# Patient Record
Sex: Female | Born: 1960 | Race: Black or African American | Hispanic: No | Marital: Single | Smoking: Former smoker
Health system: Southern US, Community
[De-identification: ages and names within clinical notes are randomized; demographics above are authoritative.]

## PROBLEM LIST (undated history)

## (undated) DIAGNOSIS — Z906 Acquired absence of other parts of urinary tract: Secondary | ICD-10-CM

## (undated) DIAGNOSIS — F32A Depression, unspecified: Secondary | ICD-10-CM

## (undated) DIAGNOSIS — E785 Hyperlipidemia, unspecified: Secondary | ICD-10-CM

## (undated) DIAGNOSIS — M5136 Other intervertebral disc degeneration, lumbar region: Secondary | ICD-10-CM

## (undated) DIAGNOSIS — E119 Type 2 diabetes mellitus without complications: Secondary | ICD-10-CM

## (undated) DIAGNOSIS — G4700 Insomnia, unspecified: Secondary | ICD-10-CM

## (undated) DIAGNOSIS — I10 Essential (primary) hypertension: Secondary | ICD-10-CM

## (undated) DIAGNOSIS — N951 Menopausal and female climacteric states: Secondary | ICD-10-CM

## (undated) DIAGNOSIS — M51369 Other intervertebral disc degeneration, lumbar region without mention of lumbar back pain or lower extremity pain: Secondary | ICD-10-CM

## (undated) DIAGNOSIS — E669 Obesity, unspecified: Secondary | ICD-10-CM

## (undated) DIAGNOSIS — M549 Dorsalgia, unspecified: Secondary | ICD-10-CM

## (undated) DIAGNOSIS — C801 Malignant (primary) neoplasm, unspecified: Secondary | ICD-10-CM

## (undated) DIAGNOSIS — G629 Polyneuropathy, unspecified: Secondary | ICD-10-CM

## (undated) DIAGNOSIS — N289 Disorder of kidney and ureter, unspecified: Secondary | ICD-10-CM

## (undated) DIAGNOSIS — J45909 Unspecified asthma, uncomplicated: Secondary | ICD-10-CM

## (undated) DIAGNOSIS — F329 Major depressive disorder, single episode, unspecified: Secondary | ICD-10-CM

## (undated) DIAGNOSIS — B351 Tinea unguium: Secondary | ICD-10-CM

## (undated) HISTORY — DX: Dorsalgia, unspecified: M54.9

## (undated) HISTORY — DX: Major depressive disorder, single episode, unspecified: F32.9

## (undated) HISTORY — PX: COSMETIC SURGERY: SHX468

## (undated) HISTORY — PX: JOINT REPLACEMENT: SHX530

## (undated) HISTORY — DX: Other intervertebral disc degeneration, lumbar region without mention of lumbar back pain or lower extremity pain: M51.369

## (undated) HISTORY — DX: Tinea unguium: B35.1

## (undated) HISTORY — DX: Polyneuropathy, unspecified: G62.9

## (undated) HISTORY — DX: Insomnia, unspecified: G47.00

## (undated) HISTORY — PX: SHOULDER SURGERY: SHX246

## (undated) HISTORY — DX: Essential (primary) hypertension: I10

## (undated) HISTORY — DX: Hyperlipidemia, unspecified: E78.5

## (undated) HISTORY — DX: Menopausal and female climacteric states: N95.1

## (undated) HISTORY — DX: Unspecified asthma, uncomplicated: J45.909

## (undated) HISTORY — DX: Other intervertebral disc degeneration, lumbar region: M51.36

## (undated) HISTORY — DX: Acquired absence of other parts of urinary tract: Z90.6

## (undated) HISTORY — DX: Type 2 diabetes mellitus without complications: E11.9

## (undated) HISTORY — PX: LIVER BIOPSY: SHX301

## (undated) HISTORY — DX: Depression, unspecified: F32.A

## (undated) HISTORY — DX: Obesity, unspecified: E66.9

## (undated) HISTORY — DX: Disorder of kidney and ureter, unspecified: N28.9

---

## 2003-11-12 ENCOUNTER — Ambulatory Visit: Payer: Self-pay | Admitting: Pain Medicine

## 2003-12-14 ENCOUNTER — Ambulatory Visit: Payer: Self-pay | Admitting: Pain Medicine

## 2003-12-25 ENCOUNTER — Other Ambulatory Visit: Payer: Self-pay

## 2004-01-08 ENCOUNTER — Inpatient Hospital Stay: Payer: Self-pay | Admitting: Unknown Physician Specialty

## 2004-03-01 ENCOUNTER — Encounter: Payer: Self-pay | Admitting: Physician Assistant

## 2004-03-08 ENCOUNTER — Ambulatory Visit: Payer: Self-pay | Admitting: Pain Medicine

## 2004-03-09 ENCOUNTER — Encounter: Payer: Self-pay | Admitting: Physician Assistant

## 2004-03-21 ENCOUNTER — Ambulatory Visit: Payer: Self-pay | Admitting: Pain Medicine

## 2004-04-06 ENCOUNTER — Encounter: Payer: Self-pay | Admitting: Physician Assistant

## 2004-04-14 ENCOUNTER — Ambulatory Visit: Payer: Self-pay | Admitting: Pain Medicine

## 2004-04-20 ENCOUNTER — Ambulatory Visit: Payer: Self-pay | Admitting: Pain Medicine

## 2004-05-07 ENCOUNTER — Encounter: Payer: Self-pay | Admitting: Physician Assistant

## 2004-05-12 ENCOUNTER — Ambulatory Visit: Payer: Self-pay | Admitting: Pain Medicine

## 2004-05-18 ENCOUNTER — Ambulatory Visit: Payer: Self-pay | Admitting: Pain Medicine

## 2004-06-13 ENCOUNTER — Ambulatory Visit: Payer: Self-pay | Admitting: Unknown Physician Specialty

## 2004-06-14 ENCOUNTER — Ambulatory Visit: Payer: Self-pay | Admitting: Pain Medicine

## 2004-06-22 ENCOUNTER — Ambulatory Visit: Payer: Self-pay | Admitting: Pain Medicine

## 2004-07-12 ENCOUNTER — Ambulatory Visit: Payer: Self-pay | Admitting: Pain Medicine

## 2004-07-18 ENCOUNTER — Ambulatory Visit: Payer: Self-pay | Admitting: Pain Medicine

## 2004-08-11 ENCOUNTER — Ambulatory Visit: Payer: Self-pay | Admitting: Pain Medicine

## 2004-08-22 ENCOUNTER — Ambulatory Visit: Payer: Self-pay | Admitting: Pain Medicine

## 2004-09-06 ENCOUNTER — Ambulatory Visit: Payer: Self-pay | Admitting: Pain Medicine

## 2004-09-28 ENCOUNTER — Ambulatory Visit: Payer: Self-pay | Admitting: Pain Medicine

## 2004-11-08 ENCOUNTER — Ambulatory Visit: Payer: Self-pay | Admitting: Pain Medicine

## 2004-11-14 ENCOUNTER — Ambulatory Visit: Payer: Self-pay | Admitting: Pain Medicine

## 2004-11-15 ENCOUNTER — Encounter: Payer: Self-pay | Admitting: Pain Medicine

## 2004-11-16 ENCOUNTER — Ambulatory Visit: Payer: Self-pay | Admitting: Pain Medicine

## 2004-12-06 ENCOUNTER — Ambulatory Visit: Payer: Self-pay | Admitting: Pain Medicine

## 2004-12-12 ENCOUNTER — Ambulatory Visit: Payer: Self-pay | Admitting: Pain Medicine

## 2004-12-15 ENCOUNTER — Encounter: Payer: Self-pay | Admitting: Pain Medicine

## 2005-01-03 ENCOUNTER — Ambulatory Visit: Payer: Self-pay | Admitting: Pain Medicine

## 2005-01-09 ENCOUNTER — Ambulatory Visit: Payer: Self-pay | Admitting: Pain Medicine

## 2005-01-10 ENCOUNTER — Encounter: Payer: Self-pay | Admitting: Pain Medicine

## 2005-02-06 ENCOUNTER — Encounter: Payer: Self-pay | Admitting: Pain Medicine

## 2005-02-16 ENCOUNTER — Ambulatory Visit: Payer: Self-pay | Admitting: Pain Medicine

## 2005-02-27 ENCOUNTER — Ambulatory Visit: Payer: Self-pay | Admitting: Pain Medicine

## 2005-03-14 ENCOUNTER — Ambulatory Visit: Payer: Self-pay | Admitting: Pain Medicine

## 2005-04-24 ENCOUNTER — Ambulatory Visit: Payer: Self-pay | Admitting: Pain Medicine

## 2005-05-04 ENCOUNTER — Ambulatory Visit: Payer: Self-pay | Admitting: Pain Medicine

## 2005-05-22 ENCOUNTER — Ambulatory Visit: Payer: Self-pay | Admitting: Pain Medicine

## 2005-06-01 ENCOUNTER — Ambulatory Visit: Payer: Self-pay | Admitting: Pain Medicine

## 2005-06-14 ENCOUNTER — Ambulatory Visit: Payer: Self-pay | Admitting: Pain Medicine

## 2005-06-24 ENCOUNTER — Ambulatory Visit: Payer: Self-pay | Admitting: Unknown Physician Specialty

## 2005-06-29 ENCOUNTER — Ambulatory Visit: Payer: Self-pay | Admitting: Pain Medicine

## 2005-07-24 ENCOUNTER — Ambulatory Visit: Payer: Self-pay | Admitting: Unknown Physician Specialty

## 2005-08-01 ENCOUNTER — Ambulatory Visit: Payer: Self-pay | Admitting: Pain Medicine

## 2005-08-21 ENCOUNTER — Ambulatory Visit: Payer: Self-pay | Admitting: Pain Medicine

## 2005-09-26 ENCOUNTER — Ambulatory Visit: Payer: Self-pay | Admitting: Pain Medicine

## 2005-10-26 ENCOUNTER — Ambulatory Visit: Payer: Self-pay | Admitting: Pain Medicine

## 2005-11-09 ENCOUNTER — Other Ambulatory Visit: Payer: Self-pay

## 2005-11-23 ENCOUNTER — Observation Stay: Payer: Self-pay | Admitting: Unknown Physician Specialty

## 2006-01-02 ENCOUNTER — Ambulatory Visit: Payer: Self-pay | Admitting: Pain Medicine

## 2006-01-18 ENCOUNTER — Ambulatory Visit: Payer: Self-pay | Admitting: Pain Medicine

## 2006-02-07 ENCOUNTER — Ambulatory Visit: Payer: Self-pay | Admitting: Pain Medicine

## 2006-02-12 ENCOUNTER — Encounter: Payer: Self-pay | Admitting: Unknown Physician Specialty

## 2006-02-15 ENCOUNTER — Ambulatory Visit: Payer: Self-pay | Admitting: Family Medicine

## 2006-02-28 ENCOUNTER — Ambulatory Visit: Payer: Self-pay | Admitting: Pain Medicine

## 2006-03-09 ENCOUNTER — Encounter: Payer: Self-pay | Admitting: Unknown Physician Specialty

## 2006-03-15 ENCOUNTER — Ambulatory Visit: Payer: Self-pay | Admitting: Pain Medicine

## 2006-04-07 ENCOUNTER — Encounter: Payer: Self-pay | Admitting: Unknown Physician Specialty

## 2006-04-11 ENCOUNTER — Ambulatory Visit: Payer: Self-pay | Admitting: Pain Medicine

## 2006-05-15 ENCOUNTER — Ambulatory Visit: Payer: Self-pay | Admitting: Pain Medicine

## 2006-05-28 ENCOUNTER — Ambulatory Visit: Payer: Self-pay | Admitting: Pain Medicine

## 2006-06-20 ENCOUNTER — Ambulatory Visit: Payer: Self-pay | Admitting: Pain Medicine

## 2006-07-24 ENCOUNTER — Ambulatory Visit: Payer: Self-pay | Admitting: Pain Medicine

## 2006-08-01 ENCOUNTER — Ambulatory Visit: Payer: Self-pay | Admitting: Pain Medicine

## 2006-08-13 ENCOUNTER — Ambulatory Visit: Payer: Self-pay | Admitting: Pain Medicine

## 2006-08-23 ENCOUNTER — Ambulatory Visit: Payer: Self-pay | Admitting: Pain Medicine

## 2006-09-10 ENCOUNTER — Ambulatory Visit: Payer: Self-pay | Admitting: Pain Medicine

## 2006-09-19 ENCOUNTER — Ambulatory Visit: Payer: Self-pay | Admitting: Pain Medicine

## 2006-10-02 ENCOUNTER — Ambulatory Visit: Payer: Self-pay | Admitting: Pain Medicine

## 2006-10-30 ENCOUNTER — Ambulatory Visit: Payer: Self-pay | Admitting: Unknown Physician Specialty

## 2006-10-30 ENCOUNTER — Other Ambulatory Visit: Payer: Self-pay

## 2006-10-31 ENCOUNTER — Ambulatory Visit: Payer: Self-pay | Admitting: Pain Medicine

## 2006-11-05 ENCOUNTER — Ambulatory Visit: Payer: Self-pay | Admitting: Unknown Physician Specialty

## 2006-11-10 ENCOUNTER — Other Ambulatory Visit: Payer: Self-pay

## 2006-11-10 ENCOUNTER — Emergency Department: Payer: Self-pay | Admitting: Emergency Medicine

## 2006-11-29 ENCOUNTER — Ambulatory Visit: Payer: Self-pay | Admitting: Pain Medicine

## 2006-12-27 ENCOUNTER — Ambulatory Visit: Payer: Self-pay | Admitting: Pain Medicine

## 2007-01-09 ENCOUNTER — Ambulatory Visit: Payer: Self-pay | Admitting: Pain Medicine

## 2007-01-28 ENCOUNTER — Ambulatory Visit: Payer: Self-pay | Admitting: Pain Medicine

## 2007-02-28 ENCOUNTER — Ambulatory Visit: Payer: Self-pay | Admitting: Pain Medicine

## 2007-03-11 ENCOUNTER — Ambulatory Visit: Payer: Self-pay | Admitting: Pain Medicine

## 2007-03-21 ENCOUNTER — Ambulatory Visit: Payer: Self-pay | Admitting: Family Medicine

## 2007-03-26 ENCOUNTER — Ambulatory Visit: Payer: Self-pay | Admitting: Pain Medicine

## 2007-04-01 ENCOUNTER — Ambulatory Visit: Payer: Self-pay | Admitting: Pain Medicine

## 2007-04-30 ENCOUNTER — Ambulatory Visit: Payer: Self-pay | Admitting: Pain Medicine

## 2007-06-05 ENCOUNTER — Ambulatory Visit: Payer: Self-pay | Admitting: Pain Medicine

## 2007-07-02 ENCOUNTER — Ambulatory Visit: Payer: Self-pay | Admitting: Pain Medicine

## 2007-07-17 ENCOUNTER — Ambulatory Visit: Payer: Self-pay | Admitting: Pain Medicine

## 2007-08-27 ENCOUNTER — Ambulatory Visit: Payer: Self-pay | Admitting: Pain Medicine

## 2007-09-26 ENCOUNTER — Ambulatory Visit: Payer: Self-pay | Admitting: Pain Medicine

## 2007-10-02 ENCOUNTER — Ambulatory Visit: Payer: Self-pay | Admitting: Pain Medicine

## 2007-10-31 ENCOUNTER — Ambulatory Visit: Payer: Self-pay | Admitting: Pain Medicine

## 2007-11-20 ENCOUNTER — Ambulatory Visit: Payer: Self-pay | Admitting: Pain Medicine

## 2007-12-31 ENCOUNTER — Ambulatory Visit: Payer: Self-pay | Admitting: Pain Medicine

## 2008-01-27 ENCOUNTER — Ambulatory Visit: Payer: Self-pay | Admitting: Pain Medicine

## 2008-01-28 ENCOUNTER — Inpatient Hospital Stay: Payer: Self-pay | Admitting: Internal Medicine

## 2008-02-27 ENCOUNTER — Ambulatory Visit: Payer: Self-pay | Admitting: Pain Medicine

## 2008-03-02 ENCOUNTER — Ambulatory Visit: Payer: Self-pay | Admitting: Pain Medicine

## 2008-03-31 ENCOUNTER — Ambulatory Visit: Payer: Self-pay | Admitting: Pain Medicine

## 2008-04-07 ENCOUNTER — Ambulatory Visit: Payer: Self-pay | Admitting: Internal Medicine

## 2008-04-28 ENCOUNTER — Ambulatory Visit: Payer: Self-pay | Admitting: Pain Medicine

## 2008-05-06 ENCOUNTER — Ambulatory Visit: Payer: Self-pay | Admitting: Pain Medicine

## 2008-05-28 ENCOUNTER — Ambulatory Visit: Payer: Self-pay | Admitting: Pain Medicine

## 2008-06-25 ENCOUNTER — Ambulatory Visit: Payer: Self-pay | Admitting: Pain Medicine

## 2008-07-27 ENCOUNTER — Ambulatory Visit: Payer: Self-pay | Admitting: Pain Medicine

## 2008-08-18 ENCOUNTER — Ambulatory Visit: Payer: Self-pay | Admitting: Orthopedic Surgery

## 2008-08-25 ENCOUNTER — Ambulatory Visit: Payer: Self-pay | Admitting: Pain Medicine

## 2008-09-02 ENCOUNTER — Ambulatory Visit: Payer: Self-pay | Admitting: Pain Medicine

## 2008-09-24 ENCOUNTER — Ambulatory Visit: Payer: Self-pay | Admitting: Pain Medicine

## 2008-10-22 ENCOUNTER — Ambulatory Visit: Payer: Self-pay | Admitting: Pain Medicine

## 2008-11-04 ENCOUNTER — Ambulatory Visit: Payer: Self-pay | Admitting: Pain Medicine

## 2008-11-19 ENCOUNTER — Ambulatory Visit: Payer: Self-pay | Admitting: Pain Medicine

## 2008-12-22 ENCOUNTER — Ambulatory Visit: Payer: Self-pay | Admitting: Pain Medicine

## 2009-01-26 ENCOUNTER — Ambulatory Visit: Payer: Self-pay | Admitting: Pain Medicine

## 2009-02-23 ENCOUNTER — Ambulatory Visit: Payer: Self-pay | Admitting: Pain Medicine

## 2009-03-17 ENCOUNTER — Ambulatory Visit: Payer: Self-pay | Admitting: Pain Medicine

## 2009-03-25 ENCOUNTER — Ambulatory Visit: Payer: Self-pay | Admitting: Pain Medicine

## 2009-03-31 ENCOUNTER — Ambulatory Visit: Payer: Self-pay | Admitting: Pain Medicine

## 2009-04-09 ENCOUNTER — Ambulatory Visit: Payer: Self-pay | Admitting: Internal Medicine

## 2009-04-21 ENCOUNTER — Ambulatory Visit: Payer: Self-pay | Admitting: Pain Medicine

## 2009-04-28 ENCOUNTER — Ambulatory Visit: Payer: Self-pay | Admitting: Gastroenterology

## 2009-05-20 ENCOUNTER — Ambulatory Visit: Payer: Self-pay | Admitting: Pain Medicine

## 2009-06-15 ENCOUNTER — Ambulatory Visit: Payer: Self-pay | Admitting: Pain Medicine

## 2009-06-23 ENCOUNTER — Ambulatory Visit: Payer: Self-pay | Admitting: Pain Medicine

## 2009-07-15 ENCOUNTER — Ambulatory Visit: Payer: Self-pay | Admitting: Pain Medicine

## 2009-08-17 ENCOUNTER — Ambulatory Visit: Payer: Self-pay | Admitting: Pain Medicine

## 2009-09-16 ENCOUNTER — Ambulatory Visit: Payer: Self-pay | Admitting: Pain Medicine

## 2009-10-18 ENCOUNTER — Ambulatory Visit: Payer: Self-pay | Admitting: Pain Medicine

## 2009-11-17 ENCOUNTER — Ambulatory Visit: Payer: Self-pay | Admitting: Pain Medicine

## 2009-12-16 ENCOUNTER — Ambulatory Visit: Payer: Self-pay | Admitting: Pain Medicine

## 2010-01-11 ENCOUNTER — Ambulatory Visit: Payer: Self-pay | Admitting: Pain Medicine

## 2010-01-19 ENCOUNTER — Ambulatory Visit: Payer: Self-pay | Admitting: Pain Medicine

## 2010-02-10 ENCOUNTER — Ambulatory Visit: Payer: Self-pay | Admitting: Pain Medicine

## 2010-02-28 ENCOUNTER — Ambulatory Visit: Payer: Self-pay | Admitting: Pain Medicine

## 2010-03-15 ENCOUNTER — Ambulatory Visit: Payer: Self-pay | Admitting: Pain Medicine

## 2010-04-11 ENCOUNTER — Ambulatory Visit: Payer: Self-pay | Admitting: Internal Medicine

## 2010-04-12 ENCOUNTER — Ambulatory Visit: Payer: Self-pay | Admitting: Pain Medicine

## 2010-05-12 ENCOUNTER — Ambulatory Visit: Payer: Self-pay | Admitting: Pain Medicine

## 2010-06-07 ENCOUNTER — Ambulatory Visit: Payer: Self-pay | Admitting: Pain Medicine

## 2010-07-11 ENCOUNTER — Ambulatory Visit: Payer: Self-pay | Admitting: Pain Medicine

## 2010-08-09 ENCOUNTER — Ambulatory Visit: Payer: Self-pay | Admitting: Pain Medicine

## 2010-09-08 ENCOUNTER — Ambulatory Visit: Payer: Self-pay | Admitting: Pain Medicine

## 2010-10-06 ENCOUNTER — Ambulatory Visit: Payer: Self-pay | Admitting: Pain Medicine

## 2010-11-02 ENCOUNTER — Ambulatory Visit: Payer: Self-pay | Admitting: Pain Medicine

## 2010-12-01 ENCOUNTER — Ambulatory Visit: Payer: Self-pay | Admitting: Pain Medicine

## 2010-12-14 ENCOUNTER — Ambulatory Visit: Payer: Self-pay | Admitting: Pain Medicine

## 2011-01-05 ENCOUNTER — Ambulatory Visit: Payer: Self-pay | Admitting: Pain Medicine

## 2011-02-08 ENCOUNTER — Ambulatory Visit: Payer: Self-pay | Admitting: Pain Medicine

## 2011-03-09 ENCOUNTER — Ambulatory Visit: Payer: Self-pay | Admitting: Pain Medicine

## 2011-03-29 ENCOUNTER — Ambulatory Visit: Payer: Self-pay | Admitting: Pain Medicine

## 2011-04-19 ENCOUNTER — Ambulatory Visit: Payer: Self-pay | Admitting: Pain Medicine

## 2011-04-28 ENCOUNTER — Ambulatory Visit: Payer: Self-pay | Admitting: Pain Medicine

## 2011-05-09 ENCOUNTER — Ambulatory Visit: Payer: Self-pay | Admitting: Pain Medicine

## 2011-05-22 ENCOUNTER — Ambulatory Visit: Payer: Self-pay | Admitting: Pain Medicine

## 2011-05-24 ENCOUNTER — Ambulatory Visit: Payer: Self-pay | Admitting: Internal Medicine

## 2011-06-08 ENCOUNTER — Ambulatory Visit: Payer: Self-pay | Admitting: Pain Medicine

## 2011-07-06 ENCOUNTER — Ambulatory Visit: Payer: Self-pay | Admitting: Pain Medicine

## 2011-08-01 ENCOUNTER — Ambulatory Visit: Payer: Self-pay | Admitting: Pain Medicine

## 2011-09-05 ENCOUNTER — Ambulatory Visit: Payer: Self-pay | Admitting: Pain Medicine

## 2011-10-05 ENCOUNTER — Ambulatory Visit: Payer: Self-pay | Admitting: Pain Medicine

## 2011-11-06 ENCOUNTER — Ambulatory Visit: Payer: Self-pay | Admitting: Pain Medicine

## 2011-12-05 ENCOUNTER — Ambulatory Visit: Payer: Self-pay | Admitting: Pain Medicine

## 2011-12-07 ENCOUNTER — Ambulatory Visit: Payer: Self-pay | Admitting: Family Medicine

## 2012-01-01 ENCOUNTER — Ambulatory Visit: Payer: Self-pay | Admitting: Pain Medicine

## 2012-01-10 ENCOUNTER — Encounter: Payer: Self-pay | Admitting: Internal Medicine

## 2012-02-06 ENCOUNTER — Ambulatory Visit: Payer: Self-pay | Admitting: Pain Medicine

## 2012-02-07 ENCOUNTER — Encounter: Payer: Self-pay | Admitting: Internal Medicine

## 2012-02-19 ENCOUNTER — Ambulatory Visit: Payer: Self-pay | Admitting: Pain Medicine

## 2012-03-07 ENCOUNTER — Ambulatory Visit: Payer: Self-pay | Admitting: Pain Medicine

## 2012-03-09 ENCOUNTER — Encounter: Payer: Self-pay | Admitting: Internal Medicine

## 2012-04-03 ENCOUNTER — Ambulatory Visit: Payer: Self-pay | Admitting: Pain Medicine

## 2012-04-30 ENCOUNTER — Ambulatory Visit: Payer: Self-pay | Admitting: Pain Medicine

## 2012-05-27 ENCOUNTER — Ambulatory Visit: Payer: Self-pay | Admitting: Internal Medicine

## 2012-05-29 ENCOUNTER — Ambulatory Visit: Payer: Self-pay | Admitting: Pain Medicine

## 2012-05-30 DIAGNOSIS — N62 Hypertrophy of breast: Secondary | ICD-10-CM | POA: Insufficient documentation

## 2012-06-04 ENCOUNTER — Ambulatory Visit: Payer: Self-pay | Admitting: Pain Medicine

## 2012-07-04 ENCOUNTER — Ambulatory Visit: Payer: Self-pay | Admitting: Pain Medicine

## 2012-07-30 ENCOUNTER — Ambulatory Visit: Payer: Self-pay | Admitting: Pain Medicine

## 2012-08-19 ENCOUNTER — Encounter: Payer: Self-pay | Admitting: Internal Medicine

## 2012-09-03 ENCOUNTER — Ambulatory Visit: Payer: Self-pay | Admitting: Pain Medicine

## 2012-09-06 ENCOUNTER — Encounter: Payer: Self-pay | Admitting: Internal Medicine

## 2012-10-03 ENCOUNTER — Ambulatory Visit: Payer: Self-pay | Admitting: Pain Medicine

## 2012-10-30 ENCOUNTER — Ambulatory Visit: Payer: Self-pay | Admitting: Pain Medicine

## 2012-11-25 ENCOUNTER — Ambulatory Visit: Payer: Self-pay | Admitting: Pain Medicine

## 2012-12-24 ENCOUNTER — Ambulatory Visit: Payer: Self-pay | Admitting: Pain Medicine

## 2013-01-28 ENCOUNTER — Ambulatory Visit: Payer: Self-pay | Admitting: Pain Medicine

## 2013-02-18 ENCOUNTER — Encounter: Payer: Self-pay | Admitting: Orthopedic Surgery

## 2013-02-21 ENCOUNTER — Emergency Department: Payer: Self-pay | Admitting: Emergency Medicine

## 2013-02-25 ENCOUNTER — Ambulatory Visit: Payer: Self-pay | Admitting: Pain Medicine

## 2013-02-27 ENCOUNTER — Emergency Department: Payer: Self-pay | Admitting: Emergency Medicine

## 2013-02-27 LAB — CBC
HCT: 38.5 % (ref 35.0–47.0)
HGB: 12.5 g/dL (ref 12.0–16.0)
MCH: 30.4 pg (ref 26.0–34.0)
MCHC: 32.4 g/dL (ref 32.0–36.0)
MCV: 94 fL (ref 80–100)
PLATELETS: 121 10*3/uL — AB (ref 150–440)
RBC: 4.11 10*6/uL (ref 3.80–5.20)
RDW: 13.2 % (ref 11.5–14.5)
WBC: 8.1 10*3/uL (ref 3.6–11.0)

## 2013-02-27 LAB — URINALYSIS, COMPLETE
Bilirubin,UR: NEGATIVE
Glucose,UR: NEGATIVE mg/dL (ref 0–75)
Ketone: NEGATIVE
Leukocyte Esterase: NEGATIVE
Nitrite: NEGATIVE
Ph: 5 (ref 4.5–8.0)
Protein: NEGATIVE
RBC,UR: 1 /HPF (ref 0–5)
Specific Gravity: 1.014 (ref 1.003–1.030)
Squamous Epithelial: <1
WBC UR: 9 /HPF (ref 0–5)

## 2013-02-27 LAB — COMPREHENSIVE METABOLIC PANEL
ALK PHOS: 83 U/L
AST: 15 U/L (ref 15–37)
Albumin: 3.8 g/dL (ref 3.4–5.0)
Anion Gap: 5 — ABNORMAL LOW (ref 7–16)
BUN: 14 mg/dL (ref 7–18)
Bilirubin,Total: 0.3 mg/dL (ref 0.2–1.0)
CALCIUM: 9.1 mg/dL (ref 8.5–10.1)
Chloride: 102 mmol/L (ref 98–107)
Co2: 31 mmol/L (ref 21–32)
Creatinine: 1.1 mg/dL (ref 0.60–1.30)
EGFR (African American): >60
EGFR (Non-African Amer.): 57 — ABNORMAL LOW
Glucose: 142 mg/dL — ABNORMAL HIGH (ref 65–99)
OSMOLALITY: 279 (ref 275–301)
Potassium: 3.3 mmol/L — ABNORMAL LOW (ref 3.5–5.1)
SGPT (ALT): 22 U/L (ref 12–78)
SODIUM: 138 mmol/L (ref 136–145)
Total Protein: 7.9 g/dL (ref 6.4–8.2)

## 2013-02-28 LAB — DRUG SCREEN, URINE
AMPHETAMINES, UR SCREEN: NEGATIVE (ref ?–1000)
Barbiturates, Ur Screen: NEGATIVE (ref ?–200)
Benzodiazepine, Ur Scrn: POSITIVE (ref ?–200)
CANNABINOID 50 NG, UR ~~LOC~~: NEGATIVE (ref ?–50)
COCAINE METABOLITE, UR ~~LOC~~: NEGATIVE (ref ?–300)
MDMA (Ecstasy)Ur Screen: POSITIVE (ref ?–500)
Methadone, Ur Screen: NEGATIVE (ref ?–300)
Opiate, Ur Screen: POSITIVE (ref ?–300)
PHENCYCLIDINE (PCP) UR S: NEGATIVE (ref ?–25)
TRICYCLIC, UR SCREEN: NEGATIVE (ref ?–1000)

## 2013-02-28 LAB — TROPONIN I

## 2013-03-09 ENCOUNTER — Encounter: Payer: Self-pay | Admitting: Orthopedic Surgery

## 2013-04-01 ENCOUNTER — Ambulatory Visit: Payer: Self-pay | Admitting: Pain Medicine

## 2013-04-07 ENCOUNTER — Ambulatory Visit: Payer: Self-pay | Admitting: Pain Medicine

## 2013-04-29 ENCOUNTER — Ambulatory Visit: Payer: Self-pay | Admitting: Pain Medicine

## 2013-05-29 ENCOUNTER — Ambulatory Visit: Payer: Self-pay | Admitting: Pain Medicine

## 2013-06-13 ENCOUNTER — Ambulatory Visit: Payer: Self-pay | Admitting: Internal Medicine

## 2013-06-26 ENCOUNTER — Ambulatory Visit: Payer: Self-pay | Admitting: Pain Medicine

## 2013-07-29 ENCOUNTER — Ambulatory Visit: Payer: Self-pay | Admitting: Pain Medicine

## 2013-08-28 ENCOUNTER — Ambulatory Visit: Payer: Self-pay | Admitting: Pain Medicine

## 2013-09-25 ENCOUNTER — Ambulatory Visit: Payer: Self-pay | Admitting: Pain Medicine

## 2013-10-28 ENCOUNTER — Ambulatory Visit: Payer: Self-pay | Admitting: Pain Medicine

## 2013-11-25 ENCOUNTER — Ambulatory Visit: Payer: Self-pay | Admitting: Pain Medicine

## 2013-12-15 HISTORY — PX: REDUCTION MAMMAPLASTY: SUR839

## 2013-12-25 ENCOUNTER — Ambulatory Visit: Payer: Self-pay | Admitting: Pain Medicine

## 2014-01-27 ENCOUNTER — Ambulatory Visit: Payer: Self-pay | Admitting: Pain Medicine

## 2014-02-26 ENCOUNTER — Ambulatory Visit: Payer: Self-pay | Admitting: Pain Medicine

## 2014-03-25 ENCOUNTER — Ambulatory Visit: Payer: Self-pay | Admitting: Pain Medicine

## 2014-04-21 ENCOUNTER — Ambulatory Visit: Payer: Self-pay | Admitting: Pain Medicine

## 2014-05-22 ENCOUNTER — Other Ambulatory Visit: Payer: Self-pay | Admitting: Internal Medicine

## 2014-05-22 DIAGNOSIS — Z1231 Encounter for screening mammogram for malignant neoplasm of breast: Secondary | ICD-10-CM

## 2014-05-26 ENCOUNTER — Ambulatory Visit
Admit: 2014-05-26 | Disposition: A | Discharge status: Home / Self Care | Payer: Self-pay | Attending: Pain Medicine | Admitting: Pain Medicine

## 2014-06-15 ENCOUNTER — Ambulatory Visit
Admission: RE | Admit: 2014-06-15 | Discharge: 2014-06-15 | Disposition: A | Discharge status: Home / Self Care | Payer: Medicare Other | Source: Ambulatory Visit | Attending: Internal Medicine | Admitting: Internal Medicine

## 2014-06-15 DIAGNOSIS — Z1231 Encounter for screening mammogram for malignant neoplasm of breast: Secondary | ICD-10-CM | POA: Insufficient documentation

## 2014-06-24 ENCOUNTER — Other Ambulatory Visit: Payer: Self-pay | Admitting: Pain Medicine

## 2014-06-24 ENCOUNTER — Telehealth: Payer: Self-pay | Admitting: *Deleted

## 2014-06-24 ENCOUNTER — Encounter: Payer: Self-pay | Admitting: Pain Medicine

## 2014-06-24 ENCOUNTER — Ambulatory Visit: Payer: Medicare Other | Attending: Pain Medicine | Admitting: Pain Medicine

## 2014-06-24 VITALS — BP 123/58 | HR 74 | Temp 98.4°F | Resp 14 | Ht 65.0 in | Wt 289.0 lb

## 2014-06-24 DIAGNOSIS — G5701 Lesion of sciatic nerve, right lower limb: Secondary | ICD-10-CM

## 2014-06-24 DIAGNOSIS — M5030 Other cervical disc degeneration, unspecified cervical region: Secondary | ICD-10-CM | POA: Insufficient documentation

## 2014-06-24 DIAGNOSIS — G90521 Complex regional pain syndrome I of right lower limb: Secondary | ICD-10-CM

## 2014-06-24 DIAGNOSIS — M5136 Other intervertebral disc degeneration, lumbar region: Secondary | ICD-10-CM | POA: Insufficient documentation

## 2014-06-24 DIAGNOSIS — M79604 Pain in right leg: Secondary | ICD-10-CM | POA: Diagnosis present

## 2014-06-24 DIAGNOSIS — M79605 Pain in left leg: Secondary | ICD-10-CM | POA: Diagnosis present

## 2014-06-24 DIAGNOSIS — E1340 Other specified diabetes mellitus with diabetic neuropathy, unspecified: Secondary | ICD-10-CM | POA: Insufficient documentation

## 2014-06-24 DIAGNOSIS — M47816 Spondylosis without myelopathy or radiculopathy, lumbar region: Secondary | ICD-10-CM

## 2014-06-24 DIAGNOSIS — E119 Type 2 diabetes mellitus without complications: Secondary | ICD-10-CM | POA: Insufficient documentation

## 2014-06-24 DIAGNOSIS — M5481 Occipital neuralgia: Secondary | ICD-10-CM

## 2014-06-24 DIAGNOSIS — G5700 Lesion of sciatic nerve, unspecified lower limb: Secondary | ICD-10-CM | POA: Insufficient documentation

## 2014-06-24 DIAGNOSIS — M545 Low back pain, unspecified: Secondary | ICD-10-CM | POA: Insufficient documentation

## 2014-06-24 DIAGNOSIS — M51369 Other intervertebral disc degeneration, lumbar region without mention of lumbar back pain or lower extremity pain: Secondary | ICD-10-CM | POA: Insufficient documentation

## 2014-06-24 MED ORDER — CEFAZOLIN SODIUM 1 G IJ SOLR
INTRAMUSCULAR | Status: AC
  Filled 2014-06-24: qty 10

## 2014-06-24 MED ORDER — TAPENTADOL HCL ER 50 MG PO TB12: ORAL_TABLET | ORAL | Status: DC

## 2014-06-24 MED ORDER — TIZANIDINE HCL 2 MG PO CAPS: ORAL_CAPSULE | ORAL | Status: DC

## 2014-06-24 MED ORDER — MIDAZOLAM HCL 5 MG/5ML IJ SOLN
INTRAMUSCULAR | Status: AC
  Administered 2014-06-24: 13:00:00
  Filled 2014-06-24: qty 5

## 2014-06-24 MED ORDER — TAPENTADOL HCL ER 100 MG PO TB12: ORAL_TABLET | ORAL | Status: DC

## 2014-06-24 MED ORDER — TRIAMCINOLONE ACETONIDE 40 MG/ML IJ SUSP
INTRAMUSCULAR | Status: AC
  Administered 2014-06-24: 13:00:00
  Filled 2014-06-24: qty 1

## 2014-06-24 MED ORDER — BUPIVACAINE HCL (PF) 0.25 % IJ SOLN
INTRAMUSCULAR | Status: AC
  Administered 2014-06-24: 13:00:00
  Filled 2014-06-24: qty 30

## 2014-06-24 MED ORDER — GABAPENTIN 600 MG PO TABS: ORAL_TABLET | ORAL | Status: DC

## 2014-06-24 MED ORDER — DOXYCYCLINE HYCLATE 100 MG PO TABS: 100.0000 mg | ORAL_TABLET | Freq: Two times a day (BID) | ORAL | Status: DC

## 2014-06-24 MED ORDER — OXYCODONE HCL 10 MG PO TABS: ORAL_TABLET | ORAL | Status: DC

## 2014-06-24 MED ORDER — FENTANYL CITRATE (PF) 100 MCG/2ML IJ SOLN
INTRAMUSCULAR | Status: AC
  Administered 2014-06-24: 100 ug via INTRAVENOUS
  Filled 2014-06-24: qty 2

## 2014-06-24 MED ORDER — ORPHENADRINE CITRATE 30 MG/ML IJ SOLN
INTRAMUSCULAR | Status: AC
  Administered 2014-06-24: 13:00:00
  Filled 2014-06-24: qty 2

## 2014-06-24 NOTE — Patient Instructions (Addendum)
Continue present medications and antibioticcs  F/U PCP for evaliation of  BP and general medical  condition.  F/U surgical evaluation.  F/U nrurological evaluation.  May consider radiofrequency rhizolysis or intraspinal procedures pending response to present treatment and F/U evaluation.  Patient to call [facility] Management Center should patient have concerns prior to scheduled return appointment.  Pain Management Discharge Instructions  General Discharge Instructions :  If you need to reach your doctor call: Monday-Friday 8:00 am - 4:00 pm at [phone] or toll free [phone].  After clinic hours [phone] to have operator reach doctor.  Bring all of your medication bottles to all your appointments in the pain clinic.  To cancel or reschedule your appointment with Pain Management please remember to call 24 hours in advance to avoid a fee.  Refer to the educational materials which you have been given on: General Risks, I had my Procedure. Discharge Instructions, Post Sedation.  Post Procedure Instructions:  The drugs you were given will stay in your system until tomorrow, so for the next 24 hours you should not drive, make any legal decisions or drink any alcoholic beverages.  You may eat anything you prefer, but it is better to start with liquids then soups and crackers, and gradually work up to solid foods.  Please notify your doctor immediately if you have any unusual bleeding, trouble breathing or pain that is not related to your normal pain.  Depending on the type of procedure that was done, some parts of your body may feel week and/or numb.  This usually clears up by tonight or the next day.  Walk with the use of an assistive device or accompanied by an adult for the 24 hours.  You may use ice on the affected area for the first 24 hours.  Put ice in a Ziploc bag and cover with a towel and place against area 15 minutes on 15 minutes off.  You may switch to heat after 24  hours.GENERAL RISKS AND COMPLICATIONS  What are the risk, side effects and possible complications? Generally speaking, most procedures are safe.  However, with any procedure there are risks, side effects, and the possibility of complications.  The risks and complications are dependent upon the sites that are lesioned, or the type of nerve block to be performed.  The closer the procedure is to the spine, the more serious the risks are.  Great care is taken when placing the radio frequency needles, block needles or lesioning probes, but sometimes complications can occur. 1. Infection: Any time there is an injection through the skin, there is a risk of infection.  This is why sterile conditions are used for these blocks.  There are four possible types of infection. 1. Localized skin infection. 2. Central Nervous System Infection-This can be in the form of Meningitis, which can be deadly. 3. Epidural Infections-This can be in the form of an epidural abscess, which can cause pressure inside of the spine, causing compression of the spinal cord with subsequent paralysis. This would require an emergency surgery to decompress, and there are no guarantees that the patient would recover from the paralysis. 4. Discitis-This is an infection of the intervertebral discs.  It occurs in about 1% of discography procedures.  It is difficult to treat and it may lead to surgery.        2. Pain: the needles have to go through skin and soft tissues, will cause soreness.       3. Damage to internal structures:  The nerves to be lesioned may be near blood vessels or    other nerves which can be potentially damaged.       4. Bleeding: Bleeding is more common if the patient is taking blood thinners such as  aspirin, Coumadin, Ticiid, Plavix, etc., or if he/she have some genetic predisposition  such as hemophilia. Bleeding into the spinal canal can cause compression of the spinal  cord with subsequent paralysis.  This would  require an emergency surgery to  decompress and there are no guarantees that the patient would recover from the  paralysis.       5. Pneumothorax:  Puncturing of a lung is a possibility, every time a needle is introduced in  the area of the chest or upper back.  Pneumothorax refers to free air around the  collapsed lung(s), inside of the thoracic cavity (chest cavity).  Another two possible  complications related to a similar event would include: Hemothorax and Chylothorax.   These are variations of the Pneumothorax, where instead of air around the collapsed  lung(s), you may have blood or chyle, respectively.       6. Spinal headaches: They may occur with any procedures in the area of the spine.       7. Persistent CSF (Cerebro-Spinal Fluid) leakage: This is a rare problem, but may occur  with prolonged intrathecal or epidural catheters either due to the formation of a fistulous  track or a dural tear.       8. Nerve damage: By working so close to the spinal cord, there is always a possibility of  nerve damage, which could be as serious as a permanent spinal cord injury with  paralysis.       9. Death:  Although rare, severe deadly allergic reactions known as "Anaphylactic  reaction" can occur to any of the medications used.      10. Worsening of the symptoms:  We can always make thing worse.  What are the chances of something like this happening? Chances of any of this occuring are extremely low.  By statistics, you have more of a chance of getting killed in a motor vehicle accident: while driving to the hospital than any of the above occurring .  Nevertheless, you should be aware that they are possibilities.  In general, it is similar to taking a shower.  Everybody knows that you can slip, hit your head and get killed.  Does that mean that you should not shower again?  Nevertheless always keep in mind that statistics do not mean anything if you happen to be on the wrong side of them.  Even if a procedure  has a 1 (one) in a 1,000,000 (million) chance of going wrong, it you happen to be that one..Also, keep in mind that by statistics, you have more of a chance of having something go wrong when taking medications.  Who should not have this procedure? If you are on a blood thinning medication (e.g. Coumadin, Plavix, see list of "Blood Thinners"), or if you have an active infection going on, you should not have the procedure.  If you are taking any blood thinners, please inform your physician.  How should I prepare for this procedure?  Do not eat or drink anything at least six hours prior to the procedure.  Bring a driver with you .  It cannot be a taxi.  Come accompanied by an adult that can drive you back, and that is strong enough to help  you if your legs get weak or numb from the local anesthetic.  Take all of your medicines the morning of the procedure with just enough water to swallow them.  If you have diabetes, make sure that you are scheduled to have your procedure done first thing in the morning, whenever possible.  If you have diabetes, take only half of your insulin dose and notify our nurse that you have done so as soon as you arrive at the clinic.  If you are diabetic, but only take blood sugar pills (oral hypoglycemic), then do not take them on the morning of your procedure.  You may take them after you have had the procedure.  Do not take aspirin or any aspirin-containing medications, at least eleven (11) days prior to the procedure.  They may prolong bleeding.  Wear loose fitting clothing that may be easy to take off and that you would not mind if it got stained with Betadine or blood.  Do not wear any jewelry or perfume  Remove any nail coloring.  It will interfere with some of our monitoring equipment.  NOTE: Remember that this is not meant to be interpreted as a complete list of all possible complications.  Unforeseen problems may occur.  BLOOD THINNERS The following  drugs contain aspirin or other products, which can cause increased bleeding during surgery and should not be taken for 2 weeks prior to and 1 week after surgery.  If you should need take something for relief of minor pain, you may take acetaminophen which is found in Tylenol,m Datril, Anacin-3 and Panadol. It is not blood thinner. The products listed below are.  Do not take any of the products listed below in addition to any listed on your instruction sheet.  A.P.C or A.P.C with Codeine Codeine Phosphate Capsules #3 Ibuprofen Ridaura  ABC compound Congesprin Imuran rimadil  Advil Cope Indocin Robaxisal  Alka-Seltzer Effervescent Pain Reliever and Antacid Coricidin or Coricidin-D  Indomethacin Rufen  Alka-Seltzer plus Cold Medicine Cosprin Ketoprofen S-A-C Tablets  Anacin Analgesic Tablets or Capsules Coumadin Korlgesic Salflex  Anacin Extra Strength Analgesic tablets or capsules CP-2 Tablets Lanoril Salicylate  Anaprox Cuprimine Capsules Levenox Salocol  Anexsia-D Dalteparin Magan Salsalate  Anodynos Darvon compound Magnesium Salicylate Sine-off  Ansaid Dasin Capsules Magsal Sodium Salicylate  Anturane Depen Capsules Marnal Soma  APF Arthritis pain formula Dewitt's Pills Measurin Stanback  Argesic Dia-Gesic Meclofenamic Sulfinpyrazone  Arthritis Bayer Timed Release Aspirin Diclofenac Meclomen Sulindac  Arthritis pain formula Anacin Dicumarol Medipren Supac  Analgesic (Safety coated) Arthralgen Diffunasal Mefanamic Suprofen  Arthritis Strength Bufferin Dihydrocodeine Mepro Compound Suprol  Arthropan liquid Dopirydamole Methcarbomol with Aspirin Synalgos  ASA tablets/Enseals Disalcid Micrainin Tagament  Ascriptin Doan's Midol Talwin  Ascriptin A/D Dolene Mobidin Tanderil  Ascriptin Extra Strength Dolobid Moblgesic Ticlid  Ascriptin with Codeine Doloprin or Doloprin with Codeine Momentum Tolectin  Asperbuf Duoprin Mono-gesic Trendar  Aspergum Duradyne Motrin or Motrin IB Triminicin  Aspirin  plain, buffered or enteric coated Durasal Myochrisine Trigesic  Aspirin Suppositories Easprin Nalfon Trillsate  Aspirin with Codeine Ecotrin Regular or Extra Strength Naprosyn Uracel  Atromid-S Efficin Naproxen Ursinus  Auranofin Capsules Elmiron Neocylate Vanquish  Axotal Emagrin Norgesic Verin  Azathioprine Empirin or Empirin with Codeine Normiflo Vitamin E  Azolid Emprazil Nuprin Voltaren  Bayer Aspirin plain, buffered or children's or timed BC Tablets or powders Encaprin Orgaran Warfarin Sodium  Buff-a-Comp Enoxaparin Orudis Zorpin  Buff-a-Comp with Codeine Equegesic Os-Cal-Gesic   Buffaprin Excedrin plain, buffered or Extra Strength Oxalid  Bufferin Arthritis Strength Feldene Oxphenbutazone   Bufferin plain or Extra Strength Feldene Capsules Oxycodone with Aspirin   Bufferin with Codeine Fenoprofen Fenoprofen Pabalate or Pabalate-SF   Buffets II Flogesic Panagesic   Buffinol plain or Extra Strength Florinal or Florinal with Codeine Panwarfarin   Buf-Tabs Flurbiprofen Penicillamine   Butalbital Compound Four-way cold tablets Penicillin   Butazolidin Fragmin Pepto-Bismol   Carbenicillin Geminisyn Percodan   Carna Arthritis Reliever Geopen Persantine   Carprofen Gold's salt Persistin   Chloramphenicol Goody's Phenylbutazone   Chloromycetin Haltrain Piroxlcam   Clmetidine heparin Plaquenil   Cllnoril Hyco-pap Ponstel   Clofibrate Hydroxy chloroquine Propoxyphen         Before stopping any of these medications, be sure to consult the physician who ordered them.  Some, such as Coumadin (Warfarin) are ordered to prevent or treat serious conditions such as "deep thrombosis", "pumonary embolisms", and other heart problems.  The amount of time that you may need off of the medication may also vary with the medication and the reason for which you were taking it.  If you are taking any of these medications, please make sure you notify your pain physician before you undergo any  procedures.     Scripts for DOXYCYCLINE, TAPENTADOL, OXYCODONE, NEURONTIN, and ZANAFLEX was given to you today.

## 2014-06-24 NOTE — Progress Notes (Signed)
Discharged via w/c at 2:45 pm. Tolerating po fluids well. Instructions (verbal and written) given to patient. Teachback x3.

## 2014-06-24 NOTE — Telephone Encounter (Signed)
Patient notified by voicemail that Dr. Primus Bravo is writing a new prescription for a higher dose.

## 2014-06-24 NOTE — Telephone Encounter (Signed)
Annette [name]  Please let me know if we need to prescribe Nucynta ER 100 mg every 12 hours #60 Need to see if this will be approved by patient's insurance for patient patient to receive this ascription

## 2014-06-24 NOTE — Progress Notes (Signed)
   Subjective:    Patient ID: Annette [name], female    DOB: 11-19-1960, 54 y.o.   MRN: [id number]  HPI  Patient is 54 year old female returns to [place] for further evaluation and treatment of pain involving the lower extremities without to be due to diabetic neuropathy. See lumbar sympathetic block in attempt to decrease severity of patient's symptoms, hopefully retard progression of patient's symptoms, and avoid the need for more involved treatment this benefits and expectations of procedure discussed and explained to patient was understanding and in agreement with suggested treatment plan.  Patient to fluoroscopy suite EKG blood pressure pulse and pulse oximetry monitoring in place. Patient prone position with IV Versed and IV fentanyl conscious sedation.    The procedure:  Lumbar sympathetic block, left side  Needle placement at L2 on the left side for left side lumbar sympathetic block  Betadine prep of proposed entry site accomplished with local anesthetic skin wheal of 1-1/2% lidocaine to proposed entry site accomplished with the patient in the prone position. A 22-gauge spinal needle was inserted at the L2 vertebral body level on the left with 20 of oblique orientation. Following needle placement at the L2 vertebral body level on the left needle placement was accomplished at the L3 vertebral body level and L4 vertebral body levels on the left side. Following needle placement at the L2-L3 and L4 vertebral body levels on the left side needle placement was visualized on lateral view under fluoroscopic guidance the needle tips documented to be in the anterior third of the vertebral bodies of L2-L3 and L4 following negative aspiration for heme and CSF of all needles each needle was injected with 10 mils of quarter percent bupivacaine. Temperature readings prior to the procedure was 84 and following the completion of procedure was 90.4. Tolerated the procedure  well   Plan  Continue present medications and antibioticcs  F/U PCP for evaliation of  BP and general medical  condition.  F/U surgical evaluation.  F/U nrurological evaluation.  May consider radiofrequency rhizolysis or intraspinal procedures pending response to present treatment and F/U evaluation.  Patient to call Pain Management Center should patient have concerns prior to scheduled return appointment.      Review of Systems     Objective:   Physical Exam        Assessment & Plan:

## 2014-06-25 ENCOUNTER — Telehealth: Payer: Self-pay | Admitting: *Deleted

## 2014-06-25 NOTE — Telephone Encounter (Signed)
Patient denies any problems post procedure.  

## 2014-07-21 ENCOUNTER — Telehealth: Payer: Self-pay | Admitting: Pain Medicine

## 2014-07-21 NOTE — Telephone Encounter (Signed)
Patient needed clarification about taking Nucynta 100mg  prescribed last visit.  Instructed that she may take 100mg  every 12 hours. Understands NO NUCYNTA 50MG  OR OXYCODONE WHILE TAKING NUCYNTA 100MG  TABS.

## 2014-07-21 NOTE — Telephone Encounter (Signed)
Nurses  Thank you for providing follow-up information regarding patient's medications. Please have patient call after she has taken the medication for approximately 3 days from now to discuss patient's response to the medication

## 2014-07-21 NOTE — Telephone Encounter (Signed)
Nurses, Jocelyn Lamer and Caryl Pina  Please discussed medications with patient and let me know what patient needs in terms of her medications

## 2014-07-21 NOTE — Telephone Encounter (Signed)
Attempted to call patient. No answer. Left voicemail to return call.

## 2014-07-21 NOTE — Telephone Encounter (Signed)
Ms Lobue would like to talk to crisp personally.   Needs to talk to Dr Primus Bravo about her meds and an issue with them.

## 2014-07-28 ENCOUNTER — Ambulatory Visit: Payer: Medicare Other | Attending: Pain Medicine | Admitting: Pain Medicine

## 2014-07-28 ENCOUNTER — Encounter: Payer: Self-pay | Admitting: Pain Medicine

## 2014-07-28 VITALS — BP 145/86 | HR 61 | Temp 98.4°F | Resp 18 | Ht 65.0 in | Wt 289.0 lb

## 2014-07-28 DIAGNOSIS — M19019 Primary osteoarthritis, unspecified shoulder: Secondary | ICD-10-CM | POA: Diagnosis not present

## 2014-07-28 DIAGNOSIS — Z9889 Other specified postprocedural states: Secondary | ICD-10-CM | POA: Insufficient documentation

## 2014-07-28 DIAGNOSIS — Z96649 Presence of unspecified artificial hip joint: Secondary | ICD-10-CM | POA: Insufficient documentation

## 2014-07-28 DIAGNOSIS — M545 Low back pain: Secondary | ICD-10-CM | POA: Diagnosis present

## 2014-07-28 DIAGNOSIS — M47816 Spondylosis without myelopathy or radiculopathy, lumbar region: Secondary | ICD-10-CM | POA: Insufficient documentation

## 2014-07-28 DIAGNOSIS — Z411 Encounter for cosmetic surgery: Secondary | ICD-10-CM | POA: Insufficient documentation

## 2014-07-28 DIAGNOSIS — M5481 Occipital neuralgia: Secondary | ICD-10-CM | POA: Insufficient documentation

## 2014-07-28 DIAGNOSIS — G90521 Complex regional pain syndrome I of right lower limb: Secondary | ICD-10-CM | POA: Diagnosis not present

## 2014-07-28 DIAGNOSIS — G589 Mononeuropathy, unspecified: Secondary | ICD-10-CM | POA: Diagnosis not present

## 2014-07-28 DIAGNOSIS — E1340 Other specified diabetes mellitus with diabetic neuropathy, unspecified: Secondary | ICD-10-CM

## 2014-07-28 DIAGNOSIS — M533 Sacrococcygeal disorders, not elsewhere classified: Secondary | ICD-10-CM | POA: Diagnosis not present

## 2014-07-28 DIAGNOSIS — G5700 Lesion of sciatic nerve, unspecified lower limb: Secondary | ICD-10-CM

## 2014-07-28 DIAGNOSIS — G5701 Lesion of sciatic nerve, right lower limb: Secondary | ICD-10-CM | POA: Insufficient documentation

## 2014-07-28 MED ORDER — OXYCODONE HCL 10 MG PO TABS: ORAL_TABLET | ORAL | Status: DC

## 2014-07-28 MED ORDER — TAPENTADOL HCL ER 50 MG PO TB12
ORAL_TABLET | ORAL | Status: DC
Start: 2014-07-28 — End: 2014-08-27

## 2014-07-28 NOTE — Progress Notes (Signed)
Subjective:    Patient ID: Annette [name], female    DOB: [birth year], 54 y.o.   MRN: [id number]  HPI  Patient is 54 year old female returns to [place] for further evaluation and treatment of pain involving the lower back lower extremity region. 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 On today's visit patient states that she is unable to tolerate any Center ER 100 mg due to nausea headaches and other intolerable side effects. We discussed patient's condition and will prescribe Nucynta the car 50 mg every 8-12 hours with oxycodone for breakthrough pain. The patient stated that this regimen had been quite effective in terms of reducing her pain. We will avoid additional modification of treatment at this time. The patient was understanding and agreement status treatment plan. Patient will undergo follow-up evaluation with Dr.Damitz intervention of the right [facility] plastic surgery for excision of excessive tissue in the axillary region status post bilateral breast reduction. We will consider patient for modification of treatment pending follow-up evaluation.     Review of Systems     Objective:   Physical Exam there was tenderness to palpation of the splenius capitis and occipitalis musculature region palpation which reproduced mild discomfort. There was mild tenderness of the cervical facet cervical paraspinal must region palpation of the acromial clavicular glenohumeral joint region was with mild discomfort as well and patient appeared to be with slightly decreased grip strength with Tinel and Phalen's maneuver reproducing minimal discomfort. There was tenderness to palpation of the cervical facet cervical paraspinal muscular region and  thoracic facet thoracic paraspinal musculature region without crepitus of the thoracic region haven't been noted. Palpation over the lumbar paraspinal must region lumbar facet region associated with increased pain of moderate degree with extension and palpation lumbar facets reproducing moderate discomfort. 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 There was tenderness to palpation of the PSIS PII S region gluteal and piriformis musculature regions. There was negative clonus negative Homans. Moderate tenderness of the greater trochanteric region iliotibial band region. Abdomen nontender and no costovertebral angle tenderness noted.      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 We will discontinue Nucynta ER 100 mg due to intolerable side effects and will resume Nucynta ER 50 mg every 8-12 hours if tolerated with oxycodone for breakthrough pain. Continue Neurontin Zanaflex  F/U PCP for evaliation of  BP and general medical  Condition.  F/U Dr.Damitz that is post breast reduction as planned  F/U Dr Kasandra Knudsen as planned  F/U surgical evaluation.  F/U neurological evaluation.  May consider radiofrequency rhizolysis or intraspinal procedures pending response to present  treatment and F/U evaluation.  Patient  to call Pain Management Center should patient have concerns prior to scheduled return appointment.

## 2014-07-28 NOTE — Progress Notes (Signed)
Safety precautions to be maintained throughout the outpatient stay will include: orient to surroundings, keep bed in low position, maintain call bell within reach at all times, provide assistance with transfer out of bed and ambulation.  

## 2014-07-28 NOTE — Progress Notes (Signed)
Discharge patient home ambulatory at 1203 hrs Script given for nucynta er 50mg  and Oxycodone 10 mg Teach back done

## 2014-07-28 NOTE — Patient Instructions (Signed)
Continue present medications.. Please note that Nucynta ER is now 50 mg which you state you tolerate very well. Do not take Nucynta ER 100 mg. Continue other medications as prescribed  F/U PCP for evaliation of  BP and general medical  condition.  F/U surgical evaluation.  F/U neurological evaluation.  May consider radiofrequency rhizolysis or intraspinal procedures pending response to present treatment and F/U evaluation.  Patient to call Pain Management Center should patient have concerns prior to scheduled return appointment.

## 2014-08-14 ENCOUNTER — Other Ambulatory Visit: Payer: Self-pay | Admitting: Pain Medicine

## 2014-08-27 ENCOUNTER — Encounter: Payer: Self-pay | Admitting: Pain Medicine

## 2014-08-27 ENCOUNTER — Ambulatory Visit: Payer: Medicare Other | Attending: Pain Medicine | Admitting: Pain Medicine

## 2014-08-27 VITALS — BP 138/80 | HR 58 | Temp 98.0°F | Resp 18 | Ht 65.0 in | Wt 283.0 lb

## 2014-08-27 DIAGNOSIS — M19019 Primary osteoarthritis, unspecified shoulder: Secondary | ICD-10-CM | POA: Diagnosis not present

## 2014-08-27 DIAGNOSIS — G589 Mononeuropathy, unspecified: Secondary | ICD-10-CM | POA: Insufficient documentation

## 2014-08-27 DIAGNOSIS — M47816 Spondylosis without myelopathy or radiculopathy, lumbar region: Secondary | ICD-10-CM | POA: Insufficient documentation

## 2014-08-27 DIAGNOSIS — M533 Sacrococcygeal disorders, not elsewhere classified: Secondary | ICD-10-CM | POA: Insufficient documentation

## 2014-08-27 DIAGNOSIS — M542 Cervicalgia: Secondary | ICD-10-CM | POA: Diagnosis present

## 2014-08-27 DIAGNOSIS — Z96641 Presence of right artificial hip joint: Secondary | ICD-10-CM | POA: Diagnosis not present

## 2014-08-27 DIAGNOSIS — M5136 Other intervertebral disc degeneration, lumbar region: Secondary | ICD-10-CM

## 2014-08-27 DIAGNOSIS — E1340 Other specified diabetes mellitus with diabetic neuropathy, unspecified: Secondary | ICD-10-CM

## 2014-08-27 DIAGNOSIS — G5701 Lesion of sciatic nerve, right lower limb: Secondary | ICD-10-CM

## 2014-08-27 DIAGNOSIS — M546 Pain in thoracic spine: Secondary | ICD-10-CM | POA: Diagnosis present

## 2014-08-27 DIAGNOSIS — M5481 Occipital neuralgia: Secondary | ICD-10-CM | POA: Insufficient documentation

## 2014-08-27 DIAGNOSIS — G90521 Complex regional pain syndrome I of right lower limb: Secondary | ICD-10-CM | POA: Insufficient documentation

## 2014-08-27 DIAGNOSIS — M5030 Other cervical disc degeneration, unspecified cervical region: Secondary | ICD-10-CM

## 2014-08-27 DIAGNOSIS — R51 Headache: Secondary | ICD-10-CM | POA: Diagnosis present

## 2014-08-27 MED ORDER — OXYCODONE HCL 10 MG PO TABS: ORAL_TABLET | ORAL | Status: DC

## 2014-08-27 MED ORDER — TAPENTADOL HCL ER 50 MG PO TB12: ORAL_TABLET | ORAL | Status: DC

## 2014-08-27 NOTE — Progress Notes (Signed)
Discharged to home, ambulatory with script in hand for Oxycodone and Nucynta.  Pre procedure instructions given with teach back 3 done.

## 2014-08-27 NOTE — Progress Notes (Signed)
Subjective:    Patient ID: Annette [name], female    DOB: 18-Sep-1960, 54 y.o.   MRN: [id number]  HPI  Patient is 54 year old female returns to [facility] for further evaluation and treatment of pain involving the neck headaches upper back upper extremity regions mid back lower back and lower extremity region. Patient states her pain involves the region of the upper and mid back region as well as neck and headaches at this time. Patient states that headaches and pain occurring in the region of the neck and upper back region has been no significant muscle spasms. Patient states that the pain interferes with all activities of daily living as well as ability to obtain restful sleep. We discussed patient's condition and will consider patient for interventional treatment consisting of greater occipital nerve blocks at time return appointment in attempt to decrease severity of symptoms, minimize progression of symptoms, and hopefully avoid the need for more involved treatment. The patient was understanding and in agreement with suggested treatment plan. We will continue presently prescribed medications at this time. Patient is tolerating Nucynta well as well as of the medication      Review of Systems     Objective:   Physical Exam  There was moderate to moderately severe tenderness to palpation of the splenius capitis and occipitalis musculature region on the left as well as on the right. No new lesions of the head and neck were noted. There was tenderness of the cervical facet cervical paraspinal muscles region of moderate moderately severe degree. There appeared to be unremarkable Spurling's maneuver. Palpation of the acromioclavicular glenohumeral joint region reproduced mild to moderate discomfort. There was moderate to moderately severe tenderness to palpation of the sternocleidomastoid musculature region and the trapezius musculature region. Palpation over the region of the thoracic  facet thoracic paraspinal muscles region was with moderate discomfort. There was no crepitus of the thoracic region noted. Outpatient over the upper thoracic region was a significant muscle spasms. Patient appeared to be with bilaterally equal grip strength. Tinel and Phalen's maneuver were without increase of pain of significant degree. Palpation over the lumbar paraspinal muscles lumbar facet region was tenderness to palpation of mild to moderate degree. Lateral bending rotation extension and palpation lumbar facets reproduce mild to moderate discomfort. There was moderate tends to palpation of the PSIS and PII S regions as well as the gluteal and piriformis musculature musketry region with mild tenderness of the greater trochanteric region iliotibial band region. Straight leg raising tolerates approximately 20 without a definite increased pain with dorsiflexion noted decreased EHL strength was noted on the right compared to the left. There was negative Homans. Abdomen protuberant without excessive tends to palpation no costovertebral angle tenderness noted.      Assessment & Plan:   Progress Notes   JOHNETTE TEIGEN (MR# [id number])      Progress Notes Info    Author Note Status Last Update User Last Update Date/Time   Mohammed Kindle, MD Signed Mohammed Kindle, MD 07/28/2014 3:53 PM    Progress Notes    Expand All Collapse All     Subjective:    Patient ID: Annette [name], female DOB: December 10, 1960, 54 y.o. MRN: [id number]  HPI  Patient is 54 year old female returns to [facility] for further evaluation and treatment of pain involving the lower back lower extremity region. 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 On today's visit patient  states that she is unable to tolerate any Center ER 100 mg due to nausea headaches and other intolerable side effects. We discussed patient's condition and will prescribe Nucynta the car 50 mg every 8-12 hours with oxycodone for breakthrough pain. The patient stated that this regimen had been quite effective in terms of reducing her pain. We will avoid additional modification of treatment at this time. The patient was understanding and agreement status treatment plan. Patient will undergo follow-up evaluation with Dr.Damitz intervention of the right [facility] plastic surgery for excision of excessive tissue in the axillary region status post bilateral breast reduction. We will consider patient for modification of treatment pending follow-up evaluation.     Review of Systems     Objective:   Physical Exam there was tenderness to palpation of the splenius capitis and occipitalis musculature region palpation which reproduced mild discomfort. There was mild tenderness of the cervical facet cervical paraspinal must region palpation of the acromial clavicular glenohumeral joint region was with mild discomfort as well and patient appeared to be with slightly decreased grip strength with Tinel and Phalen's maneuver reproducing minimal discomfort. There was tenderness to palpation of the cervical facet cervical paraspinal muscular region and thoracic facet thoracic paraspinal musculature region without crepitus of the thoracic region haven't been noted. Palpation over the lumbar paraspinal must region lumbar facet region associated with increased pain of moderate degree with extension and palpation lumbar facets reproducing moderate discomfort. Straight leg raising tolerates approximately 20 without increased pain with dorsiflexion noted with EHL strength decreased on the right compared to the left (patient is status post total hip replacement on the right with hematoma formation resulting in sciatic nerve  palsy of the right lower extremity. There was tenderness to palpation of the PSIS PII S region gluteal and piriformis musculature regions. There was negative clonus negative Homans. Moderate tenderness of the greater trochanteric region iliotibial band region. Abdomen nontender and no costovertebral angle tenderness noted.      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 Quay Burow for evaliation of BP and general medical Condition.  F/U Dr.Damitz that is post breast reduction as planned  F/U Dr Kasandra Knudsen as planned  F/U surgical evaluation.  F/U neurological evaluation.  May consider radiofrequency rhizolysis or intraspinal procedures pending response to present treatment and F/U evaluation.  Patient to call [facility] should patient have concerns prior to scheduled return appointment.

## 2014-08-27 NOTE — Patient Instructions (Addendum)
Continue present medications Zanaflex and Neurontin and oxycodone and Nucynta   Greater occipital nerve block to be performed Wednesday, 09/09/2014  F/U PCP Dr. Quay Burow for evaliation of  BP and general medical  condition.  F/U surgical evaluation  F/U neurological evaluation  May consider radiofrequency rhizolysis or intraspinal procedures pending response to present treatment and F/U evaluation.  Patient to call [facility] should patient have concerns prior to scheduled return appointment. GENERAL RISKS AND COMPLICATIONS  What are the risk, side effects and possible complications? Generally speaking, most procedures are safe.  However, with any procedure there are risks, side effects, and the possibility of complications.  The risks and complications are dependent upon the sites that are lesioned, or the type of nerve block to be performed.  The closer the procedure is to the spine, the more serious the risks are.  Great care is taken when placing the radio frequency needles, block needles or lesioning probes, but sometimes complications can occur. 1. Infection: Any time there is an injection through the skin, there is a risk of infection.  This is why sterile conditions are used for these blocks.  There are four possible types of infection. 1. Localized skin infection. 2. Central Nervous System Infection-This can be in the form of Meningitis, which can be deadly. 3. Epidural Infections-This can be in the form of an epidural abscess, which can cause pressure inside of the spine, causing compression of the spinal cord with subsequent paralysis. This would require an emergency surgery to decompress, and there are no guarantees that the patient would recover from the paralysis. 4. Discitis-This is an infection of the intervertebral discs.  It occurs in about 1% of discography procedures.  It is difficult to treat and it may lead to surgery.        2. Pain: the needles have to go  through skin and soft tissues, will cause soreness.       3. Damage to internal structures:  The nerves to be lesioned may be near blood vessels or    other nerves which can be potentially damaged.       4. Bleeding: Bleeding is more common if the patient is taking blood thinners such as  aspirin, Coumadin, Ticiid, Plavix, etc., or if he/she have some genetic predisposition  such as hemophilia. Bleeding into the spinal canal can cause compression of the spinal  cord with subsequent paralysis.  This would require an emergency surgery to  decompress and there are no guarantees that the patient would recover from the  paralysis.       5. Pneumothorax:  Puncturing of a lung is a possibility, every time a needle is introduced in  the area of the chest or upper back.  Pneumothorax refers to free air around the  collapsed lung(s), inside of the thoracic cavity (chest cavity).  Another two possible  complications related to a similar event would include: Hemothorax and Chylothorax.   These are variations of the Pneumothorax, where instead of air around the collapsed  lung(s), you may have blood or chyle, respectively.       6. Spinal headaches: They may occur with any procedures in the area of the spine.       7. Persistent CSF (Cerebro-Spinal Fluid) leakage: This is a rare problem, but may occur  with prolonged intrathecal or epidural catheters either due to the formation of a fistulous  track or a dural tear.       8. Nerve damage:  By working so close to the spinal cord, there is always a possibility of  nerve damage, which could be as serious as a permanent spinal cord injury with  paralysis.       9. Death:  Although rare, severe deadly allergic reactions known as "Anaphylactic  reaction" can occur to any of the medications used.      10. Worsening of the symptoms:  We can always make thing worse.  What are the chances of something like this happening? Chances of any of this occuring are extremely low.  By  statistics, you have more of a chance of getting killed in a motor vehicle accident: while driving to the hospital than any of the above occurring .  Nevertheless, you should be aware that they are possibilities.  In general, it is similar to taking a shower.  Everybody knows that you can slip, hit your head and get killed.  Does that mean that you should not shower again?  Nevertheless always keep in mind that statistics do not mean anything if you happen to be on the wrong side of them.  Even if a procedure has a 1 (one) in a 1,000,000 (million) chance of going wrong, it you happen to be that one..Also, keep in mind that by statistics, you have more of a chance of having something go wrong when taking medications.  Who should not have this procedure? If you are on a blood thinning medication (e.g. Coumadin, Plavix, see list of "Blood Thinners"), or if you have an active infection going on, you should not have the procedure.  If you are taking any blood thinners, please inform your physician.  How should I prepare for this procedure?  Do not eat or drink anything at least six hours prior to the procedure.  Bring a driver with you .  It cannot be a taxi.  Come accompanied by an adult that can drive you back, and that is strong enough to help you if your legs get weak or numb from the local anesthetic.  Take all of your medicines the morning of the procedure with just enough water to swallow them.  If you have diabetes, make sure that you are scheduled to have your procedure done first thing in the morning, whenever possible.  If you have diabetes, take only half of your insulin dose and notify our nurse that you have done so as soon as you arrive at the clinic.  If you are diabetic, but only take blood sugar pills (oral hypoglycemic), then do not take them on the morning of your procedure.  You may take them after you have had the procedure.  Do not take aspirin or any aspirin-containing  medications, at least eleven (11) days prior to the procedure.  They may prolong bleeding.  Wear loose fitting clothing that may be easy to take off and that you would not mind if it got stained with Betadine or blood.  Do not wear any jewelry or perfume  Remove any nail coloring.  It will interfere with some of our monitoring equipment.  NOTE: Remember that this is not meant to be interpreted as a complete list of all possible complications.  Unforeseen problems may occur.  BLOOD THINNERS The following drugs contain aspirin or other products, which can cause increased bleeding during surgery and should not be taken for 2 weeks prior to and 1 week after surgery.  If you should need take something for relief of minor pain, you may  take acetaminophen which is found in Tylenol,m Datril, Anacin-3 and Panadol. It is not blood thinner. The products listed below are.  Do not take any of the products listed below in addition to any listed on your instruction sheet.  A.P.C or A.P.C with Codeine Codeine Phosphate Capsules #3 Ibuprofen Ridaura  ABC compound Congesprin Imuran rimadil  Advil Cope Indocin Robaxisal  Alka-Seltzer Effervescent Pain Reliever and Antacid Coricidin or Coricidin-D  Indomethacin Rufen  Alka-Seltzer plus Cold Medicine Cosprin Ketoprofen S-A-C Tablets  Anacin Analgesic Tablets or Capsules Coumadin Korlgesic Salflex  Anacin Extra Strength Analgesic tablets or capsules CP-2 Tablets Lanoril Salicylate  Anaprox Cuprimine Capsules Levenox Salocol  Anexsia-D Dalteparin Magan Salsalate  Anodynos Darvon compound Magnesium Salicylate Sine-off  Ansaid Dasin Capsules Magsal Sodium Salicylate  Anturane Depen Capsules Marnal Soma  APF Arthritis pain formula Dewitt's Pills Measurin Stanback  Argesic Dia-Gesic Meclofenamic Sulfinpyrazone  Arthritis Bayer Timed Release Aspirin Diclofenac Meclomen Sulindac  Arthritis pain formula Anacin Dicumarol Medipren Supac  Analgesic (Safety coated)  Arthralgen Diffunasal Mefanamic Suprofen  Arthritis Strength Bufferin Dihydrocodeine Mepro Compound Suprol  Arthropan liquid Dopirydamole Methcarbomol with Aspirin Synalgos  ASA tablets/Enseals Disalcid Micrainin Tagament  Ascriptin Doan's Midol Talwin  Ascriptin A/D Dolene Mobidin Tanderil  Ascriptin Extra Strength Dolobid Moblgesic Ticlid  Ascriptin with Codeine Doloprin or Doloprin with Codeine Momentum Tolectin  Asperbuf Duoprin Mono-gesic Trendar  Aspergum Duradyne Motrin or Motrin IB Triminicin  Aspirin plain, buffered or enteric coated Durasal Myochrisine Trigesic  Aspirin Suppositories Easprin Nalfon Trillsate  Aspirin with Codeine Ecotrin Regular or Extra Strength Naprosyn Uracel  Atromid-S Efficin Naproxen Ursinus  Auranofin Capsules Elmiron Neocylate Vanquish  Axotal Emagrin Norgesic Verin  Azathioprine Empirin or Empirin with Codeine Normiflo Vitamin E  Azolid Emprazil Nuprin Voltaren  Bayer Aspirin plain, buffered or children's or timed BC Tablets or powders Encaprin Orgaran Warfarin Sodium  Buff-a-Comp Enoxaparin Orudis Zorpin  Buff-a-Comp with Codeine Equegesic Os-Cal-Gesic   Buffaprin Excedrin plain, buffered or Extra Strength Oxalid   Bufferin Arthritis Strength Feldene Oxphenbutazone   Bufferin plain or Extra Strength Feldene Capsules Oxycodone with Aspirin   Bufferin with Codeine Fenoprofen Fenoprofen Pabalate or Pabalate-SF   Buffets II Flogesic Panagesic   Buffinol plain or Extra Strength Florinal or Florinal with Codeine Panwarfarin   Buf-Tabs Flurbiprofen Penicillamine   Butalbital Compound Four-way cold tablets Penicillin   Butazolidin Fragmin Pepto-Bismol   Carbenicillin Geminisyn Percodan   Carna Arthritis Reliever Geopen Persantine   Carprofen Gold's salt Persistin   Chloramphenicol Goody's Phenylbutazone   Chloromycetin Haltrain Piroxlcam   Clmetidine heparin Plaquenil   Cllnoril Hyco-pap Ponstel   Clofibrate Hydroxy chloroquine Propoxyphen          Before stopping any of these medications, be sure to consult the physician who ordered them.  Some, such as Coumadin (Warfarin) are ordered to prevent or treat serious conditions such as "deep thrombosis", "pumonary embolisms", and other heart problems.  The amount of time that you may need off of the medication may also vary with the medication and the reason for which you were taking it.  If you are taking any of these medications, please make sure you notify your pain physician before you undergo any procedures.         Occipital Nerve Block Patient Information  Description: The occipital nerves originate in the cervical (neck) spinal cord and travel upward through muscle and tissue to supply sensation to the back of the head and top of the scalp.  In addition, the nerves control some of the  muscles of the scalp.  Occipital neuralgia is an irritation of these nerves which can cause headaches, numbness of the scalp, and neck discomfort.     The occipital nerve block will interrupt nerve transmission through these nerves and can relieve pain and spasm.  The block consists of insertion of a small needle under the skin in the back of the head to deposit local anesthetic (numbing medicine) and/or steroids around the nerve.  The entire block usually lasts less than 5 minutes.  Conditions which may be treated by occipital blocks:   Muscular pain and spasm of the scalp  Nerve irritation, back of the head  Headaches  Upper neck pain  Preparation for the injection:  12. Do not eat any solid food or dairy products within 6 hours of your appointment. 13. You may drink clear liquids up to 2 hours before appointment.  Clear liquids include water, black coffee, juice or soda.  No milk or cream please. 14. You may take your regular medication, including pain medications, with a sip of water before you appointment.  Diabetics should hold regular insulin (if taken separately) and take 1/2 normal  NPH dose the morning of the procedure.  Carry some sugar containing items with you to your appointment. 15. A driver must accompany you and be prepared to drive you home after your procedure. 54. Bring all your current medications with you. 17. An IV may be inserted and sedation may be given at the discretion of the physician. 18. A blood pressure cuff, EKG, and other monitors will often be applied during the procedure.  Some patients may need to have extra oxygen administered for a short period. 9. You will be asked to provide medical information, including your allergies and medications, prior to the procedure.  We must know immediately if you are taking blood thinners (like Coumadin/Warfarin) or if you are allergic to IV iodine contrast (dye).  We must know if you could possible be pregnant.  20. Do not wear a high collared shirt or turtleneck.  Tie long hair up in the back if possible.  Possible side-effects:   Bleeding from needle site  Infection (rare, may require surgery)  Nerve injury (rare)  Hair on back of neck can be tinged with iodine scrub (this will wash out)  Light-headedness (temporary)  Pain at injection site (several days)  Decreased blood pressure (rare, temporary)  Seizure (very rare)  Call if you experience:   Hives or difficulty breathing ( go to the emergency room)  Inflammation or drainage at the injection site(s)  Please note:  Although the local anesthetic injected can often make your painful muscles or headache feel good for several hours after the injection, the pain may return.  It takes 3-7 days for steroids to work.  You may not notice any pain relief for at least one week.  If effective, we will often do a series of injections spaced 3-6 weeks apart to maximally decrease your pain.  If you have any questions, please call [phone] [facility]

## 2014-08-27 NOTE — Progress Notes (Signed)
Safety precautions to be maintained throughout the outpatient stay will include: orient to surroundings, keep bed in low position, maintain call bell within reach at all times, provide assistance with transfer out of bed and ambulation.  

## 2014-09-09 ENCOUNTER — Ambulatory Visit: Payer: Medicare Other | Admitting: Pain Medicine

## 2014-09-23 ENCOUNTER — Ambulatory Visit: Payer: Medicare Other | Attending: Pain Medicine | Admitting: Pain Medicine

## 2014-09-23 ENCOUNTER — Encounter: Payer: Self-pay | Admitting: Pain Medicine

## 2014-09-23 VITALS — BP 110/71 | HR 56 | Temp 98.4°F | Resp 16 | Ht 65.0 in | Wt 283.0 lb

## 2014-09-23 DIAGNOSIS — M47816 Spondylosis without myelopathy or radiculopathy, lumbar region: Secondary | ICD-10-CM

## 2014-09-23 DIAGNOSIS — E1340 Other specified diabetes mellitus with diabetic neuropathy, unspecified: Secondary | ICD-10-CM

## 2014-09-23 DIAGNOSIS — R51 Headache: Secondary | ICD-10-CM | POA: Diagnosis not present

## 2014-09-23 DIAGNOSIS — M545 Low back pain: Secondary | ICD-10-CM | POA: Diagnosis not present

## 2014-09-23 DIAGNOSIS — M5030 Other cervical disc degeneration, unspecified cervical region: Secondary | ICD-10-CM

## 2014-09-23 DIAGNOSIS — G5701 Lesion of sciatic nerve, right lower limb: Secondary | ICD-10-CM

## 2014-09-23 DIAGNOSIS — M546 Pain in thoracic spine: Secondary | ICD-10-CM | POA: Diagnosis not present

## 2014-09-23 DIAGNOSIS — M542 Cervicalgia: Secondary | ICD-10-CM | POA: Diagnosis present

## 2014-09-23 DIAGNOSIS — M5481 Occipital neuralgia: Secondary | ICD-10-CM

## 2014-09-23 DIAGNOSIS — M5136 Other intervertebral disc degeneration, lumbar region: Secondary | ICD-10-CM

## 2014-09-23 MED ORDER — BUPIVACAINE HCL (PF) 0.25 % IJ SOLN
INTRAMUSCULAR | Status: AC
  Administered 2014-09-23: 11:00:00
  Filled 2014-09-23: qty 30

## 2014-09-23 MED ORDER — TRIAMCINOLONE ACETONIDE 40 MG/ML IJ SUSP
INTRAMUSCULAR | Status: AC
  Filled 2014-09-23: qty 1

## 2014-09-23 MED ORDER — ORPHENADRINE CITRATE 30 MG/ML IJ SOLN
INTRAMUSCULAR | Status: AC
  Administered 2014-09-23: 11:00:00
  Filled 2014-09-23: qty 2

## 2014-09-23 MED ORDER — FENTANYL CITRATE (PF) 100 MCG/2ML IJ SOLN
INTRAMUSCULAR | Status: AC
  Administered 2014-09-23: 100 ug via INTRAVENOUS
  Filled 2014-09-23: qty 2

## 2014-09-23 MED ORDER — MIDAZOLAM HCL 5 MG/5ML IJ SOLN
INTRAMUSCULAR | Status: AC
  Administered 2014-09-23: 5 mg via INTRAVENOUS
  Filled 2014-09-23: qty 5

## 2014-09-23 MED ORDER — KETOROLAC TROMETHAMINE 60 MG/2ML IM SOLN
INTRAMUSCULAR | Status: AC
  Administered 2014-09-23: 11:00:00
  Filled 2014-09-23: qty 2

## 2014-09-23 NOTE — Progress Notes (Signed)
   Subjective:    Patient ID: Annette [name], female    DOB: 1960/02/22, 54 y.o.   MRN: [id number]  HPI  NOTE: The patient is a 54 y.o.-year-old female who [facility] for further evaluation and treatment of pain consisting of pain involving the region of the neck and headache.  Patient is with prior history of headaches with pain radiating from the neck to the back of the head as well as lower back pain and mid back pain. There is concern regarding significant component of patient's headaches being due to greater occipital neuralgia and myofascial pain related headaches .  The risks, benefits, and expectations of the procedure have been discussed and explained to patient, who is understanding and wishes to proceed with interventional treatment as discussed and as explained to patient.  Will proceed with greater occipital nerve blocks with myoneural block injections at this time as discussed and as explained to patient.  All are understanding and in agreement with suggested treatment plan.    PROCEDURE:  Greater occipital nerve block on the left side with IV Versed, IV Fentanyl, conscious sedation, EKG, blood pressure, pulse, pulse oximetry monitoring.  Procedure performed with patient in prone position.  Greater occipital nerve block on the left side.   With patient in prone position, Betadine prep of proposed entry site accomplished.  Following identification of the nuchal ridge, 22 -gauge needle was inserted at the level of the nuchal ridge medial to the occipital artery.  Following negative aspiration, 4cc 0.25% bupivacaine with Toradol injected for left greater occipital nerve block.  Needle was removed.  Patient tolerated injection well.   Greater occipital nerve block on the rightt side. The greater occipital nerve block on the right side was performed exactly as the left greater occipital nerve block was performed and utilizing the same technique  Myoneural block  injections of the thoracic region and the lumbar region Following Betadine prep of proposed entry site a 22-gauge needle was inserted in the thoracic musculature region and following negative aspiration for cc of 0.25% bupivacaine with Norflex was injected for myoneural block injection of the thoracic region 2 Following Betadine prep of the lumbar region a 22-gauge needle was inserted in the lumbar musculature region and following negative aspiration for cc of 0.25% bupivacaine with Norflex was injected for myoneural block injection of the lumbar region 1   The patient tolerated the procedure well   A total of 10 mg Kenalog was utilized for the entire procedure.  PLAN:    1. Medications: Will continue presently prescribed medications Nucynta ER oxycodone Neurontin and Zanaflex at this time. 2. Patient to follow up with primary care physician Dr. Quay Burow for evaluation of blood pressure and general medical condition status post procedure performed on today's visit. 3. Neurological evaluation for further assessment of headaches for further studies as discussed. 4. Surgical evaluation as discussed.  5. Patient may be candidate for Botox injections, radiofrequency procedures, as well as implantation type procedures pending response to treatment rendered on today's visit and pending follow-up evaluation. 6. Patient has been advised to adhere to proper body mechanics and to avoid activities which appear to aggravate condition.cations:  Will continue presently prescribed medications at this time. 7. The patient is understanding and in agreement with the suggested treatment plan.     Review of Systems     Objective:   Physical Exam        Assessment & Plan:

## 2014-09-23 NOTE — Patient Instructions (Addendum)
Continue present medications Nucynta Neurontin oxycodone Zanaflex   F/U PCP Dr. Quay Burow for evaliation of  BP and general medical  condition  F/U surgical evaluation  F/U neurological evaluation  May consider radiofrequency rhizolysis or intraspinal procedures pending response to present treatment and F/U evaluation   Patient to call [facility] Center should patient have concerns prior to scheduled return appointmen. Pain Management Discharge Instructions  General Discharge Instructions :  If you need to reach your doctor call: Monday-Friday 8:00 am - 4:00 pm at [phone] or toll free [phone].  After clinic hours [phone] to have operator reach doctor.  Bring all of your medication bottles to all your appointments in the pain clinic.  To cancel or reschedule your appointment with Pain Management please remember to call 24 hours in advance to avoid a fee.  Refer to the educational materials which you have been given on: General Risks, I had my Procedure. Discharge Instructions, Post Sedation.  Post Procedure Instructions:  The drugs you were given will stay in your system until tomorrow, so for the next 24 hours you should not drive, make any legal decisions or drink any alcoholic beverages.  You may eat anything you prefer, but it is better to start with liquids then soups and crackers, and gradually work up to solid foods.  Please notify your doctor immediately if you have any unusual bleeding, trouble breathing or pain that is not related to your normal pain.  Depending on the type of procedure that was done, some parts of your body may feel week and/or numb.  This usually clears up by tonight or the next day.  Walk with the use of an assistive device or accompanied by an adult for the 24 hours.  You may use ice on the affected area for the first 24 hours.  Put ice in a Ziploc bag and cover with a towel and place against area 15 minutes on 15 minutes off.  You may  switch to heat after 24 hours.GENERAL RISKS AND COMPLICATIONS  What are the risk, side effects and possible complications? Generally speaking, most procedures are safe.  However, with any procedure there are risks, side effects, and the possibility of complications.  The risks and complications are dependent upon the sites that are lesioned, or the type of nerve block to be performed.  The closer the procedure is to the spine, the more serious the risks are.  Great care is taken when placing the radio frequency needles, block needles or lesioning probes, but sometimes complications can occur. 1. Infection: Any time there is an injection through the skin, there is a risk of infection.  This is why sterile conditions are used for these blocks.  There are four possible types of infection. 1. Localized skin infection. 2. Central Nervous System Infection-This can be in the form of Meningitis, which can be deadly. 3. Epidural Infections-This can be in the form of an epidural abscess, which can cause pressure inside of the spine, causing compression of the spinal cord with subsequent paralysis. This would require an emergency surgery to decompress, and there are no guarantees that the patient would recover from the paralysis. 4. Discitis-This is an infection of the intervertebral discs.  It occurs in about 1% of discography procedures.  It is difficult to treat and it may lead to surgery.        2. Pain: the needles have to go through skin and soft tissues, will cause soreness.  3. Damage to internal structures:  The nerves to be lesioned may be near blood vessels or    other nerves which can be potentially damaged.       4. Bleeding: Bleeding is more common if the patient is taking blood thinners such as  aspirin, Coumadin, Ticiid, Plavix, etc., or if he/she have some genetic predisposition  such as hemophilia. Bleeding into the spinal canal can cause compression of the spinal  cord with subsequent  paralysis.  This would require an emergency surgery to  decompress and there are no guarantees that the patient would recover from the  paralysis.       5. Pneumothorax:  Puncturing of a lung is a possibility, every time a needle is introduced in  the area of the chest or upper back.  Pneumothorax refers to free air around the  collapsed lung(s), inside of the thoracic cavity (chest cavity).  Another two possible  complications related to a similar event would include: Hemothorax and Chylothorax.   These are variations of the Pneumothorax, where instead of air around the collapsed  lung(s), you may have blood or chyle, respectively.       6. Spinal headaches: They may occur with any procedures in the area of the spine.       7. Persistent CSF (Cerebro-Spinal Fluid) leakage: This is a rare problem, but may occur  with prolonged intrathecal or epidural catheters either due to the formation of a fistulous  track or a dural tear.       8. Nerve damage: By working so close to the spinal cord, there is always a possibility of  nerve damage, which could be as serious as a permanent spinal cord injury with  paralysis.       9. Death:  Although rare, severe deadly allergic reactions known as "Anaphylactic  reaction" can occur to any of the medications used.      10. Worsening of the symptoms:  We can always make thing worse.  What are the chances of something like this happening? Chances of any of this occuring are extremely low.  By statistics, you have more of a chance of getting killed in a motor vehicle accident: while driving to the hospital than any of the above occurring .  Nevertheless, you should be aware that they are possibilities.  In general, it is similar to taking a shower.  Everybody knows that you can slip, hit your head and get killed.  Does that mean that you should not shower again?  Nevertheless always keep in mind that statistics do not mean anything if you happen to be on the wrong side of  them.  Even if a procedure has a 1 (one) in a 1,000,000 (million) chance of going wrong, it you happen to be that one..Also, keep in mind that by statistics, you have more of a chance of having something go wrong when taking medications.  Who should not have this procedure? If you are on a blood thinning medication (e.g. Coumadin, Plavix, see list of "Blood Thinners"), or if you have an active infection going on, you should not have the procedure.  If you are taking any blood thinners, please inform your physician.  How should I prepare for this procedure?  Do not eat or drink anything at least six hours prior to the procedure.  Bring a driver with you .  It cannot be a taxi.  Come accompanied by an adult that can drive you back, and  that is strong enough to help you if your legs get weak or numb from the local anesthetic.  Take all of your medicines the morning of the procedure with just enough water to swallow them.  If you have diabetes, make sure that you are scheduled to have your procedure done first thing in the morning, whenever possible.  If you have diabetes, take only half of your insulin dose and notify our nurse that you have done so as soon as you arrive at the clinic.  If you are diabetic, but only take blood sugar pills (oral hypoglycemic), then do not take them on the morning of your procedure.  You may take them after you have had the procedure.  Do not take aspirin or any aspirin-containing medications, at least eleven (11) days prior to the procedure.  They may prolong bleeding.  Wear loose fitting clothing that may be easy to take off and that you would not mind if it got stained with Betadine or blood.  Do not wear any jewelry or perfume  Remove any nail coloring.  It will interfere with some of our monitoring equipment.  NOTE: Remember that this is not meant to be interpreted as a complete list of all possible complications.  Unforeseen problems may occur.  BLOOD  THINNERS The following drugs contain aspirin or other products, which can cause increased bleeding during surgery and should not be taken for 2 weeks prior to and 1 week after surgery.  If you should need take something for relief of minor pain, you may take acetaminophen which is found in Tylenol,m Datril, Anacin-3 and Panadol. It is not blood thinner. The products listed below are.  Do not take any of the products listed below in addition to any listed on your instruction sheet.  A.P.C or A.P.C with Codeine Codeine Phosphate Capsules #3 Ibuprofen Ridaura  ABC compound Congesprin Imuran rimadil  Advil Cope Indocin Robaxisal  Alka-Seltzer Effervescent Pain Reliever and Antacid Coricidin or Coricidin-D  Indomethacin Rufen  Alka-Seltzer plus Cold Medicine Cosprin Ketoprofen S-A-C Tablets  Anacin Analgesic Tablets or Capsules Coumadin Korlgesic Salflex  Anacin Extra Strength Analgesic tablets or capsules CP-2 Tablets Lanoril Salicylate  Anaprox Cuprimine Capsules Levenox Salocol  Anexsia-D Dalteparin Magan Salsalate  Anodynos Darvon compound Magnesium Salicylate Sine-off  Ansaid Dasin Capsules Magsal Sodium Salicylate  Anturane Depen Capsules Marnal Soma  APF Arthritis pain formula Dewitt's Pills Measurin Stanback  Argesic Dia-Gesic Meclofenamic Sulfinpyrazone  Arthritis Bayer Timed Release Aspirin Diclofenac Meclomen Sulindac  Arthritis pain formula Anacin Dicumarol Medipren Supac  Analgesic (Safety coated) Arthralgen Diffunasal Mefanamic Suprofen  Arthritis Strength Bufferin Dihydrocodeine Mepro Compound Suprol  Arthropan liquid Dopirydamole Methcarbomol with Aspirin Synalgos  ASA tablets/Enseals Disalcid Micrainin Tagament  Ascriptin Doan's Midol Talwin  Ascriptin A/D Dolene Mobidin Tanderil  Ascriptin Extra Strength Dolobid Moblgesic Ticlid  Ascriptin with Codeine Doloprin or Doloprin with Codeine Momentum Tolectin  Asperbuf Duoprin Mono-gesic Trendar  Aspergum Duradyne Motrin or Motrin  IB Triminicin  Aspirin plain, buffered or enteric coated Durasal Myochrisine Trigesic  Aspirin Suppositories Easprin Nalfon Trillsate  Aspirin with Codeine Ecotrin Regular or Extra Strength Naprosyn Uracel  Atromid-S Efficin Naproxen Ursinus  Auranofin Capsules Elmiron Neocylate Vanquish  Axotal Emagrin Norgesic Verin  Azathioprine Empirin or Empirin with Codeine Normiflo Vitamin E  Azolid Emprazil Nuprin Voltaren  Bayer Aspirin plain, buffered or children's or timed BC Tablets or powders Encaprin Orgaran Warfarin Sodium  Buff-a-Comp Enoxaparin Orudis Zorpin  Buff-a-Comp with Codeine Equegesic Os-Cal-Gesic   Buffaprin Excedrin plain, buffered  or Extra Strength Oxalid   Bufferin Arthritis Strength Feldene Oxphenbutazone   Bufferin plain or Extra Strength Feldene Capsules Oxycodone with Aspirin   Bufferin with Codeine Fenoprofen Fenoprofen Pabalate or Pabalate-SF   Buffets II Flogesic Panagesic   Buffinol plain or Extra Strength Florinal or Florinal with Codeine Panwarfarin   Buf-Tabs Flurbiprofen Penicillamine   Butalbital Compound Four-way cold tablets Penicillin   Butazolidin Fragmin Pepto-Bismol   Carbenicillin Geminisyn Percodan   Carna Arthritis Reliever Geopen Persantine   Carprofen Gold's salt Persistin   Chloramphenicol Goody's Phenylbutazone   Chloromycetin Haltrain Piroxlcam   Clmetidine heparin Plaquenil   Cllnoril Hyco-pap Ponstel   Clofibrate Hydroxy chloroquine Propoxyphen         Before stopping any of these medications, be sure to consult the physician who ordered them.  Some, such as Coumadin (Warfarin) are ordered to prevent or treat serious conditions such as "deep thrombosis", "pumonary embolisms", and other heart problems.  The amount of time that you may need off of the medication may also vary with the medication and the reason for which you were taking it.  If you are taking any of these medications, please make sure you notify your pain physician before you  undergo any procedures.

## 2014-09-23 NOTE — Progress Notes (Signed)
Safety precautions to be maintained throughout the outpatient stay will include: orient to surroundings, keep bed in low position, maintain call bell within reach at all times, provide assistance with transfer out of bed and ambulation.  

## 2014-09-24 ENCOUNTER — Telehealth: Payer: Self-pay | Admitting: *Deleted

## 2014-09-24 NOTE — Telephone Encounter (Signed)
Ok post procedure call.

## 2014-09-28 ENCOUNTER — Ambulatory Visit: Payer: Medicare Other | Attending: Pain Medicine | Admitting: Pain Medicine

## 2014-09-28 ENCOUNTER — Encounter: Payer: Self-pay | Admitting: Pain Medicine

## 2014-09-28 VITALS — BP 141/82 | HR 64 | Temp 97.5°F | Resp 15 | Ht 65.0 in | Wt 283.0 lb

## 2014-09-28 DIAGNOSIS — M533 Sacrococcygeal disorders, not elsewhere classified: Secondary | ICD-10-CM | POA: Diagnosis not present

## 2014-09-28 DIAGNOSIS — R51 Headache: Secondary | ICD-10-CM | POA: Diagnosis present

## 2014-09-28 DIAGNOSIS — M542 Cervicalgia: Secondary | ICD-10-CM | POA: Diagnosis present

## 2014-09-28 DIAGNOSIS — M5136 Other intervertebral disc degeneration, lumbar region: Secondary | ICD-10-CM | POA: Diagnosis not present

## 2014-09-28 DIAGNOSIS — Z96649 Presence of unspecified artificial hip joint: Secondary | ICD-10-CM | POA: Diagnosis not present

## 2014-09-28 DIAGNOSIS — G90521 Complex regional pain syndrome I of right lower limb: Secondary | ICD-10-CM | POA: Insufficient documentation

## 2014-09-28 DIAGNOSIS — M19019 Primary osteoarthritis, unspecified shoulder: Secondary | ICD-10-CM | POA: Insufficient documentation

## 2014-09-28 DIAGNOSIS — M5481 Occipital neuralgia: Secondary | ICD-10-CM

## 2014-09-28 DIAGNOSIS — M5030 Other cervical disc degeneration, unspecified cervical region: Secondary | ICD-10-CM

## 2014-09-28 DIAGNOSIS — E1340 Other specified diabetes mellitus with diabetic neuropathy, unspecified: Secondary | ICD-10-CM

## 2014-09-28 DIAGNOSIS — M47816 Spondylosis without myelopathy or radiculopathy, lumbar region: Secondary | ICD-10-CM | POA: Insufficient documentation

## 2014-09-28 DIAGNOSIS — G5701 Lesion of sciatic nerve, right lower limb: Secondary | ICD-10-CM

## 2014-09-28 MED ORDER — OXYCODONE HCL 10 MG PO TABS: ORAL_TABLET | ORAL | Status: DC

## 2014-09-28 MED ORDER — TAPENTADOL HCL ER 100 MG PO TB12: ORAL_TABLET | ORAL | Status: DC

## 2014-09-28 NOTE — Progress Notes (Signed)
.  pmdsIRON DEXTRAN TEST DOSE RESULT Safety precautions to be maintained throughout the outpatient stay will include: orient to surroundings, keep bed in low position, maintain call bell within reach at all times, provide assistance with transfer out of bed and ambulation.

## 2014-09-28 NOTE — Patient Instructions (Addendum)
Continue present medications oxycodone NucyntaER Neurontin Zanaflex .   CAUTION  Nucynta ER  is now 100 mg and is now twice as strong  . Please exercise extreme caution when taking the stronger Nucyn[place] ER in[place]state in the presence of an adult driver for the first 3 days while taking the new medication and go to emergency room immediately if develope any undesirable side effects. Nucynta ER can cause respiratory depression confusion and other side effects.. DO NOT TAKE ANY OXYCODONE FOR THE FIRST WEEK while beginning Nucynta ER 100 mg.  F/U PCP Dr. Quay Burow for evaliation of  BP and general medical  condition  F/U surgical evaluation  F/U neurological evaluation  May consider radiofrequency rhizolysis or intraspinal procedures pending response to present treatment and F/U evaluation   Patient to call Pain Management Center should patient have concerns prior to scheduled return appointmen. Pain Management Discharge Instructions  General Discharge Instructions :  If you need to reach your doctor call: Monday-Friday 8:00 am - 4:00 pm at [phone] or toll free [phone].  After clinic hours [phone] to have operator reach doctor.  Bring all of your medication bottles to all your appointments in the pain clinic.  To cancel or reschedule your appointment with Pain Management please remember to call 24 hours in advance to avoid a fee.  Refer to the educational materials which you have been given on: General Risks, I had my Procedure. Discharge Instructions, Post Sedation.  Post Procedure Instructions:  The drugs you were given will stay in your system until tomorrow, so for the next 24 hours you should not drive, make any legal decisions or drink any alcoholic beverages.  You may eat anything you prefer, but it is better to start with liquids then soups and crackers, and gradually work up to solid foods.  Please notify your doctor immediately if you have any unusual bleeding, trouble  breathing or pain that is not related to your normal pain.  Depending on the type of procedure that was done, some parts of your body may feel week and/or numb.  This usually clears up by tonight or the next day.  Walk with the use of an assistive device or accompanied by an adult for the 24 hours.  You may use ice on the affected area for the first 24 hours.  Put ice in a Ziploc bag and cover with a towel and place against area 15 minutes on 15 minutes off.  You may switch to heat after 24 hours.

## 2014-09-28 NOTE — Progress Notes (Signed)
Subjective:    Patient ID: Annette [name], female    DOB: Aug 12, 1960, 54 y.o.   MRN: [id number]  HPI Patient is 54 year old female returns to [facility] for further evaluation and treatment of pain involving the neck headaches upper mid and lower back and lower extremity regions. Patient notes significant improvement of her headache following greater occipital nerve blocks. We discussed patient's condition and will adjust medications on today's visit. Tendon Nucynta with oxycodone for breakthrough pain. We discussed the increased strength of the pill from 50 mg to a 100 mg extended release pill. We've recommended patient undergo respiratory depression confusion and other side effects which can occur with these medications. The patient expressed understanding and will make all attempts to avoid undesirable side effects. We will also discussed the need for patient to remain in the company of the travel with the first week while taking the medication which is a stronger dose than the previous medication. The patient expressed understanding and will comply. Patient denied any of the changes in her activities of daily living and we will proceed with modification of medications as discussed remain available to consider additional modification pending response to treatment and follow-up evaluation. The patient was understanding and in agreement with suggested treatment plan.     Review of Systems     Objective:   Physical Exam  Palpation of the splenius capitis Cipro talus musculature regions reproduced mild discomfort. There were no new lesions of the head and neck noted. There appeared to be decreased range of motion of the cervical spine. There was tenderness to palpation of the cervical facet cervical paraspinal musculature region of mild to moderate degree. There was unremarkable Spurling's maneuver. Palpation of the acromioclavicular and glenohumeral joint regions reproduced pain of  minimal degree. Patient appeared to be with bilaterally equal grip strength. Tinel and Phalen's maneuver were without increase of pain. There was tenderness to palpation over the lumbar paraspinal muscles region lumbar facet region palpation which reproduces her discomfort. EHL strength was decreased on the right compared to the left. There was decreased straight leg raising tolerates approximately 20 without a definite increased pain with dorsiflexion noted. There was moderate tenderness over the PSIS and PII S region There was negative clonus negative Homans. There was mild tennis on the greater trochanteric region and iliotibial band region. Abdomen was soft nontender and no costovertebral tenderness was noted.      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 On today's visit we explained to patient that Nucynta will now be 100 mg extended release instead of 50 mg extended release. Patient will avoid taking oxycodone for the first week while she increases her Nucynta ER  dose as prescribed. The patient expressed understanding and will make all attempts to avoid undesirable side effects   F/U PCPDr. Quay Burow  for evaliation of  BP and general medical  condition  F/U surgical evaluation  F/U neurological evaluation  F/U Dr.Su as planned   May consider radiofrequency rhizolysis or intraspinal procedures pending response to present treatment and F/U evaluation   Patient to call  Pain Management Center should  patient have concerns prior to scheduled return appointmen.

## 2014-09-29 ENCOUNTER — Encounter: Payer: Medicare Other | Admitting: Pain Medicine

## 2014-10-07 ENCOUNTER — Encounter: Payer: Medicare Other | Admitting: Pain Medicine

## 2014-10-29 ENCOUNTER — Encounter: Payer: Self-pay | Admitting: Pain Medicine

## 2014-10-29 ENCOUNTER — Ambulatory Visit: Payer: Medicare Other | Attending: Pain Medicine | Admitting: Pain Medicine

## 2014-10-29 VITALS — BP 134/87 | HR 75 | Temp 98.0°F | Resp 18 | Ht 65.0 in | Wt 380.0 lb

## 2014-10-29 DIAGNOSIS — M5481 Occipital neuralgia: Secondary | ICD-10-CM

## 2014-10-29 DIAGNOSIS — M47816 Spondylosis without myelopathy or radiculopathy, lumbar region: Secondary | ICD-10-CM | POA: Insufficient documentation

## 2014-10-29 DIAGNOSIS — M5136 Other intervertebral disc degeneration, lumbar region: Secondary | ICD-10-CM | POA: Insufficient documentation

## 2014-10-29 DIAGNOSIS — M533 Sacrococcygeal disorders, not elsewhere classified: Secondary | ICD-10-CM | POA: Insufficient documentation

## 2014-10-29 DIAGNOSIS — G90521 Complex regional pain syndrome I of right lower limb: Secondary | ICD-10-CM | POA: Insufficient documentation

## 2014-10-29 DIAGNOSIS — G5700 Lesion of sciatic nerve, unspecified lower limb: Secondary | ICD-10-CM

## 2014-10-29 DIAGNOSIS — M19019 Primary osteoarthritis, unspecified shoulder: Secondary | ICD-10-CM | POA: Insufficient documentation

## 2014-10-29 DIAGNOSIS — Z96649 Presence of unspecified artificial hip joint: Secondary | ICD-10-CM | POA: Insufficient documentation

## 2014-10-29 DIAGNOSIS — M5030 Other cervical disc degeneration, unspecified cervical region: Secondary | ICD-10-CM

## 2014-10-29 DIAGNOSIS — M542 Cervicalgia: Secondary | ICD-10-CM | POA: Diagnosis present

## 2014-10-29 DIAGNOSIS — E1340 Other specified diabetes mellitus with diabetic neuropathy, unspecified: Secondary | ICD-10-CM

## 2014-10-29 DIAGNOSIS — G5701 Lesion of sciatic nerve, right lower limb: Secondary | ICD-10-CM

## 2014-10-29 DIAGNOSIS — R51 Headache: Secondary | ICD-10-CM | POA: Diagnosis present

## 2014-10-29 MED ORDER — OXYCODONE HCL 10 MG PO TABS: ORAL_TABLET | ORAL | Status: DC

## 2014-10-29 MED ORDER — GABAPENTIN 600 MG PO TABS: ORAL_TABLET | ORAL | Status: DC

## 2014-10-29 MED ORDER — TIZANIDINE HCL 2 MG PO CAPS
ORAL_CAPSULE | ORAL | Status: DC
Start: 2014-10-29 — End: 2015-01-26

## 2014-10-29 MED ORDER — TAPENTADOL HCL ER 100 MG PO TB12: ORAL_TABLET | ORAL | Status: DC

## 2014-10-29 NOTE — Progress Notes (Signed)
Safety precautions to be maintained throughout the outpatient stay will include: orient to surroundings, keep bed in low position, maintain call bell within reach at all times, provide assistance with transfer out of bed and ambulation.  

## 2014-10-29 NOTE — Progress Notes (Signed)
   Subjective:    Patient ID: Annette [name], female    DOB: [birth year], 54 y.o.   MRN: [id number]  HPI Patient 54 year old female returns to pain management for further evaluation and treatment of pain involving the neck headaches upper extremity regions shoulders upper mid and lower back and lower extremity region. Patient had improvement of her pain with use of Nucynta extended release. Patient states that the medication does not appear to last 12 hours and that she needs to use significant amounts of oxycodone for breakthrough pain. We discussed patient's condition and discuss modifying patient's Nucynta dose. Degenerative joint we will unable to prescribed the dose of Nucynta eat all which we wish to prescribe the patient. The patient will continue oxycodone for breakthrough pain and will be considered for additional modification of treatment pending follow-up evaluation.        Review of Systems     Objective:   Physical Exam There was tenderness of the splenius capitis Cipro talus musculature region of moderate degree. Palpation of these regions reproduce moderate discomfort. Palpation of the trapezius levator scapula rhomboid musculature is associated with moderate discomfort. Patient appeared to be with bilaterally equal grip strength. Tinel and Phalen's maneuver were without increase of pain significant degree. There was tenderness to palpation over the cervical facet cervical paraspinal musculature region as well as the thoracic facet thoracic paraspinal musculature region. No crepitus of the thoracic region was noted. Palpation of the lumbar paraspinal musculature region lumbar facet region associated with moderate discomfort. Lateral bending and rotation extension and palpation of the lumbar facets reproduce moderate discomfort. There was moderate tenderness of the greater trochanteric region iliotibial band region as well as the PSIS and PII S region and gluteal and piriformis  musculature regions. Straight leg raising limited to 20 with decreased EHL strength on the right compared to the left with negative clonus negative Homans. No definite sensory deficit of dermatomal distribution detected. Abdomen was nontender with no costovertebral angle tenderness noted.      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 Quay Burow for evaliation of  BP and general medical  condition  F/U surgical evaluation. May consider pending follow-up evaluations  F/U neurological evaluation. May consider pending follow-up evaluations  F/U psych evaluation  May consider radiofrequency rhizolysis or intraspinal procedures pending response to present treatment and F/U evaluation   Patient to call Pain Management Center should patient have concerns prior to scheduled return appointment.

## 2014-10-29 NOTE — Patient Instructions (Addendum)
PLAN   Continue present medication Neurontin Zanaflex oxycodone and Nucynta ER  F/U PCP see Dr. Quay Burow  for evaliation of  BP and general medical  condition  F/U surgical evaluation. May consider pending follow-up evaluations  F/U neurological evaluation. May consider pending follow-up evaluations  F/U psych evaluation  as planned  May consider radiofrequency rhizolysis or intraspinal procedures pending response to present treatment and F/U evaluation   Patient to call Pain Management Center should patient have concerns prior to scheduled return appointment.

## 2014-11-11 ENCOUNTER — Other Ambulatory Visit: Payer: Self-pay | Admitting: Pain Medicine

## 2014-11-26 ENCOUNTER — Encounter: Payer: Self-pay | Admitting: Pain Medicine

## 2014-11-26 ENCOUNTER — Ambulatory Visit: Payer: Medicare Other | Attending: Pain Medicine | Admitting: Pain Medicine

## 2014-11-26 VITALS — BP 145/85 | HR 92 | Temp 99.1°F | Resp 15 | Ht 65.0 in | Wt 283.0 lb

## 2014-11-26 DIAGNOSIS — M19019 Primary osteoarthritis, unspecified shoulder: Secondary | ICD-10-CM | POA: Diagnosis not present

## 2014-11-26 DIAGNOSIS — M47816 Spondylosis without myelopathy or radiculopathy, lumbar region: Secondary | ICD-10-CM | POA: Diagnosis not present

## 2014-11-26 DIAGNOSIS — G90521 Complex regional pain syndrome I of right lower limb: Secondary | ICD-10-CM | POA: Diagnosis not present

## 2014-11-26 DIAGNOSIS — M79601 Pain in right arm: Secondary | ICD-10-CM | POA: Diagnosis present

## 2014-11-26 DIAGNOSIS — G5701 Lesion of sciatic nerve, right lower limb: Secondary | ICD-10-CM

## 2014-11-26 DIAGNOSIS — M5481 Occipital neuralgia: Secondary | ICD-10-CM

## 2014-11-26 DIAGNOSIS — G588 Other specified mononeuropathies: Secondary | ICD-10-CM | POA: Insufficient documentation

## 2014-11-26 DIAGNOSIS — M79602 Pain in left arm: Secondary | ICD-10-CM | POA: Diagnosis present

## 2014-11-26 DIAGNOSIS — M5136 Other intervertebral disc degeneration, lumbar region: Secondary | ICD-10-CM | POA: Diagnosis not present

## 2014-11-26 DIAGNOSIS — Z96649 Presence of unspecified artificial hip joint: Secondary | ICD-10-CM | POA: Insufficient documentation

## 2014-11-26 DIAGNOSIS — M533 Sacrococcygeal disorders, not elsewhere classified: Secondary | ICD-10-CM | POA: Insufficient documentation

## 2014-11-26 DIAGNOSIS — E1340 Other specified diabetes mellitus with diabetic neuropathy, unspecified: Secondary | ICD-10-CM

## 2014-11-26 DIAGNOSIS — M542 Cervicalgia: Secondary | ICD-10-CM | POA: Diagnosis present

## 2014-11-26 DIAGNOSIS — M51369 Other intervertebral disc degeneration, lumbar region without mention of lumbar back pain or lower extremity pain: Secondary | ICD-10-CM

## 2014-11-26 DIAGNOSIS — M5030 Other cervical disc degeneration, unspecified cervical region: Secondary | ICD-10-CM

## 2014-11-26 DIAGNOSIS — G5700 Lesion of sciatic nerve, unspecified lower limb: Secondary | ICD-10-CM

## 2014-11-26 MED ORDER — OXYCODONE HCL 10 MG PO TABS: ORAL_TABLET | ORAL | Status: DC

## 2014-11-26 MED ORDER — TAPENTADOL HCL ER 100 MG PO TB12: ORAL_TABLET | ORAL | Status: DC

## 2014-11-26 NOTE — Patient Instructions (Addendum)
PLAN   Continue present medication Neurontin Zanaflex oxycodone and Nucynta ER  Greater occipital nerve block to be performed at time return appointment  F/U PCP Dr. Quay Burow for evaliation of  BP and general medical  condition  F/U surgical evaluation. May consider pending follow-up evaluations  F/U neurological evaluation. May consider pending follow-up evaluations  Ask the staff when you will be approved for radiofrequency rhizolysis lumbar facets  May consider radiofrequency rhizolysis or intraspinal procedures pending response to present treatment and F/U evaluation   Patient to call Pain Management Center should patient have concerns prior to scheduled return appointment.

## 2014-11-26 NOTE — Progress Notes (Signed)
Subjective:    Patient ID: Annette [name], female    DOB: 07-13-1960, 54 y.o.   MRN: [id number]  HPI  Patient 54 year old female returns to [facility] for further evaluation and treatment of pain involving the neck upper extremity regions entire back upper and lower extremity regions. Patient states that pain of the neck radiates to the back of the hip precipitating with severe headaches. Patient states the headaches are present almost every day. We discussed patient's condition noted patient to be significant muscle spasms occurring in the region of the cervical and thoracic region. Palpation of the cervical paraspinal musculature region reproduced pain with radiation of pain to the occipital region precipitating headaches. We'll discuss patient's overall condition and will proceed with greater occipital nerve blocks at time return appointment in attempt to decrease frequency of patient's headaches. Patient also with lower back lower extremity pain with significant muscle spasms of the paraspinal musculature region of the lumbar and thoracic regions which also contributed to spasms of the cervical region precipitating headaches and also resulting in severe lumbar lower extremity pain felt to be due to multilevel degenerative changes of the lumbar spine we will request approval for radiofrequency rhizolysis of the lumbar facets and we'll proceed with greater occipital nerve blocks for headaches at time return appointment is discussed with patient on today's visit is understanding and in agreement with suggested treatment plan  Review of Systems     Objective:   Physical Exam There was moderate to moderately severe tends to palpation splenius capitis and occipitalis region. Palpation over the cervical facet cervical paraspinal muscles region reproduced moderate to moderate severe discomfort. There was no excessive tends to palpation of the sinuses or the temporomandibular joint region and no  bounding pulsations of the temporal region were noted. Over the splenius capitis and occipitalis region reproduced severe discomfort facilitating headaches after palpation. Was tends to palpation over the thoracic facet thoracic paraspinal musculature region without crepitus of the thoracic region noted. There was minimal tenderness of the region of the acromioclavicular glenohumeral joint region. Palpation of the thoracic facet thoracic paraspinal muscles was with moderate tends to palpation lateral bending and rotation associated with moderate to moderate severe discomfort. There was moderate to moderately severe tenderness to palpation over the lumbar facet lumbar paraspinal musculature region. Palpation of the PSIS and PII S region reproduced mild discomfort to moderate discomfort. Straight leg raising was tolerates approximately 20 without increased pain with dorsiflexion noted there was significantly decreased EHL strength on the right compared to the left. There was negative clonus negative Homans. Abdomen was protuberant without excessive tends to palpation and no costovertebral maintenance was noted.       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 Scheryl Darter for evaliation of  BP and general medical  condition  F/U surgical evaluation. May  consider pending follow-up evaluations  F/U neurological evaluation. May consider pending follow-up evaluations  We will request approval for radiofrequency rhizolysis lumbar facet, medial branch nerves in attempt to decrease severity of patient's lumbar and lower extremity pain paresthesias, minimize progression of symptoms, and avoid need for more involved treatment.   Patient to call Pain Management Center should patient have concerns prior to scheduled return appointment.

## 2014-11-26 NOTE — Progress Notes (Signed)
Safety precautions to be maintained throughout the outpatient stay will include: orient to surroundings, keep bed in low position, maintain call bell within reach at all times, provide assistance with transfer out of bed and ambulation.  

## 2014-12-23 ENCOUNTER — Encounter: Payer: Self-pay | Admitting: Pain Medicine

## 2014-12-23 ENCOUNTER — Ambulatory Visit: Payer: Medicare Other | Attending: Pain Medicine | Admitting: Pain Medicine

## 2014-12-23 ENCOUNTER — Other Ambulatory Visit: Payer: Self-pay | Admitting: Pain Medicine

## 2014-12-23 VITALS — BP 132/87 | HR 68 | Temp 97.6°F | Resp 18 | Ht 65.0 in | Wt 285.0 lb

## 2014-12-23 DIAGNOSIS — M47812 Spondylosis without myelopathy or radiculopathy, cervical region: Secondary | ICD-10-CM | POA: Insufficient documentation

## 2014-12-23 DIAGNOSIS — E1340 Other specified diabetes mellitus with diabetic neuropathy, unspecified: Secondary | ICD-10-CM

## 2014-12-23 DIAGNOSIS — G5700 Lesion of sciatic nerve, unspecified lower limb: Secondary | ICD-10-CM

## 2014-12-23 DIAGNOSIS — M5481 Occipital neuralgia: Secondary | ICD-10-CM

## 2014-12-23 DIAGNOSIS — M542 Cervicalgia: Secondary | ICD-10-CM | POA: Diagnosis not present

## 2014-12-23 DIAGNOSIS — R51 Headache: Secondary | ICD-10-CM | POA: Diagnosis present

## 2014-12-23 DIAGNOSIS — M5136 Other intervertebral disc degeneration, lumbar region: Secondary | ICD-10-CM

## 2014-12-23 DIAGNOSIS — M51369 Other intervertebral disc degeneration, lumbar region without mention of lumbar back pain or lower extremity pain: Secondary | ICD-10-CM

## 2014-12-23 DIAGNOSIS — M47816 Spondylosis without myelopathy or radiculopathy, lumbar region: Secondary | ICD-10-CM

## 2014-12-23 DIAGNOSIS — G5701 Lesion of sciatic nerve, right lower limb: Secondary | ICD-10-CM

## 2014-12-23 DIAGNOSIS — M5030 Other cervical disc degeneration, unspecified cervical region: Secondary | ICD-10-CM

## 2014-12-23 MED ORDER — MIDAZOLAM HCL 5 MG/5ML IJ SOLN
INTRAMUSCULAR | Status: AC
Start: 2014-12-23 — End: 2014-12-23
  Administered 2014-12-23: 4 mg via INTRAVENOUS
  Filled 2014-12-23: qty 5

## 2014-12-23 MED ORDER — TRIAMCINOLONE ACETONIDE 40 MG/ML IJ SUSP
INTRAMUSCULAR | Status: AC
  Filled 2014-12-23: qty 1

## 2014-12-23 MED ORDER — ORPHENADRINE CITRATE 30 MG/ML IJ SOLN: 60.0000 mg | Freq: Once | INTRAMUSCULAR | Status: DC

## 2014-12-23 MED ORDER — OXYCODONE HCL 10 MG PO TABS: ORAL_TABLET | ORAL | Status: DC

## 2014-12-23 MED ORDER — TAPENTADOL HCL ER 100 MG PO TB12: ORAL_TABLET | ORAL | Status: DC

## 2014-12-23 MED ORDER — FENTANYL CITRATE (PF) 100 MCG/2ML IJ SOLN
100.0000 ug | Freq: Once | INTRAMUSCULAR | Status: AC
  Administered 2014-12-23: 100 ug via INTRAVENOUS

## 2014-12-23 MED ORDER — BUPIVACAINE HCL (PF) 0.25 % IJ SOLN
30.0000 mL | Freq: Once | INTRAMUSCULAR | Status: AC
  Administered 2014-12-23: 20 mL

## 2014-12-23 MED ORDER — TRIAMCINOLONE ACETONIDE 40 MG/ML IJ SUSP: 40.0000 mg | Freq: Once | INTRAMUSCULAR | Status: DC

## 2014-12-23 MED ORDER — BUPIVACAINE HCL (PF) 0.25 % IJ SOLN
INTRAMUSCULAR | Status: AC
Start: 2014-12-23 — End: 2014-12-23
  Administered 2014-12-23: 20 mL
  Filled 2014-12-23: qty 30

## 2014-12-23 MED ORDER — MIDAZOLAM HCL 5 MG/5ML IJ SOLN
5.0000 mg | Freq: Once | INTRAMUSCULAR | Status: AC
  Administered 2014-12-23: 4 mg via INTRAVENOUS

## 2014-12-23 MED ORDER — FENTANYL CITRATE (PF) 100 MCG/2ML IJ SOLN
INTRAMUSCULAR | Status: AC
  Administered 2014-12-23: 100 ug via INTRAVENOUS
  Filled 2014-12-23: qty 2

## 2014-12-23 MED ORDER — LACTATED RINGERS IV SOLN: 1000.0000 mL | INTRAVENOUS | Status: DC

## 2014-12-23 MED ORDER — ORPHENADRINE CITRATE 30 MG/ML IJ SOLN
INTRAMUSCULAR | Status: AC
  Filled 2014-12-23: qty 2

## 2014-12-23 MED ORDER — KETOROLAC TROMETHAMINE 60 MG/2ML IM SOLN
INTRAMUSCULAR | Status: AC
Start: 2014-12-23 — End: 2014-12-23
  Administered 2014-12-23: 60 mg
  Filled 2014-12-23: qty 2

## 2014-12-23 NOTE — Progress Notes (Signed)
   Subjective:    Patient ID: Annette [name], female    DOB: Jun 29, 1960, 54 y.o.   MRN: [id number]  HPI  NOTE: The patient is a 54 y.o.-year-old female who returns to the [facility] Management Center for further evaluation and treatment of pain consisting of pain involving the region of the neck and headache.  Patient is with prior studies revealing patient to be with degenerative changes of the cervical spine. The patient is with pain radiating from the neck to the back of the head and continuing to the retro-orbital region. There is concern regarding significant component of pain due to greater occipital neuralgia myofascial pain related headaches .  The risks, benefits, and expectations of the procedure have been discussed and explained to patient, who is understanding and wishes to proceed with interventional treatment as discussed and as explained to patient.  Will proceed with greater occipital nerve blocks with myoneural block injections at this time as discussed and as explained to patient.  All are understanding and in agreement with suggested treatment plan.    PROCEDURE:  Greater occipital nerve block on the left side with IV Versed, IV Fentanyl, conscious sedation, EKG, blood pressure, pulse, pulse oximetry monitoring.  Procedure performed with patient in prone position.  Greater occipital nerve block on the left side.   With patient in prone position, Hibiclens prep of proposed entry site accomplished.  Following identification of the nuchal ridge, 22 -gauge needle was inserted at the level of the nuchal ridge medial to the occipital artery.  Following negative aspiration, 4cc 0.25% bupivacaine with Toradol injected for left greater occipital nerve block.  Needle was removed.  Patient tolerated injection well.   Greater occipital nerve block on the rightt side. The greater occipital nerve block on the right side was performed exactly as the left greater occipital nerve block was performed and  utilizing the same technique.  Myoneural block injections of the cervical region Following Hibiclens prep of proposed entry site a total of 2 cc of 0.25% bupivacaine was injected into the cervical musculature region following negative aspiration. Myoneural block injections of the cervical region performed 4.   The patient tolerated procedure well   A total of 60 mg of Toradol was utilized for the entire procedure.  PLAN:   Dr. Quay Burow 1. Medications: Will continue presently prescribed medications at this time. 2. Patient to follow up with primary care physician  for evaluation of blood pressure and general medical condition status post procedure performed on today's visit. The patient will see Dr. Quay Burow this week for evaluation of cardiopulmonary status as discussed. Patient denies significant chest pain or shortness of breath at this time 3. Neurological evaluation for further assessment of headaches for further studies as discussed. 4. Surgical evaluation as discussed.  5. Patient may be candidate for Botox injections, radiofrequency procedures, as well as implantation type procedures pending response to treatment rendered on today's visit and pending follow-up evaluation. 6. Patient has been advised to adhere to proper body mechanics and to avoid activities which appear to aggravate condition.cations:  Will continue presently prescribed medications at this time. 7. The patient is understanding and in agreement with the suggested treatment plan.   Review of Systems     Objective:   Physical Exam        Assessment & Plan:

## 2014-12-23 NOTE — Patient Instructions (Addendum)
PLAN   Continue present medication Neurontin Zanaflex oxycodone Nucynta ER and continue amoxicillin antibiotic  F/U PCP Dr. Quay Burow for evaliation of  BP and general medical  condition this week as we discussed   F/U surgical evaluation. May consider pending follow-up evaluations  F/U neurological evaluation. May consider pending follow-up evaluations  Ask the staff when you will be approved for radiofrequency rhizolysis lumbar facets  May consider radiofrequency rhizolysis or intraspinal procedures pending response to present treatment and F/U evaluation   Patient to call Pain Management Center should patient have concerns prior to scheduled return appointment.Pain Management Discharge Instructions  General Discharge Instructions :  If you need to reach your doctor call: Monday-Friday 8:00 am - 4:00 pm at [phone] or toll free [phone].  After clinic hours [phone] to have operator reach doctor.  Bring all of your medication bottles to all your appointments in the pain clinic.  To cancel or reschedule your appointment with Pain Management please remember to call 24 hours in advance to avoid a fee.  Refer to the educational materials which you have been given on: General Risks, I had my Procedure. Discharge Instructions, Post Sedation.  Post Procedure Instructions:  The drugs you were given will stay in your system until tomorrow, so for the next 24 hours you should not drive, make any legal decisions or drink any alcoholic beverages.  You may eat anything you prefer, but it is better to start with liquids then soups and crackers, and gradually work up to solid foods.  Please notify your doctor immediately if you have any unusual bleeding, trouble breathing or pain that is not related to your normal pain.  Depending on the type of procedure that was done, some parts of your body may feel week and/or numb.  This usually clears up by tonight or the next day.  Walk with  the use of an assistive device or accompanied by an adult for the 24 hours.  You may use ice on the affected area for the first 24 hours.  Put ice in a Ziploc bag and cover with a towel and place against area 15 minutes on 15 minutes off.  You may switch to heat after 24 hours.Occipital Nerve Block Patient Information  Description: The occipital nerves originate in the cervical (neck) spinal cord and travel upward through muscle and tissue to supply sensation to the back of the head and top of the scalp.  In addition, the nerves control some of the muscles of the scalp.  Occipital neuralgia is an irritation of these nerves which can cause headaches, numbness of the scalp, and neck discomfort.     The occipital nerve block will interrupt nerve transmission through these nerves and can relieve pain and spasm.  The block consists of insertion of a small needle under the skin in the back of the head to deposit local anesthetic (numbing medicine) and/or steroids around the nerve.  The entire block usually lasts less than 5 minutes.  Conditions which may be treated by occipital blocks:   Muscular pain and spasm of the scalp  Nerve irritation, back of the head  Headaches  Upper neck pain  Preparation for the injection:  1. Do not eat any solid food or dairy products within 6 hours of your appointment. 2. You may drink clear liquids up to 2 hours before appointment.  Clear liquids include water, black coffee, juice or soda.  No milk or cream please. 3. You may take your regular medication, including  pain medications, with a sip of water before you appointment.  Diabetics should hold regular insulin (if taken separately) and take 1/2 normal NPH dose the morning of the procedure.  Carry some sugar containing items with you to your appointment. 4. A driver must accompany you and be prepared to drive you home after your procedure. 5. Bring all your current medications with you. 6. An IV may be inserted  and sedation may be given at the discretion of the physician. 7. A blood pressure cuff, EKG, and other monitors will often be applied during the procedure.  Some patients may need to have extra oxygen administered for a short period. 8. You will be asked to provide medical information, including your allergies and medications, prior to the procedure.  We must know immediately if you are taking blood thinners (like Coumadin/Warfarin) or if you are allergic to IV iodine contrast (dye).  We must know if you could possible be pregnant.  9. Do not wear a high collared shirt or turtleneck.  Tie long hair up in the back if possible.  Possible side-effects:   Bleeding from needle site  Infection (rare, may require surgery)  Nerve injury (rare)  Hair on back of neck can be tinged with iodine scrub (this will wash out)  Light-headedness (temporary)  Pain at injection site (several days)  Decreased blood pressure (rare, temporary)  Seizure (very rare)  Call if you experience:   Hives or difficulty breathing ( go to the emergency room)  Inflammation or drainage at the injection site(s)  Please note:  Although the local anesthetic injected can often make your painful muscles or headache feel good for several hours after the injection, the pain may return.  It takes 3-7 days for steroids to work.  You may not notice any pain relief for at least one week.  If effective, we will often do a series of injections spaced 3-6 weeks apart to maximally decrease your pain.  If you have any questions, please call [phone] [facility]

## 2014-12-23 NOTE — Progress Notes (Signed)
Safety precautions to be maintained throughout the outpatient stay will include: orient to surroundings, keep bed in low position, maintain call bell within reach at all times, provide assistance with transfer out of bed and ambulation.  

## 2014-12-24 ENCOUNTER — Telehealth: Payer: Self-pay | Admitting: *Deleted

## 2014-12-24 NOTE — Telephone Encounter (Signed)
Left voicemail re; procedure on 12/23/2014, to call our office with any problems or concerns.

## 2015-01-12 ENCOUNTER — Ambulatory Visit: Payer: Medicare Other | Admitting: Pain Medicine

## 2015-01-26 ENCOUNTER — Encounter: Payer: Self-pay | Admitting: Pain Medicine

## 2015-01-26 ENCOUNTER — Ambulatory Visit: Payer: Medicare Other | Attending: Pain Medicine | Admitting: Pain Medicine

## 2015-01-26 VITALS — BP 144/81 | HR 80 | Temp 98.5°F | Resp 18 | Ht 65.0 in | Wt 287.0 lb

## 2015-01-26 DIAGNOSIS — M47816 Spondylosis without myelopathy or radiculopathy, lumbar region: Secondary | ICD-10-CM | POA: Diagnosis not present

## 2015-01-26 DIAGNOSIS — Z96649 Presence of unspecified artificial hip joint: Secondary | ICD-10-CM | POA: Diagnosis not present

## 2015-01-26 DIAGNOSIS — M5481 Occipital neuralgia: Secondary | ICD-10-CM | POA: Diagnosis not present

## 2015-01-26 DIAGNOSIS — G589 Mononeuropathy, unspecified: Secondary | ICD-10-CM | POA: Insufficient documentation

## 2015-01-26 DIAGNOSIS — M533 Sacrococcygeal disorders, not elsewhere classified: Secondary | ICD-10-CM | POA: Diagnosis not present

## 2015-01-26 DIAGNOSIS — Z9889 Other specified postprocedural states: Secondary | ICD-10-CM | POA: Insufficient documentation

## 2015-01-26 DIAGNOSIS — G5700 Lesion of sciatic nerve, unspecified lower limb: Secondary | ICD-10-CM

## 2015-01-26 DIAGNOSIS — M542 Cervicalgia: Secondary | ICD-10-CM | POA: Diagnosis present

## 2015-01-26 DIAGNOSIS — R51 Headache: Secondary | ICD-10-CM | POA: Diagnosis present

## 2015-01-26 DIAGNOSIS — M5136 Other intervertebral disc degeneration, lumbar region: Secondary | ICD-10-CM

## 2015-01-26 DIAGNOSIS — M51369 Other intervertebral disc degeneration, lumbar region without mention of lumbar back pain or lower extremity pain: Secondary | ICD-10-CM

## 2015-01-26 DIAGNOSIS — G5701 Lesion of sciatic nerve, right lower limb: Secondary | ICD-10-CM

## 2015-01-26 DIAGNOSIS — M5030 Other cervical disc degeneration, unspecified cervical region: Secondary | ICD-10-CM | POA: Diagnosis not present

## 2015-01-26 DIAGNOSIS — M546 Pain in thoracic spine: Secondary | ICD-10-CM | POA: Diagnosis present

## 2015-01-26 DIAGNOSIS — E1340 Other specified diabetes mellitus with diabetic neuropathy, unspecified: Secondary | ICD-10-CM

## 2015-01-26 MED ORDER — TAPENTADOL HCL ER 100 MG PO TB12: ORAL_TABLET | ORAL | Status: DC

## 2015-01-26 MED ORDER — GABAPENTIN 600 MG PO TABS: ORAL_TABLET | ORAL | Status: DC

## 2015-01-26 MED ORDER — OXYCODONE HCL 10 MG PO TABS: ORAL_TABLET | ORAL | Status: DC

## 2015-01-26 MED ORDER — TIZANIDINE HCL 2 MG PO CAPS: ORAL_CAPSULE | ORAL | Status: DC

## 2015-01-26 MED ORDER — TAPENTADOL HCL ER 150 MG PO TB12: ORAL_TABLET | ORAL | Status: DC

## 2015-01-26 NOTE — Progress Notes (Signed)
Safety precautions to be maintained throughout the outpatient stay will include: orient to surroundings, keep bed in low position, maintain call bell within reach at all times, provide assistance with transfer out of bed and ambulation.  

## 2015-01-26 NOTE — Progress Notes (Signed)
Subjective:    Patient ID: Annette [name], female    DOB: February 02, 1961, 54 y.o.   MRN: [id number]  HPI  The patient is a 54 year old female who returns to pain management for further evaluation and treatment of pain involving the neck headaches as well as pain involving the upper mid lower back and lower extremity regions patient has had some improvement of headaches with prior greater occipital nerve block. Patient is a pain involving the lower back lower extremity region of significant degree. We have discussed additional treatment at the present time patient states that her insurance wouldn't prefer patient take the Nucynta ER 150 mg every 12 hours instead of Nucynta ER 100 mg every 8 hours. We discussed patient's condition and decision made begin Nucynta ER 150 mg every 12 hours. Patient was cautioned regarding respiratory depression excessive sedation confusion and other side effects which can occur with the medications. We will observe response to present treatment and will consider modification of treatment pending follow-up evaluation. The patient will continue other medications as prescribed. Patient was understanding and agreement suggested treatment plan patient denies trauma change in events of daily living the call significant change in symptomatology. Patient states that she plans to resume exercising and to resume diet await reduction as well. All agreed to suggested treatment plan     Review of Systems     Objective:   Physical Exam  There was tenderness of the splenius capitis and occipitalis musculature region a moderate degree. There were no masses of the hip negative noted. Palpation over the cervical facet cervical paraspinal musculature region reproduced pain of mild-to-moderate degree. There appeared to be unremarkable Spurling's maneuver and patient appeared to be with slightly decreased grip strength with Tinel and Phalen's maneuver reproducing mild discomfort. Patient  appeared to be with tenderness to palpation of the acromioclavicular and glenohumeral joint regions reproducing minimal discomfort. Palpation over the thoracic facet thoracic paraspinal musculature he was assessed to palpation of moderate degree. Moderate muscle spasms were noted thoracic musculature region. Palpation over the lumbar paraspinal must reason lumbar facet region was with moderate to moderately severe discomfort. Lateral bending rotation extension and palpation of the lumbar facets reproduce moderate to moderately severe discomfort. There was tenderness of the PSIS and PII S region as well as the gluteal and piriformis musculature region. There was question decreased sensation of the lower extremities and L5-S1 dermatomal distribution. There was decreased EHL strength on the right compared to the left. There was negative Homans. Palpation of the PSIS and PII S region reproduces moderate discomfort. There was moderate tensed palpation of the greater trochanteric region iliotibial band region. Abdomen protuberant without excessive tends to palpation and no costovertebral tenderness noted.      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 Caution pill is stronger Nucynta ER and is 150 mg instead of 100 mg and can cause i respiratory depression stopping breathing excessive sedation confusion and other side effects   F/U PCP Dr. Quay Burow for evaliation of  BP and general medical  condition  F/U surgical evaluation. May consider  pending follow-up  evaluations  F/U neurological evaluation. May consider pending follow-up evaluations  Ask the staff when you will be approved for radiofrequency rhizolysis lumbar facets  May consider radiofrequency rhizolysis or intraspinal procedures pending response to present treatment and F/U evaluation   Patient to call Pain Management Center should patient have concerns prior to scheduled return appointment.

## 2015-01-26 NOTE — Patient Instructions (Addendum)
PLAN   Continue present medication Neurontin Zanaflex oxycodone and Nucynta ER. Caution pill is stronger Nucynta ER and can cause i respiratory depression stopping breathing excessive sedation confusion and other side effects   F/U PCP Dr. Quay Burow for evaliation of  BP and general medical  condition  F/U surgical evaluation. May consider pending follow-up evaluations  F/U neurological evaluation. May consider pending follow-up evaluations  Ask the staff when you will be approved for radiofrequency rhizolysis lumbar facets  May consider radiofrequency rhizolysis or intraspinal procedures pending response to present treatment and F/U evaluation   Patient to call Pain Management Center should patient have concerns prior to scheduled return appointment.

## 2015-02-12 ENCOUNTER — Other Ambulatory Visit: Payer: Self-pay | Admitting: Pain Medicine

## 2015-02-25 ENCOUNTER — Ambulatory Visit: Payer: Medicare Other | Attending: Pain Medicine | Admitting: Pain Medicine

## 2015-02-25 ENCOUNTER — Encounter: Payer: Self-pay | Admitting: Pain Medicine

## 2015-02-25 VITALS — BP 120/63 | HR 81 | Temp 98.5°F | Resp 16 | Ht 65.0 in | Wt 287.0 lb

## 2015-02-25 DIAGNOSIS — M5481 Occipital neuralgia: Secondary | ICD-10-CM

## 2015-02-25 DIAGNOSIS — M546 Pain in thoracic spine: Secondary | ICD-10-CM | POA: Diagnosis present

## 2015-02-25 DIAGNOSIS — Z96649 Presence of unspecified artificial hip joint: Secondary | ICD-10-CM | POA: Insufficient documentation

## 2015-02-25 DIAGNOSIS — E1340 Other specified diabetes mellitus with diabetic neuropathy, unspecified: Secondary | ICD-10-CM

## 2015-02-25 DIAGNOSIS — M5030 Other cervical disc degeneration, unspecified cervical region: Secondary | ICD-10-CM | POA: Diagnosis not present

## 2015-02-25 DIAGNOSIS — G5701 Lesion of sciatic nerve, right lower limb: Secondary | ICD-10-CM

## 2015-02-25 DIAGNOSIS — R51 Headache: Secondary | ICD-10-CM | POA: Diagnosis present

## 2015-02-25 DIAGNOSIS — M47816 Spondylosis without myelopathy or radiculopathy, lumbar region: Secondary | ICD-10-CM | POA: Diagnosis not present

## 2015-02-25 DIAGNOSIS — M5136 Other intervertebral disc degeneration, lumbar region: Secondary | ICD-10-CM

## 2015-02-25 DIAGNOSIS — M542 Cervicalgia: Secondary | ICD-10-CM | POA: Diagnosis present

## 2015-02-25 DIAGNOSIS — G5700 Lesion of sciatic nerve, unspecified lower limb: Secondary | ICD-10-CM

## 2015-02-25 MED ORDER — OXYCODONE HCL 10 MG PO TABS: ORAL_TABLET | ORAL | Status: DC

## 2015-02-25 MED ORDER — TAPENTADOL HCL ER 150 MG PO TB12: ORAL_TABLET | ORAL | Status: DC

## 2015-02-25 NOTE — Patient Instructions (Addendum)
PLAN   Continue present medication Neurontin Zanaflex oxycodone and Nucynta ER. Caution pill is stronger Nucynta ER and can cause i respiratory depression stopping breathing excessive sedation confusion and other side effects as we reminded you at time of previous visit   Greater occipital nerve block to be performed at time of return appointment  F/U PCP Dr. Quay Burow for evaliation of  BP and general medical  condition  F/U surgical evaluation. May consider pending follow-up evaluations  F/U neurological evaluation. May consider pending follow-up evaluations  Ask the staff when you will be approved for radiofrequency rhizolysis lumbar facets  May consider radiofrequency rhizolysis or intraspinal procedures pending response to present treatment and F/U evaluation   Patient to call Pain Management Center should patient have concerns prior to scheduled return appointment.Pain Management Discharge Instructions  General Discharge Instructions :  If you need to reach your doctor call: Monday-Friday 8:00 am - 4:00 pm at [phone] or toll free [phone].  After clinic hours [phone] to have operator reach doctor.  Bring all of your medication bottles to all your appointments in the pain clinic.  To cancel or reschedule your appointment with Pain Management please remember to call 24 hours in advance to avoid a fee.  Refer to the educational materials which you have been given on: General Risks, I had my Procedure. Discharge Instructions, Post Sedation.  Post Procedure Instructions:  The drugs you were given will stay in your system until tomorrow, so for the next 24 hours you should not drive, make any legal decisions or drink any alcoholic beverages.  You may eat anything you prefer, but it is better to start with liquids then soups and crackers, and gradually work up to solid foods.  Please notify your doctor immediately if you have any unusual bleeding, trouble breathing or  pain that is not related to your normal pain.  Depending on the type of procedure that was done, some parts of your body may feel week and/or numb.  This usually clears up by tonight or the next day.  Walk with the use of an assistive device or accompanied by an adult for the 24 hours.  You may use ice on the affected area for the first 24 hours.  Put ice in a Ziploc bag and cover with a towel and place against area 15 minutes on 15 minutes off.  You may switch to heat after 24 hours.Occipital Nerve Block Patient Information  Description: The occipital nerves originate in the cervical (neck) spinal cord and travel upward through muscle and tissue to supply sensation to the back of the head and top of the scalp.  In addition, the nerves control some of the muscles of the scalp.  Occipital neuralgia is an irritation of these nerves which can cause headaches, numbness of the scalp, and neck discomfort.     The occipital nerve block will interrupt nerve transmission through these nerves and can relieve pain and spasm.  The block consists of insertion of a small needle under the skin in the back of the head to deposit local anesthetic (numbing medicine) and/or steroids around the nerve.  The entire block usually lasts less than 5 minutes.  Conditions which may be treated by occipital blocks:   Muscular pain and spasm of the scalp  Nerve irritation, back of the head  Headaches  Upper neck pain  Preparation for the injection:  1. Do not eat any solid food or dairy products within 6 hours of your appointment. 2.  You may drink clear liquids up to 2 hours before appointment.  Clear liquids include water, black coffee, juice or soda.  No milk or cream please. 3. You may take your regular medication, including pain medications, with a sip of water before you appointment.  Diabetics should hold regular insulin (if taken separately) and take 1/2 normal NPH dose the morning of the procedure.  Carry some  sugar containing items with you to your appointment. 4. A driver must accompany you and be prepared to drive you home after your procedure. 5. Bring all your current medications with you. 6. An IV may be inserted and sedation may be given at the discretion of the physician. 7. A blood pressure cuff, EKG, and other monitors will often be applied during the procedure.  Some patients may need to have extra oxygen administered for a short period. 8. You will be asked to provide medical information, including your allergies and medications, prior to the procedure.  We must know immediately if you are taking blood thinners (like Coumadin/Warfarin) or if you are allergic to IV iodine contrast (dye).  We must know if you could possible be pregnant.  9. Do not wear a high collared shirt or turtleneck.  Tie long hair up in the back if possible.  Possible side-effects:   Bleeding from needle site  Infection (rare, may require surgery)  Nerve injury (rare)  Hair on back of neck can be tinged with iodine scrub (this will wash out)  Light-headedness (temporary)  Pain at injection site (several days)  Decreased blood pressure (rare, temporary)  Seizure (very rare)  Call if you experience:   Hives or difficulty breathing ( go to the emergency room)  Inflammation or drainage at the injection site(s)  Please note:  Although the local anesthetic injected can often make your painful muscles or headache feel good for several hours after the injection, the pain may return.  It takes 3-7 days for steroids to work.  You may not notice any pain relief for at least one week.  If effective, we will often do a series of injections spaced 3-6 weeks apart to maximally decrease your pain.  If you have any questions, please call [phone] [place]  What are the risk, side effects and possible complications? Generally speaking, most  procedures are safe.  However, with any procedure there are risks, side effects, and the possibility of complications.  The risks and complications are dependent upon the sites that are lesioned, or the type of nerve block to be performed.  The closer the procedure is to the spine, the more serious the risks are.  Great care is taken when placing the radio frequency needles, block needles or lesioning probes, but sometimes complications can occur. 1. Infection: Any time there is an injection through the skin, there is a risk of infection.  This is why sterile conditions are used for these blocks.  There are four possible types of infection. 1. Localized skin infection. 2. Central Nervous System Infection-This can be in the form of Meningitis, which can be deadly. 3. Epidural Infections-This can be in the form of an epidural abscess, which can cause pressure inside of the spine, causing compression of the spinal cord with subsequent paralysis. This would require an emergency surgery to decompress, and there are no guarantees that the patient would recover from the paralysis. 4. Discitis-This is an infection of the intervertebral discs.  It occurs in  about 1% of discography procedures.  It is difficult to treat and it may lead to surgery.        2. Pain: the needles have to go through skin and soft tissues, will cause soreness.       3. Damage to internal structures:  The nerves to be lesioned may be near blood vessels or    other nerves which can be potentially damaged.       4. Bleeding: Bleeding is more common if the patient is taking blood thinners such as  aspirin, Coumadin, Ticiid, Plavix, etc., or if he/she have some genetic predisposition  such as hemophilia. Bleeding into the spinal canal can cause compression of the spinal  cord with subsequent paralysis.  This would require an emergency surgery to  decompress and there are no guarantees that the patient would recover from the  paralysis.        5. Pneumothorax:  Puncturing of a lung is a possibility, every time a needle is introduced in  the area of the chest or upper back.  Pneumothorax refers to free air around the  collapsed lung(s), inside of the thoracic cavity (chest cavity).  Another two possible  complications related to a similar event would include: Hemothorax and Chylothorax.   These are variations of the Pneumothorax, where instead of air around the collapsed  lung(s), you may have blood or chyle, respectively.       6. Spinal headaches: They may occur with any procedures in the area of the spine.       7. Persistent CSF (Cerebro-Spinal Fluid) leakage: This is a rare problem, but may occur  with prolonged intrathecal or epidural catheters either due to the formation of a fistulous  track or a dural tear.       8. Nerve damage: By working so close to the spinal cord, there is always a possibility of  nerve damage, which could be as serious as a permanent spinal cord injury with  paralysis.       9. Death:  Although rare, severe deadly allergic reactions known as "Anaphylactic  reaction" can occur to any of the medications used.      10. Worsening of the symptoms:  We can always make thing worse.  What are the chances of something like this happening? Chances of any of this occuring are extremely low.  By statistics, you have more of a chance of getting killed in a motor vehicle accident: while driving to the hospital than any of the above occurring .  Nevertheless, you should be aware that they are possibilities.  In general, it is similar to taking a shower.  Everybody knows that you can slip, hit your head and get killed.  Does that mean that you should not shower again?  Nevertheless always keep in mind that statistics do not mean anything if you happen to be on the wrong side of them.  Even if a procedure has a 1 (one) in a 1,000,000 (million) chance of going wrong, it you happen to be that one..Also, keep in mind that by statistics,  you have more of a chance of having something go wrong when taking medications.  Who should not have this procedure? If you are on a blood thinning medication (e.g. Coumadin, Plavix, see list of "Blood Thinners"), or if you have an active infection going on, you should not have the procedure.  If you are taking any blood thinners, please inform your physician.  How should  I prepare for this procedure?  Do not eat or drink anything at least six hours prior to the procedure.  Bring a driver with you .  It cannot be a taxi.  Come accompanied by an adult that can drive you back, and that is strong enough to help you if your legs get weak or numb from the local anesthetic.  Take all of your medicines the morning of the procedure with just enough water to swallow them.  If you have diabetes, make sure that you are scheduled to have your procedure done first thing in the morning, whenever possible.  If you have diabetes, take only half of your insulin dose and notify our nurse that you have done so as soon as you arrive at the clinic.  If you are diabetic, but only take blood sugar pills (oral hypoglycemic), then do not take them on the morning of your procedure.  You may take them after you have had the procedure.  Do not take aspirin or any aspirin-containing medications, at least eleven (11) days prior to the procedure.  They may prolong bleeding.  Wear loose fitting clothing that may be easy to take off and that you would not mind if it got stained with Betadine or blood.  Do not wear any jewelry or perfume  Remove any nail coloring.  It will interfere with some of our monitoring equipment.  NOTE: Remember that this is not meant to be interpreted as a complete list of all possible complications.  Unforeseen problems may occur.  BLOOD THINNERS The following drugs contain aspirin or other products, which can cause increased bleeding during surgery and should not be taken for 2 weeks prior  to and 1 week after surgery.  If you should need take something for relief of minor pain, you may take acetaminophen which is found in Tylenol,m Datril, Anacin-3 and Panadol. It is not blood thinner. The products listed below are.  Do not take any of the products listed below in addition to any listed on your instruction sheet.  A.P.C or A.P.C with Codeine Codeine Phosphate Capsules #3 Ibuprofen Ridaura  ABC compound Congesprin Imuran rimadil  Advil Cope Indocin Robaxisal  Alka-Seltzer Effervescent Pain Reliever and Antacid Coricidin or Coricidin-D  Indomethacin Rufen  Alka-Seltzer plus Cold Medicine Cosprin Ketoprofen S-A-C Tablets  Anacin Analgesic Tablets or Capsules Coumadin Korlgesic Salflex  Anacin Extra Strength Analgesic tablets or capsules CP-2 Tablets Lanoril Salicylate  Anaprox Cuprimine Capsules Levenox Salocol  Anexsia-D Dalteparin Magan Salsalate  Anodynos Darvon compound Magnesium Salicylate Sine-off  Ansaid Dasin Capsules Magsal Sodium Salicylate  Anturane Depen Capsules Marnal Soma  APF Arthritis pain formula Dewitt's Pills Measurin Stanback  Argesic Dia-Gesic Meclofenamic Sulfinpyrazone  Arthritis Bayer Timed Release Aspirin Diclofenac Meclomen Sulindac  Arthritis pain formula Anacin Dicumarol Medipren Supac  Analgesic (Safety coated) Arthralgen Diffunasal Mefanamic Suprofen  Arthritis Strength Bufferin Dihydrocodeine Mepro Compound Suprol  Arthropan liquid Dopirydamole Methcarbomol with Aspirin Synalgos  ASA tablets/Enseals Disalcid Micrainin Tagament  Ascriptin Doan's Midol Talwin  Ascriptin A/D Dolene Mobidin Tanderil  Ascriptin Extra Strength Dolobid Moblgesic Ticlid  Ascriptin with Codeine Doloprin or Doloprin with Codeine Momentum Tolectin  Asperbuf Duoprin Mono-gesic Trendar  Aspergum Duradyne Motrin or Motrin IB Triminicin  Aspirin plain, buffered or enteric coated Durasal Myochrisine Trigesic  Aspirin Suppositories Easprin Nalfon Trillsate  Aspirin with  Codeine Ecotrin Regular or Extra Strength Naprosyn Uracel  Atromid-S Efficin Naproxen Ursinus  Auranofin Capsules Elmiron Neocylate Vanquish  Axotal Emagrin Norgesic Verin  Azathioprine Empirin or  Empirin with Codeine Normiflo Vitamin E  Azolid Emprazil Nuprin Voltaren  Bayer Aspirin plain, buffered or children's or timed BC Tablets or powders Encaprin Orgaran Warfarin Sodium  Buff-a-Comp Enoxaparin Orudis Zorpin  Buff-a-Comp with Codeine Equegesic Os-Cal-Gesic   Buffaprin Excedrin plain, buffered or Extra Strength Oxalid   Bufferin Arthritis Strength Feldene Oxphenbutazone   Bufferin plain or Extra Strength Feldene Capsules Oxycodone with Aspirin   Bufferin with Codeine Fenoprofen Fenoprofen Pabalate or Pabalate-SF   Buffets II Flogesic Panagesic   Buffinol plain or Extra Strength Florinal or Florinal with Codeine Panwarfarin   Buf-Tabs Flurbiprofen Penicillamine   Butalbital Compound Four-way cold tablets Penicillin   Butazolidin Fragmin Pepto-Bismol   Carbenicillin Geminisyn Percodan   Carna Arthritis Reliever Geopen Persantine   Carprofen Gold's salt Persistin   Chloramphenicol Goody's Phenylbutazone   Chloromycetin Haltrain Piroxlcam   Clmetidine heparin Plaquenil   Cllnoril Hyco-pap Ponstel   Clofibrate Hydroxy chloroquine Propoxyphen         Before stopping any of these medications, be sure to consult the physician who ordered them.  Some, such as Coumadin (Warfarin) are ordered to prevent or treat serious conditions such as "deep thrombosis", "pumonary embolisms", and other heart problems.  The amount of time that you may need off of the medication may also vary with the medication and the reason for which you were taking it.  If you are taking any of these medications, please make sure you notify your pain physician before you undergo any procedures.

## 2015-02-25 NOTE — Progress Notes (Signed)
Subjective:    Patient ID: Annette [name], female    DOB: Jun 16, 1960, 55 y.o.   MRN: [id number]  HPI   The patient is a 55 year old female who returns to pain management for further evaluation and treatment of pain involving the neck associated with headaches as well as pain occurring in the region of the shoulders entire back upper and lower extremity regions. The patient is with known degenerative changes cervical spine and prior surgery of the shoulders. Patient is with significant lower back lower extremity pain with degenerative changes of the lumbar region noted. The patient states that she has significant pain occurring in the neck shoulders and upper back region and headaches. We discussed patient's condition and will consider patient for greater occipital nerve blocks to be performed at time return appointment. The patient is tolerating Nucynta ER with oxycodone and Neurontin and Zanaflex without any undesirable side effects. We will continue the present treatment regimen and proceed with greater occipital nerve block and myoneural block injection to be performed at time return appointment in attempt to decrease severity of patient's symptoms, minimize progression of patient's symptoms, and avoid the need for more involved treatment. The patient was understanding and in agreement with suggested treatment plan.    Review of Systems     Objective:   Physical Exam  There was tenderness to palpation of paraspinal misreading the cervical region cervical facet region palpation which be produced moderate to moderately severe discomfort with severe tenderness to palpation over the splenius capitis and occipitalis musculature regions. Palpation of the splenius capitis and occipitalis musculature regions reproduced predominant portion of patient's pain with significant pain radiating to the occipital region and toward the retro-orbital region. Palpation over the region of the acromioclavicular and  glenohumeral joint regions were with mild to moderate discomfort. The patient appeared to be with slightly decreased grip strength and Tinel and Phalen's maneuver were without increased pain of significant degree. Palpation over the thoracic facet thoracic paraspinal must reason was with moderate tenderness to palpation and muscle spasm of the upper mid and lower thoracic regions. Palpation of the trapezius levator scapula and rhomboid musculature regions were with moderate tends to palpation of muscle spasms noted. Palpation over the lumbar paraspinal musculature region lumbar facet region was attends to palpation of moderate degree with lateral bending rotation extension and palpation of the lumbar facets reproducing moderate discomfort. There was moderate tenderness of the greater trochanteric region iliotibial band region as well as the PSIS and PII S regions. Straight leg raise was tolerates approximately 30 no increased pain with dorsiflexion noted. There was decreased EHL strength on the right compared to the left with negative Homans. DTRs were difficult to this patient had difficulty relaxing. Abdomen protuberant without excessive tends to palpation and no costovertebral tenderness noted.      Assessment & Plan:    Bilateral greater occipital neuralgia  Degenerative disc disease cervical spine  Degenerative disc disease lumbar spine lumbar L1-L2 3 degenerative changes with multilevel degenerative changes noted throughout the lumbar spine  Lumbar facet syndrome  Sacroiliac joint dysfunction  Status post total hip replacement      PLAN   Continue present medication Neurontin Zanaflex oxycodone and Nucynta ER. Caution pill is stronger Nucynta ER and can cause i respiratory depression stopping breathing excessive sedation confusion and other side effects as we reminded you at time of previous visit   Greater occipital nerve block to be performed at time of return appointment  F/U  PCP  Dr. Quay Burow for evaliation of  BP and general medical  condition  F/U surgical evaluation. May consider pending follow-up evaluations  F/U neurological evaluation. May consider pending follow-up evaluations  Ask the staff when you will be approved for radiofrequency rhizolysis lumbar facets  May consider radiofrequency rhizolysis or intraspinal procedures pending response to present treatment and F/U evaluation   Patient to call Pain Management Center should patient have concerns prior to scheduled return appointment.Pain Management

## 2015-02-25 NOTE — Progress Notes (Signed)
Safety precautions to be maintained throughout the outpatient stay will include: orient to surroundings, keep bed in low position, maintain call bell within reach at all times, provide assistance with transfer out of bed and ambulation.  

## 2015-03-24 ENCOUNTER — Encounter: Payer: Self-pay | Admitting: Pain Medicine

## 2015-03-24 ENCOUNTER — Ambulatory Visit: Payer: Medicare Other | Attending: Pain Medicine | Admitting: Pain Medicine

## 2015-03-24 VITALS — BP 126/74 | HR 71 | Temp 97.8°F | Resp 14 | Ht 65.0 in | Wt 287.0 lb

## 2015-03-24 DIAGNOSIS — G5700 Lesion of sciatic nerve, unspecified lower limb: Secondary | ICD-10-CM

## 2015-03-24 DIAGNOSIS — E1340 Other specified diabetes mellitus with diabetic neuropathy, unspecified: Secondary | ICD-10-CM

## 2015-03-24 DIAGNOSIS — M5136 Other intervertebral disc degeneration, lumbar region: Secondary | ICD-10-CM

## 2015-03-24 DIAGNOSIS — G5701 Lesion of sciatic nerve, right lower limb: Secondary | ICD-10-CM

## 2015-03-24 DIAGNOSIS — M542 Cervicalgia: Secondary | ICD-10-CM | POA: Diagnosis present

## 2015-03-24 DIAGNOSIS — M5030 Other cervical disc degeneration, unspecified cervical region: Secondary | ICD-10-CM

## 2015-03-24 DIAGNOSIS — M5481 Occipital neuralgia: Secondary | ICD-10-CM

## 2015-03-24 DIAGNOSIS — R51 Headache: Secondary | ICD-10-CM | POA: Insufficient documentation

## 2015-03-24 DIAGNOSIS — M47816 Spondylosis without myelopathy or radiculopathy, lumbar region: Secondary | ICD-10-CM

## 2015-03-24 MED ORDER — BUPIVACAINE HCL (PF) 0.25 % IJ SOLN
30.0000 mL | Freq: Once | INTRAMUSCULAR | Status: DC
Start: 2015-03-24 — End: 2015-08-28

## 2015-03-24 MED ORDER — MIDAZOLAM HCL 5 MG/5ML IJ SOLN: 5.0000 mg | Freq: Once | INTRAMUSCULAR | Status: DC

## 2015-03-24 MED ORDER — ORPHENADRINE CITRATE 30 MG/ML IJ SOLN
INTRAMUSCULAR | Status: AC
  Administered 2015-03-24: 11:00:00
  Filled 2015-03-24: qty 2

## 2015-03-24 MED ORDER — KETOROLAC TROMETHAMINE 60 MG/2ML IM SOLN
INTRAMUSCULAR | Status: AC
  Administered 2015-03-24: 11:00:00
  Filled 2015-03-24: qty 2

## 2015-03-24 MED ORDER — BUPIVACAINE HCL (PF) 0.25 % IJ SOLN
INTRAMUSCULAR | Status: AC
  Filled 2015-03-24: qty 30

## 2015-03-24 MED ORDER — SODIUM CHLORIDE 0.9% FLUSH: 20.0000 mL | Freq: Once | INTRAVENOUS | Status: DC

## 2015-03-24 MED ORDER — SODIUM CHLORIDE 0.9 % IJ SOLN
INTRAMUSCULAR | Status: AC
  Administered 2015-03-24: 11:00:00
  Filled 2015-03-24: qty 20

## 2015-03-24 MED ORDER — TAPENTADOL HCL ER 150 MG PO TB12: ORAL_TABLET | ORAL | Status: DC

## 2015-03-24 MED ORDER — OXYCODONE HCL 10 MG PO TABS: ORAL_TABLET | ORAL | Status: DC

## 2015-03-24 MED ORDER — FENTANYL CITRATE (PF) 100 MCG/2ML IJ SOLN
INTRAMUSCULAR | Status: AC
  Administered 2015-03-24: 50 ug via INTRAVENOUS
  Filled 2015-03-24: qty 2

## 2015-03-24 MED ORDER — ORPHENADRINE CITRATE 30 MG/ML IJ SOLN: 60.0000 mg | Freq: Once | INTRAMUSCULAR | Status: DC

## 2015-03-24 MED ORDER — LACTATED RINGERS IV SOLN: 1000.0000 mL | INTRAVENOUS | Status: DC

## 2015-03-24 MED ORDER — BUPIVACAINE HCL (PF) 0.5 % IJ SOLN
INTRAMUSCULAR | Status: AC
  Administered 2015-03-24: 11:00:00
  Filled 2015-03-24: qty 30

## 2015-03-24 MED ORDER — FENTANYL CITRATE (PF) 100 MCG/2ML IJ SOLN: 100.0000 ug | Freq: Once | INTRAMUSCULAR | Status: DC

## 2015-03-24 MED ORDER — MIDAZOLAM HCL 5 MG/5ML IJ SOLN
INTRAMUSCULAR | Status: AC
  Administered 2015-03-24: 3 mg via INTRAVENOUS
  Filled 2015-03-24: qty 5

## 2015-03-24 NOTE — Progress Notes (Signed)
   Subjective:    Patient ID: Annette [name], female    DOB: 1960/07/03, 55 y.o.   MRN: [id number]  HPI  NOTE: The patient is a 55 y.o.-year-old female who returns to the Pain Management Center for further evaluation and treatment of pain consisting of pain involving the region of the neck and headache.  Patient is with prior pain that radiates from the neck to the back of the head producing severe headaches. The patient is with known degenerative disc disease of the cervical spine and headaches have been felt to be with significant component of headaches been due to greater occipital neuralgia   The risks, benefits, and expectations of the procedure have been discussed and explained to patient, who is understanding and wishes to proceed with interventional treatment as discussed and as explained to patient.  Will proceed with greater occipital nerve blocks with myoneural block injections at this time as discussed and as explained to patient.  All are understanding and in agreement with suggested treatment plan.    PROCEDURE:  Greater occipital nerve block on the left side with IV Versed, IV Fentanyl, conscious sedation, EKG, blood pressure, pulse, pulse oximetry monitoring.  Procedure performed with patient in prone position.  Greater occipital nerve block on the left side.   With patient in prone position, Betadine prep of proposed entry site accomplished.  Following identification of the nuchal ridge, 22 -gauge needle was inserted at the level of the nuchal ridge medial to the occipital artery.  Following negative aspiration, 4cc 0.25% bupivacaine with Toradol was injected for left greater occipital nerve block.  Needle was removed.  Patient tolerated injection well.   Greater occipital nerve block on the rightt side. The greater occipital nerve block on the right side was performed exactly as the left greater occipital nerve block was performed and utilizing the same technique.  Myoneural block  injections of the cervical region Following Betadine prep of proposed entry site a 22-gauge needle was inserted in the cervical musculature region and following negative aspiration 2 cc of 0.25% bupivacaine with Norflex was injected for myoneural block injection of the cervical region 4  The patient tolerated procedure well   A total of 60 mg of  Toradol was utilized for the entire procedure.  PLAN:    1. Medications: Will continue presently prescribed medications Neurontin Zanaflex oxycodone and Nucynta ER at this time. 2. Patient to follow up with primary care physician Dr. Quay Burow for evaluation of blood pressure and general medical condition status post procedure performed on today's visit. 3. Neurological evaluation for further assessment of headaches for further studies as discussed. 4. Surgical evaluation as discussed.  5. Patient may be candidate for Botox injections, radiofrequency procedures, as well as implantation type procedures pending response to treatment rendered on today's visit and pending follow-up evaluation. 6. Patient has been advised to adhere to proper body mechanics and to avoid activities which appear to aggravate condition.cations:  Will continue presently prescribed medications at this time. 7. The patient is understanding and in agreement with the suggested treatment plan.   Review of Systems     Objective:   Physical Exam        Assessment & Plan:

## 2015-03-24 NOTE — Patient Instructions (Addendum)
PLAN   Continue present medication Neurontin Zanaflex oxycodone and Nucynta ER. Caution pill is stronger Nucynta ER as we previously's and can cause  respiratory depression stopping breathing excessive sedation confusion and other side effects as we reminded you at time of previous visit  F/U PCP Dr. Quay Burow for evaliation of  BP and general medical  condition  F/U surgical evaluation. May consider pending follow-up evaluations  F/U neurological evaluation. May consider pending follow-up evaluations  Ask the staff when you will be approved for radiofrequency rhizolysis lumbar facets  May consider radiofrequency rhizolysis or intraspinal procedures pending response to present treatment and F/U evaluation   Patient to call [facility] should patient have concerns prior to scheduled return appointmentGENERAL RISKS AND COMPLICATIONS  What are the risk, side effects and possible complications? Generally speaking, most procedures are safe.  However, with any procedure there are risks, side effects, and the possibility of complications.  The risks and complications are dependent upon the sites that are lesioned, or the type of nerve block to be performed.  The closer the procedure is to the spine, the more serious the risks are.  Great care is taken when placing the radio frequency needles, block needles or lesioning probes, but sometimes complications can occur. 1. Infection: Any time there is an injection through the skin, there is a risk of infection.  This is why sterile conditions are used for these blocks.  There are four possible types of infection. 1. Localized skin infection. 2. Central Nervous System Infection-This can be in the form of Meningitis, which can be deadly. 3. Epidural Infections-This can be in the form of an epidural abscess, which can cause pressure inside of the spine, causing compression of the spinal cord with subsequent paralysis. This would require an emergency  surgery to decompress, and there are no guarantees that the patient would recover from the paralysis. 4. Discitis-This is an infection of the intervertebral discs.  It occurs in about 1% of discography procedures.  It is difficult to treat and it may lead to surgery.        2. Pain: the needles have to go through skin and soft tissues, will cause soreness.       3. Damage to internal structures:  The nerves to be lesioned may be near blood vessels or    other nerves which can be potentially damaged.       4. Bleeding: Bleeding is more common if the patient is taking blood thinners such as  aspirin, Coumadin, Ticiid, Plavix, etc., or if he/she have some genetic predisposition  such as hemophilia. Bleeding into the spinal canal can cause compression of the spinal  cord with subsequent paralysis.  This would require an emergency surgery to  decompress and there are no guarantees that the patient would recover from the  paralysis.       5. Pneumothorax:  Puncturing of a lung is a possibility, every time a needle is introduced in  the area of the chest or upper back.  Pneumothorax refers to free air around the  collapsed lung(s), inside of the thoracic cavity (chest cavity).  Another two possible  complications related to a similar event would include: Hemothorax and Chylothorax.   These are variations of the Pneumothorax, where instead of air around the collapsed  lung(s), you may have blood or chyle, respectively.       6. Spinal headaches: They may occur with any procedures in the area of the spine.  7. Persistent CSF (Cerebro-Spinal Fluid) leakage: This is a rare problem, but may occur  with prolonged intrathecal or epidural catheters either due to the formation of a fistulous  track or a dural tear.       8. Nerve damage: By working so close to the spinal cord, there is always a possibility of  nerve damage, which could be as serious as a permanent spinal cord injury with  paralysis.       9. Death:   Although rare, severe deadly allergic reactions known as "Anaphylactic  reaction" can occur to any of the medications used.      10. Worsening of the symptoms:  We can always make thing worse.  What are the chances of something like this happening? Chances of any of this occuring are extremely low.  By statistics, you have more of a chance of getting killed in a motor vehicle accident: while driving to the hospital than any of the above occurring .  Nevertheless, you should be aware that they are possibilities.  In general, it is similar to taking a shower.  Everybody knows that you can slip, hit your head and get killed.  Does that mean that you should not shower again?  Nevertheless always keep in mind that statistics do not mean anything if you happen to be on the wrong side of them.  Even if a procedure has a 1 (one) in a 1,000,000 (million) chance of going wrong, it you happen to be that one..Also, keep in mind that by statistics, you have more of a chance of having something go wrong when taking medications.  Who should not have this procedure? If you are on a blood thinning medication (e.g. Coumadin, Plavix, see list of "Blood Thinners"), or if you have an active infection going on, you should not have the procedure.  If you are taking any blood thinners, please inform your physician.  How should I prepare for this procedure?  Do not eat or drink anything at least six hours prior to the procedure.  Bring a driver with you .  It cannot be a taxi.  Come accompanied by an adult that can drive you back, and that is strong enough to help you if your legs get weak or numb from the local anesthetic.  Take all of your medicines the morning of the procedure with just enough water to swallow them.  If you have diabetes, make sure that you are scheduled to have your procedure done first thing in the morning, whenever possible.  If you have diabetes, take only half of your insulin dose and notify our  nurse that you have done so as soon as you arrive at the clinic.  If you are diabetic, but only take blood sugar pills (oral hypoglycemic), then do not take them on the morning of your procedure.  You may take them after you have had the procedure.  Do not take aspirin or any aspirin-containing medications, at least eleven (11) days prior to the procedure.  They may prolong bleeding.  Wear loose fitting clothing that may be easy to take off and that you would not mind if it got stained with Betadine or blood.  Do not wear any jewelry or perfume  Remove any nail coloring.  It will interfere with some of our monitoring equipment.  NOTE: Remember that this is not meant to be interpreted as a complete list of all possible complications.  Unforeseen problems may occur.  BLOOD THINNERS  The following drugs contain aspirin or other products, which can cause increased bleeding during surgery and should not be taken for 2 weeks prior to and 1 week after surgery.  If you should need take something for relief of minor pain, you may take acetaminophen which is found in Tylenol,m Datril, Anacin-3 and Panadol. It is not blood thinner. The products listed below are.  Do not take any of the products listed below in addition to any listed on your instruction sheet.  A.P.C or A.P.C with Codeine Codeine Phosphate Capsules #3 Ibuprofen Ridaura  ABC compound Congesprin Imuran rimadil  Advil Cope Indocin Robaxisal  Alka-Seltzer Effervescent Pain Reliever and Antacid Coricidin or Coricidin-D  Indomethacin Rufen  Alka-Seltzer plus Cold Medicine Cosprin Ketoprofen S-A-C Tablets  Anacin Analgesic Tablets or Capsules Coumadin Korlgesic Salflex  Anacin Extra Strength Analgesic tablets or capsules CP-2 Tablets Lanoril Salicylate  Anaprox Cuprimine Capsules Levenox Salocol  Anexsia-D Dalteparin Magan Salsalate  Anodynos Darvon compound Magnesium Salicylate Sine-off  Ansaid Dasin Capsules Magsal Sodium Salicylate   Anturane Depen Capsules Marnal Soma  APF Arthritis pain formula Dewitt's Pills Measurin Stanback  Argesic Dia-Gesic Meclofenamic Sulfinpyrazone  Arthritis Bayer Timed Release Aspirin Diclofenac Meclomen Sulindac  Arthritis pain formula Anacin Dicumarol Medipren Supac  Analgesic (Safety coated) Arthralgen Diffunasal Mefanamic Suprofen  Arthritis Strength Bufferin Dihydrocodeine Mepro Compound Suprol  Arthropan liquid Dopirydamole Methcarbomol with Aspirin Synalgos  ASA tablets/Enseals Disalcid Micrainin Tagament  Ascriptin Doan's Midol Talwin  Ascriptin A/D Dolene Mobidin Tanderil  Ascriptin Extra Strength Dolobid Moblgesic Ticlid  Ascriptin with Codeine Doloprin or Doloprin with Codeine Momentum Tolectin  Asperbuf Duoprin Mono-gesic Trendar  Aspergum Duradyne Motrin or Motrin IB Triminicin  Aspirin plain, buffered or enteric coated Durasal Myochrisine Trigesic  Aspirin Suppositories Easprin Nalfon Trillsate  Aspirin with Codeine Ecotrin Regular or Extra Strength Naprosyn Uracel  Atromid-S Efficin Naproxen Ursinus  Auranofin Capsules Elmiron Neocylate Vanquish  Axotal Emagrin Norgesic Verin  Azathioprine Empirin or Empirin with Codeine Normiflo Vitamin E  Azolid Emprazil Nuprin Voltaren  Bayer Aspirin plain, buffered or children's or timed BC Tablets or powders Encaprin Orgaran Warfarin Sodium  Buff-a-Comp Enoxaparin Orudis Zorpin  Buff-a-Comp with Codeine Equegesic Os-Cal-Gesic   Buffaprin Excedrin plain, buffered or Extra Strength Oxalid   Bufferin Arthritis Strength Feldene Oxphenbutazone   Bufferin plain or Extra Strength Feldene Capsules Oxycodone with Aspirin   Bufferin with Codeine Fenoprofen Fenoprofen Pabalate or Pabalate-SF   Buffets II Flogesic Panagesic   Buffinol plain or Extra Strength Florinal or Florinal with Codeine Panwarfarin   Buf-Tabs Flurbiprofen Penicillamine   Butalbital Compound Four-way cold tablets Penicillin   Butazolidin Fragmin Pepto-Bismol    Carbenicillin Geminisyn Percodan   Carna Arthritis Reliever Geopen Persantine   Carprofen Gold's salt Persistin   Chloramphenicol Goody's Phenylbutazone   Chloromycetin Haltrain Piroxlcam   Clmetidine heparin Plaquenil   Cllnoril Hyco-pap Ponstel   Clofibrate Hydroxy chloroquine Propoxyphen         Before stopping any of these medications, be sure to consult the physician who ordered them.  Some, such as Coumadin (Warfarin) are ordered to prevent or treat serious conditions such as "deep thrombosis", "pumonary embolisms", and other heart problems.  The amount of time that you may need off of the medication may also vary with the medication and the reason for which you were taking it.  If you are taking any of these medications, please make sure you notify your pain physician before you undergo any procedures.

## 2015-03-24 NOTE — Progress Notes (Signed)
Safety precautions to be maintained throughout the outpatient stay will include: orient to surroundings, keep bed in low position, maintain call bell within reach at all times, provide assistance with transfer out of bed and ambulation.  

## 2015-03-25 ENCOUNTER — Telehealth: Payer: Self-pay | Admitting: *Deleted

## 2015-03-25 NOTE — Telephone Encounter (Signed)
Left voicemail with patient to call our office if there are any questions or concerns re; procedure on yesterday.

## 2015-04-05 ENCOUNTER — Encounter: Payer: Medicare Other | Admitting: Pain Medicine

## 2015-04-08 ENCOUNTER — Telehealth: Payer: Self-pay | Admitting: Pain Medicine

## 2015-04-08 NOTE — Telephone Encounter (Signed)
cancel

## 2015-04-13 ENCOUNTER — Telehealth: Payer: Self-pay

## 2015-04-13 NOTE — Telephone Encounter (Signed)
Spoke with Dr. Primus Bravo, patient may come in a week earlier. Please call to reschedule her.

## 2015-04-13 NOTE — Telephone Encounter (Signed)
Nurses Lelon Huh and Juliann Pulse Please discuss patient's concern with me and let me know if we need to make any changes in patient's appointment or any other changes

## 2015-04-13 NOTE — Telephone Encounter (Signed)
Pt wants to know why does she have to wait to come in on 3/30 when she has been coming in earlier. In pts chart it says her meds are due 3/30 SA

## 2015-04-20 NOTE — Telephone Encounter (Signed)
R/S patient for 04-28-15 at her request.

## 2015-04-28 ENCOUNTER — Ambulatory Visit: Payer: Medicare Other | Attending: Pain Medicine | Admitting: Pain Medicine

## 2015-04-28 ENCOUNTER — Encounter: Payer: Self-pay | Admitting: Pain Medicine

## 2015-04-28 VITALS — BP 125/70 | HR 63 | Temp 97.7°F | Resp 16 | Ht 65.0 in | Wt 285.0 lb

## 2015-04-28 DIAGNOSIS — Z96649 Presence of unspecified artificial hip joint: Secondary | ICD-10-CM | POA: Diagnosis not present

## 2015-04-28 DIAGNOSIS — M533 Sacrococcygeal disorders, not elsewhere classified: Secondary | ICD-10-CM | POA: Diagnosis not present

## 2015-04-28 DIAGNOSIS — M47816 Spondylosis without myelopathy or radiculopathy, lumbar region: Secondary | ICD-10-CM

## 2015-04-28 DIAGNOSIS — M5481 Occipital neuralgia: Secondary | ICD-10-CM

## 2015-04-28 DIAGNOSIS — M542 Cervicalgia: Secondary | ICD-10-CM | POA: Diagnosis present

## 2015-04-28 DIAGNOSIS — M5030 Other cervical disc degeneration, unspecified cervical region: Secondary | ICD-10-CM | POA: Diagnosis not present

## 2015-04-28 DIAGNOSIS — R51 Headache: Secondary | ICD-10-CM | POA: Diagnosis present

## 2015-04-28 DIAGNOSIS — G5701 Lesion of sciatic nerve, right lower limb: Secondary | ICD-10-CM

## 2015-04-28 DIAGNOSIS — E1340 Other specified diabetes mellitus with diabetic neuropathy, unspecified: Secondary | ICD-10-CM

## 2015-04-28 DIAGNOSIS — M5136 Other intervertebral disc degeneration, lumbar region: Secondary | ICD-10-CM | POA: Insufficient documentation

## 2015-04-28 DIAGNOSIS — G5700 Lesion of sciatic nerve, unspecified lower limb: Secondary | ICD-10-CM

## 2015-04-28 MED ORDER — GABAPENTIN 600 MG PO TABS: ORAL_TABLET | ORAL | Status: DC

## 2015-04-28 MED ORDER — TAPENTADOL HCL ER 150 MG PO TB12: ORAL_TABLET | ORAL | Status: DC

## 2015-04-28 MED ORDER — OXYCODONE HCL 10 MG PO TABS: ORAL_TABLET | ORAL | Status: DC

## 2015-04-28 MED ORDER — TIZANIDINE HCL 2 MG PO CAPS: ORAL_CAPSULE | ORAL | Status: DC

## 2015-04-28 NOTE — Progress Notes (Signed)
Subjective:    Patient ID: Annette [name], female    DOB: 1960/05/19, 55 y.o.   MRN: [id number]  HPI  The patient is a 55 year old female who returns to pain management for further evaluation and treatment of pain involving the neck associated with headaches as well as pain occurring in the upper back the region between the neck and shoulders mid back lower back and lower extremity regions. The patient stated that she is amazed at the amount of relief of pain that she has had following greater occipital nerve blocks. The patient states that her headaches decreased significantly. The patient states that she continues under the care of Dr.Su who prescribed Xanax for her. We discussed patient's condition and will continue medications as prescribed at this time. The patient has been compliant without deviation from treatment regimen. We will consider additional interventional treatment as well should headaches returned to significant degree are should lower back lower extremity pain and pain due to bursitis become particularly bothersome. We will continue medications as prescribed at this time and patient will call should she have any return of significant pain. All agreed with suggested treatment plan      Review of Systems     Objective:   Physical Exam   There was mild tenderness of the splenius capitis and occipitalis musculature region. Palpation over the cervical facet cervical paraspinal musculature region was with mild muscle spasm and mild discomfort. There appeared to be unremarkable Spurling's maneuver. Palpation of the acromioclavicular and glenohumeral joint regions reproduce moderate discomfort. The patient appeared to be with slightly decreased grip strength and Tinel and Phalen's maneuver were without increase of pain of significant degree. Palpation over the thoracic region thoracic facet region was a tennis to palpation of the mid thoracic region with moderate muscle spasms on the  left. There was tenderness over the region of the lumbar paraspinal musculature region lumbar facet region with lateral bending rotation extension and palpation of the lumbar facets reproducing mild to moderate discomfort. Palpation over the PSIS and PII S region reproduced mild to moderate discomfort. There was moderate tenderness of the greater trochanteric region and iliotibial band region. Palpation of the knees were attends to palpation with crepitus of the knees with negative anterior and posterior drawer signs without ballottement of the patella. EHL strength was decreased on the right compared to the left. There was no sensory deficit or dermatomal distribution detected. Straight leg raising was tolerates approximately 30 without a definite increased pain with dorsiflexion noted. There was negative clonus negative Homans. Abdomen was nontender with no costovertebral tenderness noted     Assessment & Plan:     Bilateral greater occipital neuralgia  Degenerative disc disease cervical spine  Degenerative disc disease lumbar spine lumbar L1-L2 3 degenerative changes with multilevel degenerative changes noted throughout the lumbar spine  Lumbar facet syndrome  Sacroiliac joint dysfunction  Status post total hip replacement       PLAN   Continue present medication Neurontin Zanaflex oxycodone and Nucynta ER. Caution pill is stronger Nucynta ER and can cause i respiratory depression stopping breathing excessive sedation confusion and other side effects as we reminded you at time of previous visit   F/U PCP Dr. Quay Burow for evaliation of  BP and general medical  condition. Blood pressure was very good reading on the second reading 125/70 and patient remained totally asymptomatic. Patient will continue to monitor blood pressure and will follow-up with Dr. Quay Burow as discussed  F/U surgical evaluation. May  consider pending follow-up evaluations  F/U neurological evaluation. May consider  pending follow-up evaluations  F/U psych evaluation  Ask the staff when you will be approved for radiofrequency rhizolysis lumbar facets  May consider radiofrequency rhizolysis or intraspinal procedures pending response to present treatment and F/U evaluation   Patient to call [facility] should patient have concerns prior to scheduled return appointment.

## 2015-04-28 NOTE — Patient Instructions (Addendum)
PLAN   Continue present medication Neurontin Zanaflex oxycodone and Nucynta ER. Caution pill is stronger Nucynta ER and can cause i respiratory depression stopping breathing excessive sedation confusion and other side effects as we reminded you at time of previous visit   F/U PCP Dr. Quay Burow for evaliation of  BP and general medical  condition. Blood pressure was very good reading on the second reading 125/70 and patient remained totally asymptomatic. Patient will continue to monitor blood pressure and will follow-up with Dr. Quay Burow as discussed  F/U surgical evaluation. May consider pending follow-up evaluations  F/U neurological evaluation. May consider pending follow-up evaluations  F/U psych evaluation  Ask the staff when you will be approved for radiofrequency rhizolysis lumbar facets  May consider radiofrequency rhizolysis or intraspinal procedures pending response to present treatment and F/U evaluation   Patient to call [facility] should patient have concerns prior to scheduled return appointment.

## 2015-04-28 NOTE — Progress Notes (Signed)
Patient here for medication management Safety precautions to be maintained throughout the outpatient stay will include: orient to surroundings, keep bed in low position, maintain call bell within reach at all times, provide assistance with transfer out of bed and ambulation.  

## 2015-05-03 ENCOUNTER — Telehealth: Payer: Self-pay | Admitting: Pain Medicine

## 2015-05-03 NOTE — Telephone Encounter (Signed)
Dena and Nurses, I need clarification of message regarding increase of medications and nurses submitting information for increase of medications. Please discussed with me

## 2015-05-03 NOTE — Telephone Encounter (Signed)
Did nurses send off request about increase in her meds ?

## 2015-05-03 NOTE — Telephone Encounter (Signed)
Dr. Kasandra Knudsen was supposed to fax a form to Dr. Primus Bravo, asking if ok to increase Alprazolam. Ms. Drysdale informed we have never received that form. She will call Dr. Lilla Shook office to have them fax it again.

## 2015-05-03 NOTE — Telephone Encounter (Signed)
Annette [name] and Nurses Please inform Dr.Su and patient that it is alright with me if Dr. Kasandra Knudsen wishes to increase patient's alprazolam (Xanax)

## 2015-05-03 NOTE — Telephone Encounter (Signed)
Dr. Primus Bravo, do you know what Ms. Argote is referring to?

## 2015-05-04 NOTE — Telephone Encounter (Signed)
Spoke with Massie Maroon and she is going to call Dr Kasandra Knudsen and given them our fax so they can send for this approval.

## 2015-05-05 ENCOUNTER — Encounter: Payer: Self-pay | Admitting: *Deleted

## 2015-05-05 NOTE — Telephone Encounter (Signed)
Letter from Dr. Kasandra Knudsen received, Dr. Primus Bravo responded.

## 2015-05-06 ENCOUNTER — Other Ambulatory Visit: Payer: Self-pay | Admitting: Pain Medicine

## 2015-05-06 ENCOUNTER — Encounter: Payer: Medicare Other | Admitting: Pain Medicine

## 2015-05-07 ENCOUNTER — Telehealth: Payer: Self-pay

## 2015-05-07 NOTE — Telephone Encounter (Signed)
Pt left VM wants a nurse to call her back she was wondering did anyone fax the form that needed to be faxed concerning her meds

## 2015-05-10 ENCOUNTER — Telehealth: Payer: Self-pay

## 2015-05-10 NOTE — Telephone Encounter (Signed)
Patient notified that the letter was sent to Dr. Kasandra Knudsen, ok to increase Alprazolam to 4 mg.

## 2015-05-10 NOTE — Telephone Encounter (Signed)
Dr. Collie Siad faxed over a letter to Dr. Primus Bravo Dr. Collie Siad wanted to know could he give pt 4mg  instead of 3mg  of alprazolam. Pt said he faxed this information over last Tuesday

## 2015-05-12 ENCOUNTER — Telehealth: Payer: Self-pay

## 2015-05-12 NOTE — Telephone Encounter (Signed)
Patient notified via v/m.

## 2015-05-12 NOTE — Telephone Encounter (Signed)
Patient wants to know if you received fax from Dr. Kasandra Knudsen.Marland KitchenPlease let her know

## 2015-05-12 NOTE — Telephone Encounter (Signed)
Blanch Media and Juliann Pulse and Nurses Yes I received  fax for from Dr.Su

## 2015-05-26 ENCOUNTER — Ambulatory Visit: Payer: Medicare Other | Attending: Pain Medicine | Admitting: Pain Medicine

## 2015-05-26 ENCOUNTER — Encounter: Payer: Self-pay | Admitting: Pain Medicine

## 2015-05-26 VITALS — BP 101/76 | HR 85 | Temp 98.3°F | Resp 16 | Ht 66.0 in | Wt 286.0 lb

## 2015-05-26 DIAGNOSIS — M47816 Spondylosis without myelopathy or radiculopathy, lumbar region: Secondary | ICD-10-CM

## 2015-05-26 DIAGNOSIS — M533 Sacrococcygeal disorders, not elsewhere classified: Secondary | ICD-10-CM | POA: Diagnosis not present

## 2015-05-26 DIAGNOSIS — E1340 Other specified diabetes mellitus with diabetic neuropathy, unspecified: Secondary | ICD-10-CM

## 2015-05-26 DIAGNOSIS — G5701 Lesion of sciatic nerve, right lower limb: Secondary | ICD-10-CM

## 2015-05-26 DIAGNOSIS — M5136 Other intervertebral disc degeneration, lumbar region: Secondary | ICD-10-CM | POA: Insufficient documentation

## 2015-05-26 DIAGNOSIS — M19011 Primary osteoarthritis, right shoulder: Secondary | ICD-10-CM | POA: Diagnosis not present

## 2015-05-26 DIAGNOSIS — M62838 Other muscle spasm: Secondary | ICD-10-CM | POA: Insufficient documentation

## 2015-05-26 DIAGNOSIS — R51 Headache: Secondary | ICD-10-CM | POA: Diagnosis present

## 2015-05-26 DIAGNOSIS — M542 Cervicalgia: Secondary | ICD-10-CM | POA: Diagnosis present

## 2015-05-26 DIAGNOSIS — M5481 Occipital neuralgia: Secondary | ICD-10-CM | POA: Diagnosis not present

## 2015-05-26 DIAGNOSIS — M19012 Primary osteoarthritis, left shoulder: Secondary | ICD-10-CM | POA: Insufficient documentation

## 2015-05-26 DIAGNOSIS — G588 Other specified mononeuropathies: Secondary | ICD-10-CM | POA: Insufficient documentation

## 2015-05-26 DIAGNOSIS — Z96649 Presence of unspecified artificial hip joint: Secondary | ICD-10-CM | POA: Diagnosis not present

## 2015-05-26 DIAGNOSIS — M546 Pain in thoracic spine: Secondary | ICD-10-CM | POA: Diagnosis present

## 2015-05-26 DIAGNOSIS — M5030 Other cervical disc degeneration, unspecified cervical region: Secondary | ICD-10-CM

## 2015-05-26 DIAGNOSIS — G5700 Lesion of sciatic nerve, unspecified lower limb: Secondary | ICD-10-CM

## 2015-05-26 MED ORDER — OXYCODONE HCL 10 MG PO TABS: ORAL_TABLET | ORAL | Status: DC

## 2015-05-26 MED ORDER — TAPENTADOL HCL ER 150 MG PO TB12: ORAL_TABLET | ORAL | Status: DC

## 2015-05-26 NOTE — Patient Instructions (Addendum)
PLAN   Continue present medication Neurontin Zanaflex oxycodone and Nucynta ER. Caution pill is stronger Nucynta ER and can cause respiratory depression, stopping of breathing, excessive sedation, confusion, and other side effects as we reminded you at time of previous visit   Greater occipital nerve block to be performed at time of return appointment  F/U PCP Dr. Quay Burow for evaliation of  BP and general medical  condition.   F/U surgical evaluation. May consider pending follow-up evaluations as discussed  F/U neurological evaluation. May consider PNCV/EMG studies and other studies pending follow-up evaluations  F/U psych evaluation  Ask the staff when you will be approved for radiofrequency rhizolysis lumbar facets as previously mentioned  May consider radiofrequency rhizolysis or intraspinal procedures pending response to present treatment and F/U evaluation   Patient to call Pain Management Center should patient have concerns prior to scheduled return appointment. Occipital Nerve Block Patient Information  Description: The occipital nerves originate in the cervical (neck) spinal cord and travel upward through muscle and tissue to supply sensation to the back of the head and top of the scalp.  In addition, the nerves control some of the muscles of the scalp.  Occipital neuralgia is an irritation of these nerves which can cause headaches, numbness of the scalp, and neck discomfort.     The occipital nerve block will interrupt nerve transmission through these nerves and can relieve pain and spasm.  The block consists of insertion of a small needle under the skin in the back of the head to deposit local anesthetic (numbing medicine) and/or steroids around the nerve.  The entire block usually lasts less than 5 minutes.  Conditions which may be treated by occipital blocks:   Muscular pain and spasm of the scalp  Nerve irritation, back of the head  Headaches  Upper neck  pain  Preparation for the injection:  1. Do not eat any solid food or dairy products within 8 hours of your appointment. 2. You may drink clear liquids up to 3 hours before appointment.  Clear liquids include water, black coffee, juice or soda.  No milk or cream please. 3. You may take your regular medication, including pain medications, with a sip of water before you appointment.  Diabetics should hold regular insulin (if taken separately) and take 1/2 normal NPH dose the morning of the procedure.  Carry some sugar containing items with you to your appointment. 4. A driver must accompany you and be prepared to drive you home after your procedure. 5. Bring all your current medications with you. 6. An IV may be inserted and sedation may be given at the discretion of the physician. 7. A blood pressure cuff, EKG, and other monitors will often be applied during the procedure.  Some patients may need to have extra oxygen administered for a short period. 8. You will be asked to provide medical information, including your allergies and medications, prior to the procedure.  We must know immediately if you are taking blood thinners (like Coumadin/Warfarin) or if you are allergic to IV iodine contrast (dye).  We must know if you could possible be pregnant.  9. Do not wear a high collared shirt or turtleneck.  Tie long hair up in the back if possible.  Possible side-effects:   Bleeding from needle site  Infection (rare, may require surgery)  Nerve injury (rare)  Hair on back of neck can be tinged with iodine scrub (this will wash out)  Light-headedness (temporary)  Pain at injection site (  several days)  Decreased blood pressure (rare, temporary)  Seizure (very rare)  Call if you experience:   Hives or difficulty breathing ( go to the emergency room)  Inflammation or drainage at the injection site(s)  Please note:  Although the local anesthetic injected can often make your painful  muscles or headache feel good for several hours after the injection, the pain may return.  It takes 3-7 days for steroids to work.  You may not notice any pain relief for at least one week.  If effective, we will often do a series of injections spaced 3-6 weeks apart to maximally decrease your pain.  If you have any questions, please call [phone] [facility]

## 2015-05-26 NOTE — Progress Notes (Signed)
Subjective:    Patient ID: Annette [name], female    DOB: Dec 26, 1960, 55 y.o.   MRN: [id number]  HPI  The patient is a 55 year old female who returns to pain management for further evaluation and treatment of pain involving the region of the neck headaches into back upper and lower extremity regions. The patient states that she has significant pain occurring in the region between the neck and shoulder especially on the left side. The patient states that she has significant muscle spasm on the left side of the neck. The patient denies any trauma change in events of daily living the call significant change in symptomatology. We discussed patient's condition and will consider patient for interventional treatment at time of return appointment in attempt to decrease severe spasms occurring in the region of the neck which have been causing patient to have headache. We will proceed with greater occipital nerve block with myoneural block injection to be performed at time return appointment in attempt to decrease severity of patient's symptoms, minimize progression of patient's symptoms, and avoid the need for more involved treatment. The patient was with understanding and in agreement with suggested treatment plan. The patient will continue medications as prescribed at this time consisting of Zanaflex Neurontin Nucynta ER and oxycodone area the patient was with understanding and agreed to suggested treatment plan. The patient is tolerating medications well without undesirable side effects  Review of Systems     Objective:   Physical Exam  There was tenderness of the splenius capitis and occipitalis musculature region of moderate to moderately severe degree. Palpation of the trapezius musculature region reproduced severe pain on the left greater than on the right. There was tenderness to palpation over the cervical facet cervical paraspinal musculature region a moderate degree as well. Palpation of the  acromioclavicular and glenohumeral joint regions reproduce mild to moderate discomfort and patient was with unremarkable Spurling's maneuver. Tinel and Phalen's maneuver were without increase of pain of significant degree and patient was with slightly decreased grip strength. Palpation over the thoracic region thoracic facet region was attends to palpation with no crepitus of the thoracic region noted there was moderate muscle spasm of the upper mid and lower thoracic regions. Palpation over the lumbar paraspinal musculature region lumbar facet region was attends to palpation of mild to moderate degree with lateral bending rotation extension and palpation of the lumbar facets reproducing moderate discomfort. Palpation over the PSIS and PII S region reproduces moderate discomfort as well. Straight leg raising was tolerates approximately 20 without increase of pain with dorsiflexion noted. EHL strength was decreased on the right compared to the left. There was negative Homans. Palpation of the greater trochanteric region iliotibial band region reproduced mild to moderate discomfort. 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 Caution pill is stronger Nucynta ER and can cause respiratory depression, stopping of breathing, excessive sedation, confusion, and other  side effects as we reminded you at time of previous visit   Greater occipital nerve block to be performed at time of return appointment  F/U PCP Dr. Quay Burow for evaliation of  BP and general medical  condition.   F/U surgical evaluation. May consider pending follow-up evaluations as discussed  F/U neurological evaluation. May consider PNCV/EMG studies and other studies pending follow-up evaluations  F/U psych evaluation with Dr.H Su as planned   Ask the staff when you will be approved for radiofrequency rhizolysis lumbar facets as previously mentioned  May consider radiofrequency rhizolysis or intraspinal procedures pending response to present treatment and F/U evaluation   Patient to call [facility] should patient have concerns prior to scheduled return appointment.

## 2015-05-26 NOTE — Progress Notes (Signed)
Patient here for medication management Safety precautions to be maintained throughout the outpatient stay will include: orient to surroundings, keep bed in low position, maintain call bell within reach at all times, provide assistance with transfer out of bed and ambulation.  

## 2015-05-27 ENCOUNTER — Ambulatory Visit: Payer: Medicare Other | Admitting: Pain Medicine

## 2015-06-10 ENCOUNTER — Other Ambulatory Visit: Payer: Self-pay | Admitting: Internal Medicine

## 2015-06-10 DIAGNOSIS — Z1231 Encounter for screening mammogram for malignant neoplasm of breast: Secondary | ICD-10-CM

## 2015-06-23 ENCOUNTER — Encounter: Payer: Self-pay | Admitting: Pain Medicine

## 2015-06-23 ENCOUNTER — Telehealth: Payer: Self-pay | Admitting: *Deleted

## 2015-06-23 ENCOUNTER — Ambulatory Visit: Payer: Medicare Other | Attending: Pain Medicine | Admitting: Pain Medicine

## 2015-06-23 VITALS — BP 121/85 | HR 72 | Temp 98.1°F | Resp 14 | Ht 66.0 in | Wt 189.0 lb

## 2015-06-23 DIAGNOSIS — M5136 Other intervertebral disc degeneration, lumbar region: Secondary | ICD-10-CM

## 2015-06-23 DIAGNOSIS — R51 Headache: Secondary | ICD-10-CM | POA: Insufficient documentation

## 2015-06-23 DIAGNOSIS — G5700 Lesion of sciatic nerve, unspecified lower limb: Secondary | ICD-10-CM

## 2015-06-23 DIAGNOSIS — M5481 Occipital neuralgia: Secondary | ICD-10-CM

## 2015-06-23 DIAGNOSIS — M542 Cervicalgia: Secondary | ICD-10-CM | POA: Insufficient documentation

## 2015-06-23 DIAGNOSIS — M5030 Other cervical disc degeneration, unspecified cervical region: Secondary | ICD-10-CM | POA: Insufficient documentation

## 2015-06-23 DIAGNOSIS — E1340 Other specified diabetes mellitus with diabetic neuropathy, unspecified: Secondary | ICD-10-CM

## 2015-06-23 DIAGNOSIS — G5701 Lesion of sciatic nerve, right lower limb: Secondary | ICD-10-CM

## 2015-06-23 DIAGNOSIS — M47816 Spondylosis without myelopathy or radiculopathy, lumbar region: Secondary | ICD-10-CM

## 2015-06-23 MED ORDER — ORPHENADRINE CITRATE 30 MG/ML IJ SOLN
60.0000 mg | Freq: Once | INTRAMUSCULAR | Status: DC
  Filled 2015-06-23: qty 2

## 2015-06-23 MED ORDER — MIDAZOLAM HCL 5 MG/5ML IJ SOLN
5.0000 mg | Freq: Once | INTRAMUSCULAR | Status: DC
  Filled 2015-06-23: qty 5

## 2015-06-23 MED ORDER — TRIAMCINOLONE ACETONIDE 40 MG/ML IJ SUSP
40.0000 mg | Freq: Once | INTRAMUSCULAR | Status: DC
  Filled 2015-06-23: qty 1

## 2015-06-23 MED ORDER — LACTATED RINGERS IV SOLN: 1000.0000 mL | INTRAVENOUS | Status: DC

## 2015-06-23 MED ORDER — FENTANYL CITRATE (PF) 100 MCG/2ML IJ SOLN
100.0000 ug | Freq: Once | INTRAMUSCULAR | Status: DC
  Filled 2015-06-23: qty 2

## 2015-06-23 MED ORDER — BUPIVACAINE HCL (PF) 0.25 % IJ SOLN
30.0000 mL | Freq: Once | INTRAMUSCULAR | Status: DC
  Filled 2015-06-23: qty 30

## 2015-06-23 NOTE — Telephone Encounter (Signed)
Nurses Patient has been scheduled for evaluation for tomorrow to discuss medications and treatment plan as well as follow-up evaluations with her psychiatrist and primary care physician as she is presently doing . Thank you

## 2015-06-23 NOTE — Telephone Encounter (Signed)
Dr. Kingsley Spittle called and stated that she was concerned about the pt. She stated at her last visit she was very out of it, hard to wake her up. She wanted to know if its been a change in her meds?and she spoke with her son and he also stated she's been like this for about a month..she wanted to know is it anything that you're aware of that cause this to happen?..td

## 2015-06-23 NOTE — Progress Notes (Signed)
Safety precautions to be maintained throughout the outpatient stay will include: orient to surroundings, keep bed in low position, maintain call bell within reach at all times, provide assistance with transfer out of bed and ambulation.  

## 2015-06-23 NOTE — Patient Instructions (Addendum)
PLAN   Continue present medication Neurontin Zanaflex oxycodone and Nucynta ER. Caution pill  Nucynta ER and can cause respiratory depression, stopping of breathing, excessive sedation, confusion, and other side effects as we reminded you at time of previous visit   F/U PCP Dr. Quay Burow for evaliation of  BP and general medical  condition.   F/U surgical evaluation. May consider pending follow-up evaluations as discussed  F/U neurological evaluation. May consider PNCV/EMG studies and other studies pending follow-up evaluations  F/U psych evaluation. Please see Dr. Kasandra Knudsen this week for evaluation of your medications and for general psych evaluation as discussed  Urine drug screen to be obtained today  Ask the staff when you will be approved for radiofrequency rhizolysis lumbar facets as previously mentioned  May consider radiofrequency rhizolysis or intraspinal procedures pending response to present treatment and F/U evaluation   Patient to call [facility] should patient have concerns prior to scheduled return appointment.Occipital Nerve Block Patient Information  Description: The occipital nerves originate in the cervical (neck) spinal cord and travel upward through muscle and tissue to supply sensation to the back of the head and top of the scalp.  In addition, the nerves control some of the muscles of the scalp.  Occipital neuralgia is an irritation of these nerves which can cause headaches, numbness of the scalp, and neck discomfort.     The occipital nerve block will interrupt nerve transmission through these nerves and can relieve pain and spasm.  The block consists of insertion of a small needle under the skin in the back of the head to deposit local anesthetic (numbing medicine) and/or steroids around the nerve.  The entire block usually lasts less than 5 minutes.  Conditions which may be treated by occipital blocks:   Muscular pain and spasm of the scalp  Nerve irritation,  back of the head  Headaches  Upper neck pain  Preparation for the injection:  1. Do not eat any solid food or dairy products within 8 hours of your appointment. 2. You may drink clear liquids up to 3 hours before appointment.  Clear liquids include water, black coffee, juice or soda.  No milk or cream please. 3. You may take your regular medication, including pain medications, with a sip of water before you appointment.  Diabetics should hold regular insulin (if taken separately) and take 1/2 normal NPH dose the morning of the procedure.  Carry some sugar containing items with you to your appointment. 4. A driver must accompany you and be prepared to drive you home after your procedure. 5. Bring all your current medications with you. 6. An IV may be inserted and sedation may be given at the discretion of the physician. 7. A blood pressure cuff, EKG, and other monitors will often be applied during the procedure.  Some patients may need to have extra oxygen administered for a short period. 8. You will be asked to provide medical information, including your allergies and medications, prior to the procedure.  We must know immediately if you are taking blood thinners (like Coumadin/Warfarin) or if you are allergic to IV iodine contrast (dye).  We must know if you could possible be pregnant.  9. Do not wear a high collared shirt or turtleneck.  Tie long hair up in the back if possible.  Possible side-effects:   Bleeding from needle site  Infection (rare, may require surgery)  Nerve injury (rare)  Hair on back of neck can be tinged with iodine scrub (this  will wash out)  Light-headedness (temporary)  Pain at injection site (several days)  Decreased blood pressure (rare, temporary)  Seizure (very rare)  Call if you experience:   Hives or difficulty breathing ( go to the emergency room)  Inflammation or drainage at the injection site(s)  Please note:  Although the local  anesthetic injected can often make your painful muscles or headache feel good for several hours after the injection, the pain may return.  It takes 3-7 days for steroids to work.  You may not notice any pain relief for at least one week.  If effective, we will often do a series of injections spaced 3-6 weeks apart to maximally decrease your pain.  If you have any questions, please call [phone] [facility]

## 2015-06-23 NOTE — Progress Notes (Signed)
Subjective:    Patient ID: Annette [name], female    DOB: 1960/10/11, 55 y.o.   MRN: [id number]  HPI                                     BILATERAL OCCIPITAL NERVE BLOCKS  NOTE: The patient is a 55 y.o.-year-old female who returns to the [facility] for further evaluation and treatment of pain consisting of pain involving the region of the neck and headache.  Patient is with prior studies revealing patient to be with degenerative disc disease of the cervical spine . area the patient is with pain occurring the back of the cervical region radiating to the occipital region and continuing to the retro-orbital region. There is concern regarding significant component of patient's pain being due to occipital neuralgia contributing to occipital neuralgia headache   The risks, benefits, and expectations of the procedure have been discussed and explained to patient, who is understanding and wishes to proceed with interventional treatment as discussed and as explained to patient.  Will proceed with greater occipital nerve blocks with myoneural block injections at this time as discussed and as explained to patient.  All are understanding and in agreement with suggested treatment plan.    PROCEDURE:  Greater occipital nerve block on the left side with IV Versed, IV Fentanyl, conscious sedation, EKG, blood pressure, pulse, capnography, and pulse oximetry monitoring.  Procedure performed with patient in prone position.  Greater occipital nerve block on the left side.   With patient in prone position, Betadine prep of proposed entry site accomplished.  Following identification of the nuchal ridge, 22 -gauge needle was inserted at the level of the nuchal ridge medial to the occipital artery.  Following negative aspiration, 4cc 0.25% bupivacaine with Kenalog injected for left greater occipital nerve block.  Needle was removed.  Patient tolerated injection well.   Greater occipital nerve block on the rightt  side. The greater occipital nerve block on the right side was performed exactly as the left greater occipital nerve block was performed and utilizing the same technique  .  Myoneural block injections of the cervical region Following Betadine prep of proposed entry site a 22-gauge needle was inserted into the cervical paraspinal musculature region and following negative aspiration 2 cc of 0.25% bupivacaine with Norflex was injected for myoneural block injection of the cervical region 4  The patient tolerated the procedure well   A total of 10 mg Kenalog was utilized for the entire procedure.  PLAN:    1. Medications: We discussed patient's medications and will consider modifying patient's medications which presently consist of Neurontin Zanaflex oxycodone and Nucynta ER 2. Patient to follow up with primary care physician Dr. Quay Burow for evaluation of blood pressure and general medical condition status post procedure performed on today's visit. 3. Neurological evaluation for further assessment of headaches and for other studies as discussed. 4. Psych follow-up evaluation with Dr. Kasandra Knudsen as discussed 5. Surgical evaluation as discussed.  6. Patient may be candidate for Botox injections, radiofrequency procedures, as well as implantation type procedures pending response to treatment rendered on today's visit and pending follow-up evaluation. 7. Patient has been advised to adhere to proper body mechanics and to avoid activities which appear to aggravate condition.cations:  Will continue presently prescribed medications at this time. 8. The patient is understanding and in agreement with the suggested treatment plan.   Review  of Systems     Objective:   Physical Exam        Assessment & Plan:

## 2015-06-23 NOTE — Telephone Encounter (Signed)
Routed to Dr Primus Bravo.

## 2015-06-24 ENCOUNTER — Ambulatory Visit: Payer: Medicare Other | Attending: Pain Medicine | Admitting: Pain Medicine

## 2015-06-24 ENCOUNTER — Telehealth: Payer: Self-pay | Admitting: *Deleted

## 2015-06-24 ENCOUNTER — Encounter: Payer: Self-pay | Admitting: Pain Medicine

## 2015-06-24 VITALS — BP 145/75 | HR 81 | Temp 99.1°F | Resp 16 | Ht 65.0 in | Wt 289.0 lb

## 2015-06-24 DIAGNOSIS — Z96641 Presence of right artificial hip joint: Secondary | ICD-10-CM | POA: Insufficient documentation

## 2015-06-24 DIAGNOSIS — M5030 Other cervical disc degeneration, unspecified cervical region: Secondary | ICD-10-CM

## 2015-06-24 DIAGNOSIS — M5136 Other intervertebral disc degeneration, lumbar region: Secondary | ICD-10-CM | POA: Insufficient documentation

## 2015-06-24 DIAGNOSIS — M19012 Primary osteoarthritis, left shoulder: Secondary | ICD-10-CM | POA: Diagnosis not present

## 2015-06-24 DIAGNOSIS — M5481 Occipital neuralgia: Secondary | ICD-10-CM | POA: Diagnosis not present

## 2015-06-24 DIAGNOSIS — M533 Sacrococcygeal disorders, not elsewhere classified: Secondary | ICD-10-CM | POA: Diagnosis not present

## 2015-06-24 DIAGNOSIS — E1340 Other specified diabetes mellitus with diabetic neuropathy, unspecified: Secondary | ICD-10-CM

## 2015-06-24 DIAGNOSIS — M19011 Primary osteoarthritis, right shoulder: Secondary | ICD-10-CM | POA: Insufficient documentation

## 2015-06-24 DIAGNOSIS — G589 Mononeuropathy, unspecified: Secondary | ICD-10-CM | POA: Insufficient documentation

## 2015-06-24 DIAGNOSIS — F419 Anxiety disorder, unspecified: Secondary | ICD-10-CM | POA: Diagnosis not present

## 2015-06-24 DIAGNOSIS — M79606 Pain in leg, unspecified: Secondary | ICD-10-CM | POA: Diagnosis present

## 2015-06-24 DIAGNOSIS — M546 Pain in thoracic spine: Secondary | ICD-10-CM | POA: Diagnosis present

## 2015-06-24 DIAGNOSIS — Z96649 Presence of unspecified artificial hip joint: Secondary | ICD-10-CM | POA: Diagnosis not present

## 2015-06-24 DIAGNOSIS — M542 Cervicalgia: Secondary | ICD-10-CM | POA: Diagnosis present

## 2015-06-24 DIAGNOSIS — G5701 Lesion of sciatic nerve, right lower limb: Secondary | ICD-10-CM

## 2015-06-24 DIAGNOSIS — G5700 Lesion of sciatic nerve, unspecified lower limb: Secondary | ICD-10-CM

## 2015-06-24 DIAGNOSIS — M47816 Spondylosis without myelopathy or radiculopathy, lumbar region: Secondary | ICD-10-CM

## 2015-06-24 MED ORDER — OXYCODONE HCL 10 MG PO TABS: ORAL_TABLET | ORAL | Status: DC

## 2015-06-24 MED ORDER — TAPENTADOL HCL ER 150 MG PO TB12: ORAL_TABLET | ORAL | Status: DC

## 2015-06-24 NOTE — Telephone Encounter (Signed)
Patient notified her appt was at 0930. She states she didn't know when her appt is. Instructed to come as soon as possible.

## 2015-06-24 NOTE — Telephone Encounter (Signed)
No answer left message to call if needed. 

## 2015-06-24 NOTE — Progress Notes (Signed)
Subjective:    Patient ID: Annette [name], female    DOB: 07/10/1960, 55 y.o.   MRN: [id number]  HPI  The patient is a 55 year old female who returns to pain management for further evaluation and treatment of pain involving the neck entire back upper and lower extremity region headaches with pain of the lumbar and lower extremity region of significant degree as well. On today's visit we address patient's medications and decision was made to reduce patient's medications on today's visit. We also informed patient that we wish to have patient follow up Dr. Kasandra Knudsen to discuss patient's medications as well. There is concern regarding Xanax which appears to be causing excessive sedation and patient. We informed patient that we wish to have patient decrease Xanax dose and consider discontinuing the use of Xanax once this is approved by Dr. Kasandra Knudsen. The patient has been taking Xanax for component of anxiety. We will reduce patient's medications at this time and we have schedule patient for follow-up evaluation with Dr. Kasandra Knudsen. The patient is to go to help facilitate should she have significant increase in anxiety or have other symptoms that may need emergent treatment. We've caution patient regarding the medications and the potential for overdosing. The patient was with understanding and agreement suggested treatment plan and will follow-up Dr. Kasandra Knudsen as discussed. All were understanding and agreed with suggested treatment plan  Review of Systems     Objective:   Physical Exam  Palpation of the paraspinal misreading  Final facet region was attends proximal patient of mild degree with mild tenderness of the splenius capitis and occipitalis region there was mild tenderness of the thoracic facet thoracic paraspinal musculature region. No crepitus of the thoracic region noted. Palpation over the thoracic paraspinal misreading thoracic region was attends to palpation with no crepitus of the thoracic region noted. The patient  appeared to be with slightly decreased grip strength and Tinel and Phalen's maneuver were without increase of pain of significant degree. There was tends to palpation over the region of the lumbar paraspinal must reason lumbar facet region a moderate degree with lateral bending rotation extension and palpation of the lumbar facets reproducing moderate discomfort. Palpation of the PSIS and PII S region reproduces moderate discomfort with moderate tenderness of the gluteal and piriformis musculature region is well. There was decreased EHL strength on the right compared to the left. Please note the patient is status post total hip replacement with hematoma formation postoperatively resulting and sciatic nerve injury with pain of the right lower extremity felt to be due to prior injury consisting of total hip replacement comp gauge by hematoma formation resulting in drop foot of the right foot. The patient was attends to palpation over the greater trochanteric region iliotibial band region is well there was straight leg raising tolerates approximately 20 without increased pain with dorsiflexion noted. 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 Caution pill is stronger Nucynta ER and can cause respiratory depression, stopping of breathing, excessive sedation, confusion, and other side effects as we reminded you at time of previous visit  RECOMMEND [place] EVENTUALLY DISCONTINUE USE OF Duanne Moron. PLEASE DISCUSS DECREASING AND DISCONTINUING XANAX WITH DR.SU  F/U PCP Dr. Quay Burow for evaliation of  BP and general medical  condition.   F/U surgical evaluation. May consider pending follow-up evaluations as discussed  F/U neurological evaluation. May consider PNCV/EMG studies and other studies pending follow-up evaluations  F/U psych evaluation area please follow-up with Dr. Kasandra Knudsen this week to discuss medications and your psychiatric condition. The patient is to follow-up with Dr. Kasandra Knudsen this week go to another facility for evaluation and assessment of patient's condition as discussed and as explained to patient on today's visit  Ask the staff when you will be approved for radiofrequency rhizolysis lumbar facets as previously mentioned  May consider radiofrequency rhizolysis or intraspinal procedures pending response to present treatment and F/U evaluation   Patient to call [facility] should patient have concerns prior to scheduled return appointment.

## 2015-06-24 NOTE — Patient Instructions (Addendum)
PLAN   Continue present medication Neurontin Zanaflex oxycodone and Nucynta ER. Caution pill is stronger Nucynta ER and can cause respiratory depression, stopping of breathing, excessive sedation, confusion, and other side effects as we reminded you at time of previous visit  RECOMMEND [place] EVENTUALLY DISCONTINUE USE OF Duanne Moron. PLEASE DISCUSS DECREASING AND DISCONTINUING XANAX WITH DR.SU  F/U PCP Dr. Quay Burow for evaliation of  BP and general medical  condition.   F/U surgical evaluation. May consider pending follow-up evaluations as discussed  F/U neurological evaluation. May consider PNCV/EMG studies and other studies pending follow-up evaluations  F/U psych evaluation area please follow-up with Dr. Kasandra Knudsen this week to discuss medications and your psychiatric condition  Ask the staff when you will be approved for radiofrequency rhizolysis lumbar facets as previously mentioned  May consider radiofrequency rhizolysis or intraspinal procedures pending response to present treatment and F/U evaluation   Patient to call [facility] should patient have concerns prior to scheduled return appointment.

## 2015-06-24 NOTE — Telephone Encounter (Signed)
Nurses and Secretaries We have schedule patient to see Dr. Kasandra Knudsen by Monday or Tuesday of next week. We have also reduced patient's medications at this time and will make additional modifications of treatment regimen pending follow-up evaluation we have also requested Dr. Kasandra Knudsen to provide [place] with another facility where patient can be seen if he is unable to see patient this week or by the first of next week

## 2015-06-24 NOTE — Progress Notes (Signed)
   Subjective:    Patient ID: Annette [name], female    DOB: 1960/08/18, 55 y.o.   MRN: [id number]  HPI    Review of Systems     Objective:   Physical Exam        Assessment & Plan:

## 2015-06-24 NOTE — Telephone Encounter (Signed)
Pt called wanting to know if her prescription was ready. Please give her a call.Marland KitchenMarland KitchenTD

## 2015-06-24 NOTE — Progress Notes (Signed)
Safety precautions to be maintained throughout the outpatient stay will include: orient to surroundings, keep bed in low position, maintain call bell within reach at all times, provide assistance with transfer out of bed and ambulation.  

## 2015-06-24 NOTE — Telephone Encounter (Signed)
Gave pt appt for Dr. Collie Siad that appt is scheduled for 07/13/15@ 10:20am. Pt is aware...thanks

## 2015-06-24 NOTE — Telephone Encounter (Signed)
Annette [name] and Annette [name] needs appointment this week are by the first of next week. The [name] is in need of immediate adjustment of her medications. I suggested my medications on today's visit and need for Dr. Kasandra Knudsen to adjust medications which he is prescribing for [name] to avoid endangering [name]. Please emphasize to Dr. Lilla Shook staff that this [name] cannot wait and needs immediate evaluation Ask Dr. Kasandra Knudsen or his staff. He would like [name] to be seen if he cannot see [name] by Monday, 06/28/2015

## 2015-06-25 ENCOUNTER — Telehealth: Payer: Self-pay | Admitting: Pain Medicine

## 2015-06-25 NOTE — Telephone Encounter (Signed)
Patient called to say she has appt with Dr. Kasandra Knudsen on Tues 06-29-15 and wants to know why she cannot get her meds filled as she will be out of them ? Please call asap

## 2015-06-25 NOTE — Telephone Encounter (Signed)
Spoke with Dr. Primus Bravo, will allow Nucynta to be filled. Will not fill Oxycodone until after patient sees Dr. Kasandra Knudsen next week. Patient notified of this. Pharmacy contacted, instructed ok to fill Nucynta.

## 2015-06-28 ENCOUNTER — Telehealth: Payer: Self-pay | Admitting: *Deleted

## 2015-06-28 ENCOUNTER — Ambulatory Visit: Payer: Medicare Other | Attending: Internal Medicine

## 2015-06-28 NOTE — Telephone Encounter (Signed)
Pt called stating that her back is hurting really bad. She stated that she went to attend her appt with Dr. Collie Siad this morning and was wondering if Dr. Primus Bravo will now release her prescription? She also stated that the shot did not do any good , it's hurting, and she feel like she did not even get a shot. She stated to please call back asap..Thanks

## 2015-06-29 ENCOUNTER — Telehealth: Payer: Self-pay | Admitting: *Deleted

## 2015-06-29 NOTE — Telephone Encounter (Signed)
-----   Message from Mohammed Kindle, MD sent at 06/28/2015  5:05 PM EDT ----- Regarding: Medications Nurses Please inform patient that I will need latter from Dr. Kasandra Knudsen addressing the patient's changes in medication and date of patient's return appointment with Dr.Su as well

## 2015-06-29 NOTE — Telephone Encounter (Signed)
Called patient to let her know that Dr Primus Bravo has requested a note from Dr Kasandra Knudsen needing the changes in her medications and the date of her next appt with Dr Kasandra Knudsen.  Patient verbalizes u/o information given.

## 2015-07-01 ENCOUNTER — Telehealth: Payer: Self-pay | Admitting: *Deleted

## 2015-07-01 LAB — TOXASSURE SELECT 13 (MW), URINE

## 2015-07-01 NOTE — Telephone Encounter (Signed)
Is it a pain med Dr. Primus Bravo can give in between her other meds? She's requesting something for in between pain. Please call pt.Marland KitchenMarland KitchenThanks

## 2015-07-01 NOTE — Telephone Encounter (Signed)
Spoke with patient and her desire for additional pain medicine.  Explained to patient that Dr Primus Bravo would not prescribe additional pain medicine over the phone and she would have to wait until her next appt to discuss.  Patient states that nobody knows what she is going through and she is uncomfortable and hurting.  She states she guessed she would keep taking her BC powders in between that this gave her some relief.  Thelma Barge that I would let Dr Primus Bravo know about the [facility] powders to make sure this was ok but that he was not going to go up on medication for the same concerns that Dr Kasandra Knudsen had which was the fact that the xanax and oxycontin  Was making her too sleepy and they were afraid it would suppress her respirations.  Explained that this was our concern as well and the reason we would not continue to go up medication.

## 2015-07-02 NOTE — Progress Notes (Signed)
Quick Note:  Reviewed. ______ 

## 2015-07-12 ENCOUNTER — Telehealth: Payer: Self-pay | Admitting: Pain Medicine

## 2015-07-12 NOTE — Telephone Encounter (Signed)
Spoke with patient on the phone re; back pain and stomach pain.  Explained to patient that Dr Primus Bravo was not going to prescribe anything more for pain and she would either need to see her  PCP physician or keep her scheduled appt with GI doctor on 07/19/15.   Patient verbalizes u/o.

## 2015-07-12 NOTE — Telephone Encounter (Signed)
Having a lot of back and stomach pain, taking BC powders, has appt 6-12 with GI, is there anything dr crisp can do to help with pain?

## 2015-07-15 ENCOUNTER — Ambulatory Visit: Payer: Medicare Other

## 2015-07-16 ENCOUNTER — Ambulatory Visit: Payer: Medicare Other

## 2015-07-19 ENCOUNTER — Other Ambulatory Visit: Payer: Self-pay | Admitting: Gastroenterology

## 2015-07-19 DIAGNOSIS — R1084 Generalized abdominal pain: Secondary | ICD-10-CM

## 2015-07-20 ENCOUNTER — Ambulatory Visit: Payer: Medicare Other | Attending: Pain Medicine | Admitting: Pain Medicine

## 2015-07-20 ENCOUNTER — Encounter: Payer: Self-pay | Admitting: Pain Medicine

## 2015-07-20 VITALS — BP 136/87 | HR 83 | Temp 98.2°F | Resp 18 | Ht 65.0 in | Wt 285.0 lb

## 2015-07-20 DIAGNOSIS — M47816 Spondylosis without myelopathy or radiculopathy, lumbar region: Secondary | ICD-10-CM | POA: Diagnosis not present

## 2015-07-20 DIAGNOSIS — M5481 Occipital neuralgia: Secondary | ICD-10-CM

## 2015-07-20 DIAGNOSIS — G5700 Lesion of sciatic nerve, unspecified lower limb: Secondary | ICD-10-CM

## 2015-07-20 DIAGNOSIS — F419 Anxiety disorder, unspecified: Secondary | ICD-10-CM | POA: Insufficient documentation

## 2015-07-20 DIAGNOSIS — M546 Pain in thoracic spine: Secondary | ICD-10-CM | POA: Diagnosis present

## 2015-07-20 DIAGNOSIS — M545 Low back pain: Secondary | ICD-10-CM | POA: Diagnosis present

## 2015-07-20 DIAGNOSIS — R51 Headache: Secondary | ICD-10-CM | POA: Diagnosis present

## 2015-07-20 DIAGNOSIS — M533 Sacrococcygeal disorders, not elsewhere classified: Secondary | ICD-10-CM | POA: Diagnosis not present

## 2015-07-20 DIAGNOSIS — M5030 Other cervical disc degeneration, unspecified cervical region: Secondary | ICD-10-CM | POA: Diagnosis not present

## 2015-07-20 DIAGNOSIS — G5701 Lesion of sciatic nerve, right lower limb: Secondary | ICD-10-CM

## 2015-07-20 DIAGNOSIS — F329 Major depressive disorder, single episode, unspecified: Secondary | ICD-10-CM | POA: Insufficient documentation

## 2015-07-20 DIAGNOSIS — Z96649 Presence of unspecified artificial hip joint: Secondary | ICD-10-CM | POA: Diagnosis not present

## 2015-07-20 DIAGNOSIS — G588 Other specified mononeuropathies: Secondary | ICD-10-CM | POA: Diagnosis not present

## 2015-07-20 DIAGNOSIS — M19012 Primary osteoarthritis, left shoulder: Secondary | ICD-10-CM | POA: Insufficient documentation

## 2015-07-20 DIAGNOSIS — E1340 Other specified diabetes mellitus with diabetic neuropathy, unspecified: Secondary | ICD-10-CM

## 2015-07-20 DIAGNOSIS — M19011 Primary osteoarthritis, right shoulder: Secondary | ICD-10-CM | POA: Insufficient documentation

## 2015-07-20 DIAGNOSIS — M5136 Other intervertebral disc degeneration, lumbar region: Secondary | ICD-10-CM | POA: Diagnosis not present

## 2015-07-20 MED ORDER — TAPENTADOL HCL ER 150 MG PO TB12
ORAL_TABLET | ORAL | Status: DC
Start: 2015-07-20 — End: 2015-08-03

## 2015-07-20 MED ORDER — OXYCODONE HCL 10 MG PO TABS: ORAL_TABLET | ORAL | Status: DC

## 2015-07-20 MED ORDER — GABAPENTIN 600 MG PO TABS: ORAL_TABLET | ORAL | Status: DC

## 2015-07-20 MED ORDER — TIZANIDINE HCL 2 MG PO CAPS: ORAL_CAPSULE | ORAL | Status: DC

## 2015-07-20 NOTE — Progress Notes (Signed)
Safety precautions to be maintained throughout the outpatient stay will include: orient to surroundings, keep bed in low position, maintain call bell within reach at all times, provide assistance with transfer out of bed and ambulation.  

## 2015-07-20 NOTE — Progress Notes (Signed)
Saw GI physician, scheduled to have CT abdomen. Also given medication for abdominal pain, does not know the name of it.

## 2015-07-20 NOTE — Progress Notes (Signed)
Subjective:    Patient ID: Annette [name], female    DOB: 03-29-1960, 55 y.o.   MRN: [id number]  HPI  The patient is a 55 year old female who returns to pain management for further evaluation and treatment of pain involving the neck associated with headaches as well as pain involving the upper mid lower back lower extremity regions and upper extremity regions. At the present time patient is to undergo further evaluation of her general medical condition with GI study as planned. The patient states that her pain involving the neck and headaches appears to be fairly well-controlled at this time with lower back lower extremity pain occurring at times as well and of a moderate degree. We discussed patient's overall condition and decision was been made to continue medications as prescribed at this time. We will also advised patient to follow-up with Dr. Kasandra Knudsen to further address patient's medications including Xanax. As previously discussed this concern regarding patient's medications and especially Xanax and we wish to have patient decrease and discontinue the use of Xanax. We will remain available to consider patient for additional modifications of treatment regimen pending follow-up evaluation. The patient will continue present medications at this time including Neurontin Zanaflex oxycodone and Nucynta extended release. All agreed to suggested treatment plan  Review of Systems     Objective:   Physical Exam  There was tenderness over the splenius capitis and occipitalis region a moderate degree with moderate tenderness of the cervical facet cervical paraspinal musculature region. Palpation of the region of the acromioclavicular and glenohumeral joint regions reproduce moderate discomfort and patient was with mild to moderate difficulty performing drop test. Palpation over the thoracic facet thoracic paraspinal musculature region was attends to palpation of moderate degree without crepitus of the thoracic  region noted. The patient was with out significant increase of pain with Tinel and Phalen's maneuver and appeared to be with bilaterally equal grip strength. Palpation over the lumbar paraspinal musculatures and lumbar facet region was attends to palpation of moderate degree with lateral bending rotation extension and palpation of the lumbar facets reproducing moderate discomfort. There was moderate tenderness of the PSIS and PII S region as well as the greater trochanteric region iliotibial band region. There was decreased EHL strength on the right compared to the left. There was increased tends to palpation of the gluteal and piriformis musculature regions noted as well. Straight leg raise was tolerates approximately 20 without increase of pain with dorsiflexion noted. EHL strength appeared to be decreased. There was negative Homans. 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 Caution pill is stronger Nucynta ER and can cause respiratory depression, stopping of breathing, excessive sedation, confusion, and other side effects as we reminded you at time of previous visit  Recommend continued follow-up  evaluations with Dr.Su regarding Xanax and medications as discussed   F/U PCP Dr. Quay Burow for  evaliation of  BP and general medical   condition.   F/U surgical evaluation. May consider pending follow-up evaluations as discussed  F/U neurological evaluation. May consider PNCV/EMG studies and other studies pending follow-up evaluations  F/U GI evaluation as planned  F/U psych evaluation area please follow-up with Dr. Kasandra Knudsen as discussed  Ask the staff when you will be approved for radiofrequency rhizolysis lumbar facets as previously mentioned  May consider radiofrequency rhizolysis or intraspinal procedures pending response to present treatment and F/U evaluation   Patient to call Pain Management Center should patient have concerns prior to scheduled return appointment.

## 2015-07-20 NOTE — Patient Instructions (Addendum)
PLAN   Continue present medication Neurontin Zanaflex oxycodone and Nucynta ER. Caution pill is stronger Nucynta ER and can cause respiratory depression, stopping of breathing, excessive sedation, confusion, and other side effects as we reminded you at time of previous visit  Recommend continued follow-up  evaluations with Dr.Su regarding Xanax and medications as discussed   F/U PCP Dr. Quay Burow for evaliation of  BP and general medical  condition.   F/U surgical evaluation. May consider pending follow-up evaluations as discussed  F/U neurological evaluation. May consider PNCV/EMG studies and other studies pending follow-up evaluations  F/U GI evaluation as planned  F/U psych evaluation area please follow-up with Dr. Kasandra Knudsen as discussed  Ask the staff when you will be approved for radiofrequency rhizolysis lumbar facets as previously mentioned  May consider radiofrequency rhizolysis or intraspinal procedures pending response to present treatment and F/U evaluation   Patient to call Pain Management Center should patient have concerns prior to scheduled return appointment.Pain Management Discharge Instructions  General Discharge Instructions :  If you need to reach your doctor call: Monday-Friday 8:00 am - 4:00 pm at [phone] or toll free [phone].  After clinic hours [phone] to have operator reach doctor.  Bring all of your medication bottles to all your appointments in the pain clinic.  To cancel or reschedule your appointment with Pain Management please remember to call 24 hours in advance to avoid a fee.  Refer to the educational materials which you have been given on: General Risks, I had my Procedure. Discharge Instructions, Post Sedation.  Post Procedure Instructions:  The drugs you were given will stay in your system until tomorrow, so for the next 24 hours you should not drive, make any legal decisions or drink any alcoholic beverages.  You may eat anything you  prefer, but it is better to start with liquids then soups and crackers, and gradually work up to solid foods.  Please notify your doctor immediately if you have any unusual bleeding, trouble breathing or pain that is not related to your normal pain.  Depending on the type of procedure that was done, some parts of your body may feel week and/or numb.  This usually clears up by tonight or the next day.  Walk with the use of an assistive device or accompanied by an adult for the 24 hours.  You may use ice on the affected area for the first 24 hours.  Put ice in a Ziploc bag and cover with a towel and place against area 15 minutes on 15 minutes off.  You may switch to heat after 24 hours.

## 2015-07-21 ENCOUNTER — Ambulatory Visit: Payer: Medicare Other

## 2015-07-22 ENCOUNTER — Encounter: Payer: Medicare Other | Admitting: Pain Medicine

## 2015-07-23 ENCOUNTER — Ambulatory Visit
Admission: RE | Admit: 2015-07-23 | Discharge: 2015-07-23 | Disposition: A | Discharge status: Home / Self Care | Payer: Medicare Other | Source: Ambulatory Visit | Attending: Gastroenterology | Admitting: Gastroenterology

## 2015-07-23 DIAGNOSIS — R1084 Generalized abdominal pain: Secondary | ICD-10-CM | POA: Diagnosis present

## 2015-07-23 DIAGNOSIS — R222 Localized swelling, mass and lump, trunk: Secondary | ICD-10-CM | POA: Insufficient documentation

## 2015-07-23 DIAGNOSIS — C7800 Secondary malignant neoplasm of unspecified lung: Secondary | ICD-10-CM | POA: Diagnosis not present

## 2015-07-23 DIAGNOSIS — C787 Secondary malignant neoplasm of liver and intrahepatic bile duct: Secondary | ICD-10-CM | POA: Diagnosis not present

## 2015-07-23 DIAGNOSIS — R938 Abnormal findings on diagnostic imaging of other specified body structures: Secondary | ICD-10-CM | POA: Diagnosis not present

## 2015-07-27 ENCOUNTER — Telehealth: Payer: Self-pay

## 2015-07-27 NOTE — Telephone Encounter (Signed)
  Oncology Nurse Navigator Documentation  Navigator Location: [facility] (07/27/15 1100) Navigator Encounter Type: Telephone (07/27/15 1100) Telephone: Appt Confirmation/Clarification;Incoming Call (07/27/15 1100)                 Interventions: Coordination of Care;Referrals (07/27/15 1100)   Coordination of Care: Appts (07/27/15 1100)                  Time Spent with Patient: 15 (07/27/15 1100)   Received call from Stephens November NP at [facility]. Needs new consult appt for GYN Clinic this week. Provided appt for 10:30 this Wednesday 6/21 with Dr Fransisca Connors. She will patient today and give her appt.

## 2015-07-28 ENCOUNTER — Inpatient Hospital Stay: Payer: Medicare Other | Attending: Obstetrics and Gynecology | Admitting: Obstetrics and Gynecology

## 2015-07-28 ENCOUNTER — Other Ambulatory Visit: Payer: Self-pay

## 2015-07-28 ENCOUNTER — Telehealth: Payer: Self-pay

## 2015-07-28 ENCOUNTER — Inpatient Hospital Stay: Payer: Medicare Other

## 2015-07-28 VITALS — BP 146/85 | HR 82 | Temp 98.4°F | Wt 286.6 lb

## 2015-07-28 DIAGNOSIS — Z6841 Body Mass Index (BMI) 40.0 and over, adult: Secondary | ICD-10-CM | POA: Diagnosis not present

## 2015-07-28 DIAGNOSIS — Z7984 Long term (current) use of oral hypoglycemic drugs: Secondary | ICD-10-CM | POA: Diagnosis not present

## 2015-07-28 DIAGNOSIS — N289 Disorder of kidney and ureter, unspecified: Secondary | ICD-10-CM | POA: Diagnosis not present

## 2015-07-28 DIAGNOSIS — D390 Neoplasm of uncertain behavior of uterus: Secondary | ICD-10-CM | POA: Diagnosis not present

## 2015-07-28 DIAGNOSIS — F17210 Nicotine dependence, cigarettes, uncomplicated: Secondary | ICD-10-CM

## 2015-07-28 DIAGNOSIS — C7801 Secondary malignant neoplasm of right lung: Secondary | ICD-10-CM | POA: Diagnosis not present

## 2015-07-28 DIAGNOSIS — R1010 Upper abdominal pain, unspecified: Secondary | ICD-10-CM | POA: Diagnosis not present

## 2015-07-28 DIAGNOSIS — Z7982 Long term (current) use of aspirin: Secondary | ICD-10-CM | POA: Diagnosis not present

## 2015-07-28 DIAGNOSIS — M5136 Other intervertebral disc degeneration, lumbar region: Secondary | ICD-10-CM

## 2015-07-28 DIAGNOSIS — F329 Major depressive disorder, single episode, unspecified: Secondary | ICD-10-CM

## 2015-07-28 DIAGNOSIS — N858 Other specified noninflammatory disorders of uterus: Secondary | ICD-10-CM

## 2015-07-28 DIAGNOSIS — Z780 Asymptomatic menopausal state: Secondary | ICD-10-CM

## 2015-07-28 DIAGNOSIS — C787 Secondary malignant neoplasm of liver and intrahepatic bile duct: Secondary | ICD-10-CM | POA: Diagnosis not present

## 2015-07-28 DIAGNOSIS — M5030 Other cervical disc degeneration, unspecified cervical region: Secondary | ICD-10-CM | POA: Diagnosis not present

## 2015-07-28 DIAGNOSIS — E669 Obesity, unspecified: Secondary | ICD-10-CM

## 2015-07-28 DIAGNOSIS — Z79899 Other long term (current) drug therapy: Secondary | ICD-10-CM | POA: Diagnosis not present

## 2015-07-28 DIAGNOSIS — G8929 Other chronic pain: Secondary | ICD-10-CM | POA: Diagnosis not present

## 2015-07-28 DIAGNOSIS — J45909 Unspecified asthma, uncomplicated: Secondary | ICD-10-CM

## 2015-07-28 DIAGNOSIS — C786 Secondary malignant neoplasm of retroperitoneum and peritoneum: Secondary | ICD-10-CM

## 2015-07-28 DIAGNOSIS — Z79891 Long term (current) use of opiate analgesic: Secondary | ICD-10-CM | POA: Diagnosis not present

## 2015-07-28 DIAGNOSIS — I10 Essential (primary) hypertension: Secondary | ICD-10-CM | POA: Diagnosis not present

## 2015-07-28 DIAGNOSIS — M5481 Occipital neuralgia: Secondary | ICD-10-CM | POA: Diagnosis not present

## 2015-07-28 DIAGNOSIS — E1142 Type 2 diabetes mellitus with diabetic polyneuropathy: Secondary | ICD-10-CM | POA: Diagnosis not present

## 2015-07-28 DIAGNOSIS — E785 Hyperlipidemia, unspecified: Secondary | ICD-10-CM | POA: Diagnosis not present

## 2015-07-28 LAB — APTT: aPTT: 33 seconds (ref 24–36)

## 2015-07-28 LAB — CBC WITH DIFFERENTIAL/PLATELET
Basophils Absolute: 0 10*3/uL (ref 0–0.1)
Basophils Relative: 1 %
EOS PCT: 2 %
Eosinophils Absolute: 0.2 10*3/uL (ref 0–0.7)
HEMATOCRIT: 40.4 % (ref 35.0–47.0)
Hemoglobin: 13.6 g/dL (ref 12.0–16.0)
LYMPHS ABS: 2.1 10*3/uL (ref 1.0–3.6)
LYMPHS PCT: 27 %
MCH: 30.1 pg (ref 26.0–34.0)
MCHC: 33.6 g/dL (ref 32.0–36.0)
MCV: 89.6 fL (ref 80.0–100.0)
MONO ABS: 0.6 10*3/uL (ref 0.2–0.9)
MONOS PCT: 8 %
NEUTROS ABS: 5 10*3/uL (ref 1.4–6.5)
Neutrophils Relative %: 62 %
PLATELETS: 158 10*3/uL (ref 150–440)
RBC: 4.51 MIL/uL (ref 3.80–5.20)
RDW: 13.3 % (ref 11.5–14.5)
WBC: 7.9 10*3/uL (ref 3.6–11.0)

## 2015-07-28 LAB — COMPREHENSIVE METABOLIC PANEL
ALT: 15 U/L (ref 14–54)
ANION GAP: 9 (ref 5–15)
AST: 24 U/L (ref 15–41)
Albumin: 4.2 g/dL (ref 3.5–5.0)
Alkaline Phosphatase: 69 U/L (ref 38–126)
BILIRUBIN TOTAL: 0.3 mg/dL (ref 0.3–1.2)
BUN: 13 mg/dL (ref 6–20)
CO2: 27 mmol/L (ref 22–32)
Calcium: 9.4 mg/dL (ref 8.9–10.3)
Chloride: 100 mmol/L — ABNORMAL LOW (ref 101–111)
Creatinine, Ser: 1.01 mg/dL — ABNORMAL HIGH (ref 0.44–1.00)
GFR calc Af Amer: >60 mL/min (ref >60)
GFR calc non Af Amer: >60 mL/min (ref >60)
GLUCOSE: 113 mg/dL — AB (ref 65–99)
POTASSIUM: 4 mmol/L (ref 3.5–5.1)
Sodium: 136 mmol/L (ref 135–145)
TOTAL PROTEIN: 8 g/dL (ref 6.5–8.1)

## 2015-07-28 LAB — PROTIME-INR
INR: 1.04
Prothrombin Time: 13.8 seconds (ref 11.4–15.0)

## 2015-07-28 NOTE — Progress Notes (Signed)
Gynecologic Oncology Consult Visit   Referring Provider: Stephens November, NP  Chief Concern: uterine mass  Subjective:  Annette [name] is a 55 y.o. P2 female who is seen in consultation from Stephens November NP for uterine mass.   She developed upper abdominal pain about two weeks ago.  Saw her NP and a CT scan was ordered and was concerning for metastatic uterine cancer (see below).  Underwent menopause over 10 years ago and has not had any vaginal bleeding or discharge.  She has chronic pain and takes BCs along with oxycodone and Neurontin and is seen by a pain specialist due to other pain issues as noted in [place].  CT scan 6/16 IMPRESSION: 6.0 x 7.4 cm infiltrating mass along the lower uterine body, worrisome for neoplasm. This is poorly evaluated on unenhanced CT and partially obscured by streak artifact.  2.3 x 3.0 cm left pelvis soft tissue implant, worrisome for nodal/peritoneal metastasis.  Multifocal hepatic metastases, measuring up to 1.7 cm in segment 4B.  Multifocal pulmonary metastases, measuring up to 1.3 cm at the right lung base, incompletely visualized.  Problem List: Patient Active Problem List   Diagnosis Date Noted  . Uterine mass 07/28/2015  . 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 DDD (degenerative disc disease), lumbar 06/24/2014  . Facet syndrome, lumbar 06/24/2014  . Neuropathy due to secondary diabetes ([place]) 06/24/2014  . DDD (degenerative disc disease), cervical 06/24/2014  . Occipital neuralgia 06/24/2014    Past Medical History: Past Medical History  Diagnosis Date  . Hypertension   . Depression   . Peripheral neuropathy ([place])   . Renal insufficiency   . Diabetes mellitus without complication ([place])   . Onychomycosis   . Obesity   . DDD (degenerative disc disease), lumbar   . Menopausal syndrome   . Asthma   . Back pain   . Hyperlipidemia   . Insomnia     Past Surgical History: Past Surgical History  Procedure Laterality  Date  . Reduction mammaplasty Bilateral 12/15/2013  . Joint replacement Bilateral [id number]  . Shoulder surgery Left   . Cosmetic surgery      Family History: Family History  Problem Relation Age of Onset  . COPD Mother   . Sleep apnea Mother   . Alcohol abuse Mother   . Depression Mother   . Stroke Mother   . Heart disease Father   . Heart disease Sister    Social History: Social History   Social History  . Marital Status: Single    Spouse Name: N/A  . Number of Children: N/A  . Years of Education: N/A   Occupational History  . Not on file.   Social History Main Topics  . Smoking status: Current Every Day Smoker -- 0.20 packs/day    Types: Cigarettes  . Smokeless tobacco: Not on file  . Alcohol Use: No  . Drug Use: No  . Sexual Activity: Not on file   Other Topics Concern  . Not on file   Social History Narrative    Allergies: Allergies  Allergen Reactions  . Iodides Swelling  . Shellfish-Derived Products Swelling  . Povidone Iodine Swelling    Current Medications: Current Outpatient Prescriptions  Medication Sig Dispense Refill  . albuterol (PROVENTIL HFA;VENTOLIN HFA) 108 (90 BASE) MCG/ACT inhaler Inhale 2 puffs into the lungs every 6 (six) hours as needed.     . ALPRAZolam (XANAX) 1 MG tablet 1 mg 4 (four) times daily. 1/2 to 1 tablet tid    .  amoxicillin (AMOXIL) 500 MG capsule Take 500 mg by mouth 3 (three) times daily. Reported on 07/20/2015    . aspirin 81 MG tablet Take 81 mg by mouth daily. Reported on 07/20/2015    . beclomethasone (QVAR) 40 MCG/ACT inhaler Inhale 2 puffs into the lungs 2 (two) times daily. Reported on 05/26/2015    . doxycycline (VIBRA-TABS) 100 MG tablet Take 1 tablet (100 mg total) by mouth 2 (two) times daily. (Patient not taking: Reported on 04/28/2015) 14 tablet Eval  . doxycycline (VIBRA-TABS) 100 MG tablet Take 40 mg by mouth daily. Reported on 07/20/2015    . doxycycline (VIBRA-TABS) 100 MG tablet Take 20 mg by mouth daily.  Reported on 07/20/2015    . fluticasone (FLONASE) 50 MCG/ACT nasal spray Reported on 06/23/2015    . gabapentin (NEURONTIN) 600 MG tablet Limit 4 - 6  tabs by mouth per day if tolerated 180 tablet 0  . hydrochlorothiazide (HYDRODIURIL) 25 MG tablet Take 25 mg by mouth daily.     Marland Kitchen ibuprofen (ADVIL,MOTRIN) 800 MG tablet Take 800 mg by mouth 2 (two) times daily. Reported on 07/20/2015    . lisinopril (PRINIVIL,ZESTRIL) 10 MG tablet Take 10 mg by mouth daily.     . metFORMIN (GLUCOPHAGE) 500 MG tablet Take 500 mg by mouth daily.    . Oxycodone HCl 10 MG TABS Limit 1 tablet  by mouth per day or twice per day for breakthrough pain while taking Nucynta ER if tolerated 50 tablet 0  . pravastatin (PRAVACHOL) 40 MG tablet Take 40 mg by mouth daily.     Marland Kitchen PREMARIN vaginal cream Place 1 Applicatorful vaginally at bedtime.     . Tapentadol HCl (NUCYNTA ER) 150 MG TB12 Limit 1 tab by mouth  every 12 hours IF tolerated (note pill is now 150 mg and much stronger) 60 tablet 0  . tizanidine (ZANAFLEX) 2 MG capsule Limited 1-2 tabs by mouth twice a day to 3 times a day if tolerated 150 capsule 0  . traZODone (DESYREL) 150 MG tablet Take 150 mg by mouth at bedtime.      Current Facility-Administered Medications  Medication Dose Route Frequency Provider Last Rate Last Dose  . bupivacaine (PF) (MARCAINE) 0.25 % injection 30 mL  30 mL Other Once Mohammed Kindle, MD      . bupivacaine (PF) (MARCAINE) 0.25 % injection 30 mL  30 mL Other Once Mohammed Kindle, MD      . fentaNYL (SUBLIMAZE) injection 100 mcg  100 mcg Intravenous Once Mohammed Kindle, MD      . fentaNYL (SUBLIMAZE) injection 100 mcg  100 mcg Intravenous Once Mohammed Kindle, MD      . lactated ringers infusion 1,000 mL  1,000 mL Intravenous Continuous Mohammed Kindle, MD      . lactated ringers infusion 1,000 mL  1,000 mL Intravenous Continuous Mohammed Kindle, MD      . lactated ringers infusion 1,000 mL  1,000 mL Intravenous Continuous Mohammed Kindle, MD      .  midazolam (VERSED) 5 MG/5ML injection 5 mg  5 mg Intravenous Once Mohammed Kindle, MD      . midazolam (VERSED) 5 MG/5ML injection 5 mg  5 mg Intravenous Once Mohammed Kindle, MD      . orphenadrine (NORFLEX) injection 60 mg  60 mg Intramuscular Once Mohammed Kindle, MD      . orphenadrine (NORFLEX) injection 60 mg  60 mg Intramuscular Once Mohammed Kindle, MD      . orphenadrine ([place])  injection 60 mg  60 mg Intramuscular Once Mohammed Kindle, MD      . sodium chloride flush (NS) 0.9 % injection 20 mL  20 mL Other Once Mohammed Kindle, MD      . triamcinolone acetonide (KENALOG-40) injection 40 mg  40 mg Other Once Mohammed Kindle, MD      . triamcinolone acetonide (KENALOG-40) injection 40 mg  40 mg Other Once Mohammed Kindle, MD        Review of Systems General: negative for, fevers, chills, fatigue, changes in sleep, changes in weight or appetite Skin: negative for changes in color, texture, moles or lesions Eyes: negative for, changes in vision, pain, diplopia HEENT: negative for, change in hearing, pain, discharge, tinnitus, vertigo, voice changes, sore throat, neck masses Breasts: negative for breast lumps Pulmonary: negative for, dyspnea, orthopnea, productive cough Cardiac: negative for, palpitations, syncope, pain, discomfort, pressure Gastrointestinal: negative for, dysphagia, nausea, vomiting, jaundice, diarrhea, hematemesis, hematochezia Genitourinary/Sexual: negative for, dysuria, discharge, hesitancy, nocturia, retention, stones, infections, STD's, incontinence Ob/Gyn: negative for, irregular bleeding, pain Musculoskeletal: negative for, pain, stiffness, swelling, range of motion limitation Hematology: negative for, easy bruising, bleeding Neurologic/Psych: negative for, headaches, seizures, paralysis, weakness, tremor, change in gait, change in sensation, mood swings, depression, anxiety, change in memory  Objective:  Physical Examination:  BP 146/85 mmHg  Pulse 82  Temp(Src) 98.4 F  (36.9 C) (Oral)  Wt 286 lb 9.6 oz (130 kg)  Body mass index is 47.69 kg/(m^2).  ECOG Performance Status: 1 - Symptomatic but completely ambulatory  General appearance: alert, cooperative and appears stated age HEENT:neck supple with midline trachea and thyroid without masses Lymph node survey: non-palpable, axillary, inguinal, supraclavicular Cardiovascular: regular rate and rhythm, no murmurs or gallops Respiratory: normal air entry, lungs clear to auscultation and no rales, rhonchi or wheezing Breast exam: not examined. Abdomen: soft, non-tender, without masses or organomegaly, no hernias and well healed incision Back: inspection of back is normal Extremities: extremities normal, atraumatic, no cyanosis or edema Skin exam - normal coloration and turgor, no rashes, no suspicious skin lesions noted. Neurological exam reveals alert, oriented, normal speech, no focal findings or movement disorder noted.  Pelvic: exam chaperoned by nurse,   Vulva: normal appearing vulva with no masses, tenderness or lesions; Vagina: normal vagina; Adnexa: normal adnexa in size, nontender and no masses; Uterus: uterus is normal size, but feels fixed; Cervix: anteverted; Rectal: no masses     Assessment:  Annette [name] is a 55 y.o. female diagnosed with uterine mass with liver and lung nodules concerning for metastases.  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  Uterine adenocarcinoma unlikely due to lack of bleeding.   Medical co-morbidities complicating care: smoker, morbid obesity, diabetes Plan:   Problem List Items Addressed This Visit      Genitourinary   Uterine mass - Primary      We discussed options for management including CT guided liver biopsy and this will be scheduled today. Will also check CA125 and CEA.  I also discussed the case with Dr Grayland Ormond, who will be her oncologist if she needs chemotherapy.  The patient's diagnosis, an outline of the further diagnostic and  laboratory studies which will be required, the recommendation, and alternatives were discussed.  All questions were answered to the patient's satisfaction.  A total of 60 minutes were spent with the patient/family today; 50% was spent in education, counseling and coordination of care for uterine mass and possible liver/lung metastases.    Mellody Drown, MD  CC:  Ellamae Sia, MD [place], [place] [address] [phone]

## 2015-07-28 NOTE — Telephone Encounter (Signed)
Pt says she is in a lot of pain and has taken 4 Oxycodone per day. Please call pt she really wants to speak with Dr. Primus Bravo

## 2015-07-28 NOTE — Progress Notes (Signed)
  Oncology Nurse Navigator Documentation  Navigator Location: [facility] (07/28/15 1200) Navigator Encounter Type: Clinic/MDC (GYN ONC) (07/28/15 1200)     Confirmed Diagnosis Date: 07/23/15 (07/28/15 1200)     Patient Visit Type: Initial (07/28/15 1200)   Barriers/Navigation Needs: Education;Coordination of Care (07/28/15 1200)   Interventions: Coordination of Care (07/28/15 1200)   Coordination of Care: Other (CT Biopsy of liver) (07/28/15 1200)        Acuity: Level 2 (07/28/15 1200)   Acuity Level 2: Initial guidance, education and coordination as needed;Educational needs;Assistance expediting appointments;Ongoing guidance and education throughout treatment as needed (07/28/15 1200)     Time Spent with Patient: 60 (07/28/15 1200)   Chaperoned pelvic exam. Provided contact information for future questions or concerns. Scheduling for CT biopsy of liver lesion, and labs

## 2015-07-28 NOTE — Telephone Encounter (Signed)
Nurses Inform patient that she is to take medication as prescribed. Also informed patient that she is to avoid taking BC powders. Have patient go to the emergency department if she has severe pain which is in need of emergent treatment and have the emergency room department physician called me. Thank you very much

## 2015-07-28 NOTE — Telephone Encounter (Signed)
Called pt - States she has been in constant pain for over a month and is now taking 4 pain pills a day along with approx 2-4 BC powders a day. Annette [name] states that the [facility] powders were the only thing that will help ease her pain. She wants to know if it is OK for her to take two pain pills and BC powder in am,and also 2 pills at night with a bc powder.

## 2015-07-29 ENCOUNTER — Telehealth: Payer: Self-pay

## 2015-07-29 LAB — CEA: CEA: 2.7 ng/mL (ref 0.0–4.7)

## 2015-07-29 LAB — CA 125: CA 125: 21.6 U/mL (ref 0.0–38.1)

## 2015-07-29 NOTE — Telephone Encounter (Signed)
Per Dr Primus Bravo...Marland KitchenMarland KitchenAfter speaking with Dr Quay Burow, we will be bringing in patient next Tuesday.  Per Dr Primus Bravo, Juliann Pulse will be calling her for an appointment.

## 2015-07-29 NOTE — Telephone Encounter (Signed)
Attempted to call patient. Left message for patient to return our call.

## 2015-07-30 ENCOUNTER — Other Ambulatory Visit: Payer: Self-pay

## 2015-07-30 ENCOUNTER — Telehealth: Payer: Self-pay

## 2015-07-30 DIAGNOSIS — N858 Other specified noninflammatory disorders of uterus: Secondary | ICD-10-CM

## 2015-07-30 NOTE — Telephone Encounter (Signed)
  Oncology Nurse Navigator Documentation  Navigator Location: [facility] (07/30/15 1400) Navigator Encounter Type: Telephone (07/30/15 1400)                                          Time Spent with Patient: 15 (07/30/15 1400)   Received call back from scheduling. They can schedule liver biopsy for Monday 6-26, arrive at 0730, medical mall entrance. Nurse to call patient from specials regarding instructions.

## 2015-07-30 NOTE — Telephone Encounter (Signed)
  Oncology Nurse Navigator Documentation  Navigator Location: [facility] (07/30/15 1100)                                            Time Spent with Patient: 15 (07/30/15 1100)   Received call from Nancee Liter in [place]. Per Dr Kathlene Cote, he would like order changed to U/S guided. Order changed.

## 2015-08-02 ENCOUNTER — Encounter: Payer: Self-pay | Admitting: Internal Medicine

## 2015-08-02 ENCOUNTER — Ambulatory Visit
Admission: RE | Admit: 2015-08-02 | Discharge: 2015-08-02 | Disposition: A | Discharge status: Home / Self Care | Payer: Medicare Other | Source: Ambulatory Visit | Attending: Obstetrics and Gynecology | Admitting: Obstetrics and Gynecology

## 2015-08-02 DIAGNOSIS — Z9889 Other specified postprocedural states: Secondary | ICD-10-CM | POA: Insufficient documentation

## 2015-08-02 DIAGNOSIS — Z7984 Long term (current) use of oral hypoglycemic drugs: Secondary | ICD-10-CM | POA: Insufficient documentation

## 2015-08-02 DIAGNOSIS — E119 Type 2 diabetes mellitus without complications: Secondary | ICD-10-CM | POA: Insufficient documentation

## 2015-08-02 DIAGNOSIS — M5136 Other intervertebral disc degeneration, lumbar region: Secondary | ICD-10-CM | POA: Insufficient documentation

## 2015-08-02 DIAGNOSIS — E785 Hyperlipidemia, unspecified: Secondary | ICD-10-CM | POA: Diagnosis not present

## 2015-08-02 DIAGNOSIS — K5900 Constipation, unspecified: Secondary | ICD-10-CM | POA: Diagnosis not present

## 2015-08-02 DIAGNOSIS — Z780 Asymptomatic menopausal state: Secondary | ICD-10-CM | POA: Diagnosis not present

## 2015-08-02 DIAGNOSIS — Z888 Allergy status to other drugs, medicaments and biological substances status: Secondary | ICD-10-CM | POA: Insufficient documentation

## 2015-08-02 DIAGNOSIS — G629 Polyneuropathy, unspecified: Secondary | ICD-10-CM | POA: Insufficient documentation

## 2015-08-02 DIAGNOSIS — N858 Other specified noninflammatory disorders of uterus: Secondary | ICD-10-CM

## 2015-08-02 DIAGNOSIS — J45909 Unspecified asthma, uncomplicated: Secondary | ICD-10-CM | POA: Insufficient documentation

## 2015-08-02 DIAGNOSIS — I10 Essential (primary) hypertension: Secondary | ICD-10-CM | POA: Diagnosis not present

## 2015-08-02 DIAGNOSIS — G8929 Other chronic pain: Secondary | ICD-10-CM | POA: Insufficient documentation

## 2015-08-02 DIAGNOSIS — Z7982 Long term (current) use of aspirin: Secondary | ICD-10-CM | POA: Diagnosis not present

## 2015-08-02 DIAGNOSIS — C229 Malignant neoplasm of liver, not specified as primary or secondary: Secondary | ICD-10-CM | POA: Diagnosis not present

## 2015-08-02 DIAGNOSIS — N9489 Other specified conditions associated with female genital organs and menstrual cycle: Secondary | ICD-10-CM | POA: Insufficient documentation

## 2015-08-02 DIAGNOSIS — F17210 Nicotine dependence, cigarettes, uncomplicated: Secondary | ICD-10-CM | POA: Diagnosis not present

## 2015-08-02 DIAGNOSIS — R918 Other nonspecific abnormal finding of lung field: Secondary | ICD-10-CM | POA: Insufficient documentation

## 2015-08-02 DIAGNOSIS — E669 Obesity, unspecified: Secondary | ICD-10-CM | POA: Insufficient documentation

## 2015-08-02 DIAGNOSIS — Z79899 Other long term (current) drug therapy: Secondary | ICD-10-CM | POA: Insufficient documentation

## 2015-08-02 DIAGNOSIS — F329 Major depressive disorder, single episode, unspecified: Secondary | ICD-10-CM | POA: Insufficient documentation

## 2015-08-02 MED ORDER — SODIUM CHLORIDE 0.9 % IV SOLN
INTRAVENOUS | Status: DC
  Administered 2015-08-02: 08:00:00 via INTRAVENOUS

## 2015-08-02 MED ORDER — FENTANYL CITRATE (PF) 100 MCG/2ML IJ SOLN
INTRAMUSCULAR | Status: AC | PRN
  Administered 2015-08-02: 50 ug via INTRAVENOUS
  Administered 2015-08-02: 25 ug via INTRAVENOUS

## 2015-08-02 MED ORDER — OXYCODONE HCL 5 MG PO TABS
5.0000 mg | ORAL_TABLET | ORAL | Status: DC | PRN
  Administered 2015-08-02: 5 mg via ORAL
  Filled 2015-08-02: qty 1

## 2015-08-02 MED ORDER — MIDAZOLAM HCL 5 MG/5ML IJ SOLN
INTRAMUSCULAR | Status: AC | PRN
  Administered 2015-08-02: 0.5 mg via INTRAVENOUS
  Administered 2015-08-02: 1 mg via INTRAVENOUS
  Administered 2015-08-02 (×2): 0.5 mg via INTRAVENOUS

## 2015-08-02 NOTE — Procedures (Signed)
US guided core biopsies of a left hepatic lesion.  3 cores obtained and placed in formalin.  Minimal bleeding and no immediate complication.

## 2015-08-02 NOTE — Consult Note (Signed)
Chief Complaint: Patient was seen in consultation today for a liver biopsy at the request of Marissa  Referring Physician(s): Berchuck,Andrew  Patient Status: Outpatient  History of Present Illness: Annette [name] is a 55 y.o. female with abdominal pain and suspect metastatic disease on a CT of abdomen and pelvis from 07/23/15.  She complains of epigastric pain and lower sacral pain for about one month.  She already has chronic pain problems in neck and back with right leg sciatica.  She smokes a few cigarettes very day.  She is post menopausal.  Denies fevers or chills.  No shortness of breath and no chest pain.  Complains of constipation.  Past Medical History  Diagnosis Date  . Hypertension   . Depression   . Peripheral neuropathy ([place])   . Renal insufficiency   . Diabetes mellitus without complication ([place])   . Onychomycosis   . Obesity   . DDD (degenerative disc disease), lumbar   . Menopausal syndrome   . Asthma   . Back pain   . Hyperlipidemia   . Insomnia     Past Surgical History  Procedure Laterality Date  . Reduction mammaplasty Bilateral 12/15/2013  . Joint replacement Bilateral [id number]  . Shoulder surgery Left   . Cosmetic surgery      Allergies: Iodides; Iodine; Shellfish allergy; Shellfish-derived products; Iodinated diagnostic agents; Povidone iodine; and Povidone-iodine  Medications: Prior to Admission medications   Medication Sig Start Date End Date Taking? Authorizing Provider  ALPRAZolam Duanne Moron) 1 MG tablet  05/26/14  Yes Historical Provider, MD  aspirin EC 81 MG tablet Take 81 mg by mouth.   Yes Historical Provider, MD  conjugated estrogens (PREMARIN) vaginal cream Place vaginally. 04/24/14  Yes Historical Provider, MD  gabapentin (NEURONTIN) 600 MG tablet  04/28/15  Yes Historical Provider, MD  hydrochlorothiazide (HYDRODIURIL) 25 MG tablet Take by mouth.   Yes Historical Provider, MD  lisinopril (PRINIVIL,ZESTRIL) 10 MG tablet Take by  mouth.   Yes Historical Provider, MD  metFORMIN (GLUCOPHAGE) 500 MG tablet Take 500 mg by mouth daily.   Yes Historical Provider, MD  Tapentadol HCl (NUCYNTA ER) 150 MG TB12 Limit 1 tab by mouth  every 12 hours IF tolerated (note pill is now 150 mg and much stronger) 07/20/15  Yes Mohammed Kindle, MD  tiZANidine (ZANAFLEX) 2 MG tablet  05/21/15  Yes Historical Provider, MD  traZODone (DESYREL) 150 MG tablet Take by mouth. 05/26/14  Yes Historical Provider, MD  Vilazodone HCl (VIIBRYD) 40 MG TABS  06/21/15  Yes Historical Provider, MD  albuterol (PROAIR HFA) 108 (90 Base) MCG/ACT inhaler Inhale into the lungs.    Historical Provider, MD  beclomethasone (QVAR) 40 MCG/ACT inhaler Inhale 2 puffs into the lungs 2 (two) times daily. Reported on 05/26/2015    Historical Provider, MD  clonazePAM Bobbye Charleston) 1 MG tablet Reported on 08/02/2015 06/28/15   Historical Provider, MD  fluticasone Asencion Islam) 50 MCG/ACT nasal spray     Historical Provider, MD  HYDROcodone-acetaminophen (NORCO/VICODIN) 5-325 MG tablet Reported on 08/02/2015 06/17/15   Historical Provider, MD  ibuprofen (ADVIL,MOTRIN) 800 MG tablet Take 800 mg by mouth 2 (two) times daily. Reported on 08/02/2015    Historical Provider, MD  lactulose [facility]) 10 GM/15ML solution Reported on 08/02/2015 07/27/15   Historical Provider, MD  lisinopril-hydrochlorothiazide (PRINZIDE,ZESTORETIC) 10-12.5 MG tablet  07/03/15   Historical Provider, MD  naloxone Karma Greaser) 2 MG/2ML injection  06/21/15   Historical Provider, MD  ondansetron (ZOFRAN) 4 MG tablet Take 4 mg  by mouth. Reported on 08/02/2015 08/07/14   Historical Provider, MD  oxyCODONE (OXY IR/ROXICODONE) 5 MG immediate release tablet Take 5 mg by mouth. Reported on 08/02/2015 08/06/14   Historical Provider, MD  Oxycodone HCl 10 MG TABS Limit 1 tablet  by mouth per day or twice per day for breakthrough pain while taking Nucynta ER if tolerated Patient not taking: Reported on 08/02/2015 07/20/15   Mohammed Kindle, MD    pantoprazole (PROTONIX) 40 MG tablet  07/19/15   Historical Provider, MD  polyethylene glycol powder (GLYCOLAX/MIRALAX) powder  07/14/15   Historical Provider, MD  pravastatin (PRAVACHOL) 40 MG tablet Take 40 mg by mouth daily.  05/26/14   Historical Provider, MD  pravastatin (PRAVACHOL) 40 MG tablet Take by mouth. 05/26/14   Historical Provider, MD  PREMARIN vaginal cream Place 1 Applicatorful vaginally at bedtime.  04/24/14   Historical Provider, MD     Family History  Problem Relation Age of Onset  . COPD Mother   . Sleep apnea Mother   . Alcohol abuse Mother   . Depression Mother   . Stroke Mother   . Heart disease Father   . Heart disease Sister     Social History   Social History  . Marital Status: Single    Spouse Name: N/A  . Number of Children: N/A  . Years of Education: N/A   Social History Main Topics  . Smoking status: Current Every Day Smoker -- 0.20 packs/day    Types: Cigarettes  . Smokeless tobacco: None  . Alcohol Use: No  . Drug Use: No  . Sexual Activity: Not Asked   Other Topics Concern  . None   Social History Narrative      Review of Systems  Respiratory: Negative for shortness of breath.   Cardiovascular: Negative for chest pain.  Gastrointestinal: Positive for abdominal pain and constipation. Negative for abdominal distention.  Genitourinary: Negative for difficulty urinating.  Musculoskeletal: Positive for back pain.    Vital Signs: BP 132/94 mmHg  Pulse 79  Resp 17  SpO2 93%  Physical Exam  Constitutional:  Obese. Seems tired or drowsy   Cardiovascular: Normal rate, regular rhythm and normal heart sounds.   Pulmonary/Chest: Effort normal and breath sounds normal.  Abdominal: Soft. Bowel sounds are normal. She exhibits no distension. There is no tenderness.    Mallampati Score:  MD Evaluation Airway: WNL Heart: WNL Abdomen: WNL Chest/ Lungs: WNL ASA  Classification: 1 Mallampati/Airway Score: Two  Imaging: Ct Abdomen  Pelvis Wo Contrast  07/23/2015  CLINICAL DATA:  Generalized abdominal pain EXAM: CT ABDOMEN AND PELVIS WITHOUT CONTRAST TECHNIQUE: Multidetector CT imaging of the abdomen and pelvis was performed following the standard protocol without IV contrast. COMPARISON:  None. FINDINGS: Lower chest: Multiple pulmonary nodules in the bilateral lower lobes, including an 11 mm nodule in the lateral right lower lobe (series 4/ image 6) and a 13 mm nodule at the right lung base (series 4/ image 17), suspicious for metastases. Hepatobiliary: Numerous hypodense lesions in the liver are, worrisome for metastases. Dominant lesions include a 14 mm lesion in segment 2 (series 2/ image 15) and a 17 mm lesion in segment 4B (series 2/ image 22). Gallbladder is unremarkable. No intrahepatic or extrahepatic ductal dilatation. Pancreas: Within normal limits. Spleen: Within normal limits. Adrenals/Urinary Tract: Adrenal glands are within normal limits. Kidneys are within normal limits. No renal, ureteral, or bladder calculi. No hydronephrosis. Bladder is within normal limits. Stomach/Bowel: Stomach is within normal limits. No evidence  of bowel obstruction. Normal appendix (series 2/image 53). No colonic wall thickening or mass is evident on CT. Vascular/Lymphatic: No evidence of abdominal aortic aneurysm. No suspicious abdominal lymphadenopathy. Soft tissue along the left pelvis, including a 2.3 x 3.0 cm left pelvic soft tissue implant (series 2/ image 33), worrisome for nodal/peritoneal metastasis. Reproductive: Heterogeneous appearance of the uterus with suspected calcified fibroid in the posterior uterine body (series 2/ image 68). Additional 6.0 x 7.4 cm low-density, infiltrating mass along the lower uterine body (series 2/image 75), worrisome for neoplasm. This is partially obscured by streak artifact across the pelvis. Cervix is obscured. Bilateral ovaries are unremarkable. Other: No pelvic ascites. Musculoskeletal: Mild degenerative  changes of the visualized thoracolumbar spine. Bilateral hip arthroplasties in satisfactory position. Streak artifact obscuring the pelvis. IMPRESSION: 6.0 x 7.4 cm infiltrating mass along the lower uterine body, worrisome for neoplasm. This is poorly evaluated on unenhanced CT and partially obscured by streak artifact. 2.3 x 3.0 cm left pelvis soft tissue implant, worrisome for nodal/peritoneal metastasis. Multifocal hepatic metastases, measuring up to 1.7 cm in segment 4B. Multifocal pulmonary metastases, measuring up to 1.3 cm at the right lung base, incompletely visualized. Electronically Signed   By: Julian Hy M.D.   On: 07/23/2015 13:59    Labs:  CBC:  Recent Labs  07/28/15 1235  WBC 7.9  HGB 13.6  HCT 40.4  PLT 158    COAGS:  Recent Labs  07/28/15 1235  INR 1.04  APTT 33    BMP:  Recent Labs  07/28/15 1235  NA 136  K 4.0  CL 100*  CO2 27  GLUCOSE 113*  BUN 13  CALCIUM 9.4  CREATININE 1.01*  GFRNONAA >60  GFRAA >60    LIVER FUNCTION TESTS:  Recent Labs  07/28/15 1235  BILITOT 0.3  AST 24  ALT 15  ALKPHOS 69  PROT 8.0  ALBUMIN 4.2    TUMOR MARKERS:  Recent Labs  07/28/15 1235  CEA 2.7    Assessment and Plan:  55 yo female with abdominal pain and concern for metastatic disease based on recent CT.  There are pulmonary nodules, liver lesions and suspicious uterine mass.  A tissue diagnosis is needed.  Discussed image guided liver biopsy with the patient.  Explained the risks of bleeding and infection.  Informed consent obtained from patient.  Imaging and labs have been reviewed.  Will attempt liver lesion biopsy with [place] but may need to switch to CT due to patient's large body habitus.  Plan to use moderate sedation for biopsy.    Thank you for this interesting consult.  I greatly enjoyed meeting ANNELI KLAGES and look forward to participating in their care.  A copy of this report was sent to the requesting provider on this  date.  Electronically Signed: Carylon Perches 08/02/2015, 8:15 AM   I spent a total of  15 Minutes   in face to face in clinical consultation, greater than 50% of which was counseling/coordinating care for a liver biopsy.

## 2015-08-02 NOTE — Progress Notes (Signed)
Patient c/o 7:10 pain in her "bottom" when awake, but falls to sleep and sleeping well at present.

## 2015-08-03 ENCOUNTER — Encounter: Payer: Self-pay | Admitting: Pain Medicine

## 2015-08-03 ENCOUNTER — Ambulatory Visit: Payer: Medicare Other | Attending: Pain Medicine | Admitting: Pain Medicine

## 2015-08-03 VITALS — BP 144/64 | HR 85 | Temp 98.6°F | Resp 20 | Ht 65.0 in | Wt 286.0 lb

## 2015-08-03 DIAGNOSIS — M5136 Other intervertebral disc degeneration, lumbar region: Secondary | ICD-10-CM | POA: Diagnosis not present

## 2015-08-03 DIAGNOSIS — M47816 Spondylosis without myelopathy or radiculopathy, lumbar region: Secondary | ICD-10-CM | POA: Diagnosis not present

## 2015-08-03 DIAGNOSIS — M62830 Muscle spasm of back: Secondary | ICD-10-CM | POA: Diagnosis not present

## 2015-08-03 DIAGNOSIS — M19012 Primary osteoarthritis, left shoulder: Secondary | ICD-10-CM | POA: Diagnosis not present

## 2015-08-03 DIAGNOSIS — M5030 Other cervical disc degeneration, unspecified cervical region: Secondary | ICD-10-CM | POA: Diagnosis not present

## 2015-08-03 DIAGNOSIS — G5700 Lesion of sciatic nerve, unspecified lower limb: Secondary | ICD-10-CM

## 2015-08-03 DIAGNOSIS — F419 Anxiety disorder, unspecified: Secondary | ICD-10-CM | POA: Diagnosis not present

## 2015-08-03 DIAGNOSIS — M533 Sacrococcygeal disorders, not elsewhere classified: Secondary | ICD-10-CM | POA: Insufficient documentation

## 2015-08-03 DIAGNOSIS — R160 Hepatomegaly, not elsewhere classified: Secondary | ICD-10-CM | POA: Diagnosis not present

## 2015-08-03 DIAGNOSIS — M79606 Pain in leg, unspecified: Secondary | ICD-10-CM | POA: Diagnosis present

## 2015-08-03 DIAGNOSIS — M51369 Other intervertebral disc degeneration, lumbar region without mention of lumbar back pain or lower extremity pain: Secondary | ICD-10-CM

## 2015-08-03 DIAGNOSIS — M19011 Primary osteoarthritis, right shoulder: Secondary | ICD-10-CM | POA: Insufficient documentation

## 2015-08-03 DIAGNOSIS — M546 Pain in thoracic spine: Secondary | ICD-10-CM | POA: Diagnosis present

## 2015-08-03 DIAGNOSIS — F329 Major depressive disorder, single episode, unspecified: Secondary | ICD-10-CM | POA: Insufficient documentation

## 2015-08-03 DIAGNOSIS — G588 Other specified mononeuropathies: Secondary | ICD-10-CM | POA: Insufficient documentation

## 2015-08-03 DIAGNOSIS — M542 Cervicalgia: Secondary | ICD-10-CM | POA: Diagnosis present

## 2015-08-03 DIAGNOSIS — E1340 Other specified diabetes mellitus with diabetic neuropathy, unspecified: Secondary | ICD-10-CM

## 2015-08-03 DIAGNOSIS — Z96649 Presence of unspecified artificial hip joint: Secondary | ICD-10-CM | POA: Diagnosis not present

## 2015-08-03 DIAGNOSIS — M5481 Occipital neuralgia: Secondary | ICD-10-CM

## 2015-08-03 DIAGNOSIS — G5701 Lesion of sciatic nerve, right lower limb: Secondary | ICD-10-CM

## 2015-08-03 MED ORDER — OXYCODONE HCL 10 MG PO TABS: ORAL_TABLET | ORAL | Status: DC

## 2015-08-03 MED ORDER — TAPENTADOL HCL ER 150 MG PO TB12: ORAL_TABLET | ORAL | Status: DC

## 2015-08-03 NOTE — Patient Instructions (Addendum)
PLAN   Continue present medication Neurontin Zanaflex oxycodone and Nucynta ER. Caution pill is stronger Nucynta ER and can cause respiratory depression, stopping of breathing, excessive sedation, confusion, and other side effects as we reminded you at time of previous visit   F/U PCP Dr. Quay Burow for evaluation of  BP condition of liver and general medical  condition.   F/U surgical evaluation. May consider pending follow-up evaluations as discussed  F/U neurological evaluation. May consider PNCV/EMG studies and other studies pending follow-up evaluations. We will avoid such studies at this time F/U GI evaluation as planned  F/U psych evaluation Please follow-up with Dr. Kasandra Knudsen as discussed  Appointment oncology as discussed  May consider radiofrequency rhizolysis or intraspinal procedures pending response to present treatment and F/U evaluation   Patient to call [facility] should patient have concerns prior to scheduled return appointment.

## 2015-08-03 NOTE — Progress Notes (Signed)
Safety precautions to be maintained throughout the outpatient stay will include: orient to surroundings, keep bed in low position, maintain call bell within reach at all times, provide assistance with transfer out of bed and ambulation.  Pt states she is taking BC powder until sees dr crisp- also doubled up on pills sometimes 3 at a time

## 2015-08-03 NOTE — Progress Notes (Signed)
Subjective:    Patient ID: Annette [name], female    DOB: 1961/01/24, 55 y.o.   MRN: [id number]  HPI  The patient is a 55 year old female who returns to pain management for further evaluation and treatment of pain involving the neck entire back upper and lower extremity regions. The patient recently has been undergoing evaluation for questionable carcinoma of the liver. The patient is status post biopsy of liver and is awaiting results of the study. We discussed patient's condition with patient's primary care physician Dr. Quay Burow and decisions been made to increase patient's medications for treatment of patient's pain. The patient also has been prescribed Narcan and we'll continue under the care of Dr. Kasandra Knudsen her psychiatrist as well. We will avoid interventional treatment. The patient states that she has a able to tolerate Nucynta with oxycodone for breakthrough pain without drowsiness confusion. Patient states that she is able to tolerate for oxycodone which is a 10 mg size. We will adjust medications and will continue to observe patient's response to treatment. All agreed to suggested treatment plan  Review of Systems     Objective:   Physical Exam  There was tenderness of the splenius capitis and occipitalis region a moderate degree with moderate tenderness of the cervical facet cervical paraspinal musculature region. There was moderate tenderness of the thoracic facet thoracic paraspinal musculature region as well as moderate tenderness of the acromial clavicular and glenohumeral joint region. The patient appeared to be unremarkable Spurling's maneuver and was with bilaterally equal grip strength with Tinel and Phalen's maneuver reproducing minimal discomfort. Palpation over the thoracic region thoracic facet region was attends to palpation of moderate degree with moderate muscle spasms involving the mid and lower thoracic region. There was tenderness over the region of the lumbar facet lumbar  paraspinal musculature region lateral bending rotation extension and palpation of the lumbar facets reproducing moderate discomfort. Straight leg raise was tolerates approximately 20 with decreased EHL strength on the right compared to the left. The patient is status post total hip replacement with hematoma formation causing sciatic nerve palsy of the right lower extremity. There was no definite sensory deficit or dermatomal distribution of the lower extremities noted. The knees were attends to palpation of moderate degree. There was tenderness to palpation of the right abdominal region without rebound tenderness noted. 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 Caution pill is stronger Nucynta ER and can cause respiratory depression, stopping of breathing, excessive sedation, confusion, and other side effects as we reminded you at time of previous visit   F/U PCP Dr. Quay Burow for evaluation of  BP condition of liver and general medical  condition.   F/U surgical evaluation. May consider pending follow-up evaluations as discussed  F/U neurological evaluation. May consider PNCV/EMG studies and other studies pending follow-up evaluations. We will avoid such studies at this time F/U GI evaluation as planned  F/U psych  evaluation Please follow-up with Dr. Kasandra Knudsen as  discussed  Appointment oncology as discussed  May consider radiofrequency rhizolysis or intraspinal procedures pending response to present treatment and F/U evaluation   Patient to call [facility] Management Center should patient have concerns prior to scheduled return appointment.

## 2015-08-11 ENCOUNTER — Telehealth: Payer: Self-pay | Admitting: *Deleted

## 2015-08-11 ENCOUNTER — Telehealth: Payer: Self-pay

## 2015-08-11 NOTE — Telephone Encounter (Signed)
Spoke with the patient regarding her biopsy results.  Informed patient that the results had not been returned yet, and our office would contact her when we received them.

## 2015-08-11 NOTE — Telephone Encounter (Signed)
Pt called and was inquiring about whether or not she had cancer. Instructed to call the doctor that is testing her for that to inquire. She also states that she cant get medicine filled till tomorrow per pharm- Insformed pt that we could not control when pharm can fill medications and that Dr Primus Bravo is OOT until Friday.

## 2015-08-11 NOTE — Telephone Encounter (Signed)
Mrs. Derman would like for a nurse to give her a call...td

## 2015-08-11 NOTE — Telephone Encounter (Signed)
Pt needs to discuss her scripts

## 2015-08-14 DIAGNOSIS — Z6841 Body Mass Index (BMI) 40.0 and over, adult: Secondary | ICD-10-CM

## 2015-08-16 ENCOUNTER — Telehealth: Payer: Self-pay

## 2015-08-16 NOTE — Telephone Encounter (Signed)
  Oncology Nurse Navigator Documentation  Navigator Location: [facility] (08/16/15 0900) Navigator Encounter Type: Telephone (08/16/15 0900)                                          Time Spent with Patient: 15 (08/16/15 0900)   Returning voicemail from patient. Her aide states she has taken her medication and is incoherant at this time.

## 2015-08-18 ENCOUNTER — Inpatient Hospital Stay: Payer: Medicare Other | Attending: Internal Medicine | Admitting: Internal Medicine

## 2015-08-18 ENCOUNTER — Inpatient Hospital Stay: Payer: Medicare Other | Admitting: Obstetrics and Gynecology

## 2015-08-18 ENCOUNTER — Encounter: Payer: Medicare Other | Admitting: Pain Medicine

## 2015-08-18 VITALS — BP 135/90 | HR 89 | Temp 98.1°F | Ht 65.0 in

## 2015-08-18 DIAGNOSIS — G629 Polyneuropathy, unspecified: Secondary | ICD-10-CM

## 2015-08-18 DIAGNOSIS — G4700 Insomnia, unspecified: Secondary | ICD-10-CM | POA: Diagnosis not present

## 2015-08-18 DIAGNOSIS — N858 Other specified noninflammatory disorders of uterus: Secondary | ICD-10-CM

## 2015-08-18 DIAGNOSIS — I10 Essential (primary) hypertension: Secondary | ICD-10-CM

## 2015-08-18 DIAGNOSIS — M21371 Foot drop, right foot: Secondary | ICD-10-CM | POA: Diagnosis not present

## 2015-08-18 DIAGNOSIS — Z7982 Long term (current) use of aspirin: Secondary | ICD-10-CM | POA: Diagnosis not present

## 2015-08-18 DIAGNOSIS — J45909 Unspecified asthma, uncomplicated: Secondary | ICD-10-CM

## 2015-08-18 DIAGNOSIS — R0602 Shortness of breath: Secondary | ICD-10-CM | POA: Diagnosis not present

## 2015-08-18 DIAGNOSIS — N2889 Other specified disorders of kidney and ureter: Secondary | ICD-10-CM

## 2015-08-18 DIAGNOSIS — R410 Disorientation, unspecified: Secondary | ICD-10-CM | POA: Insufficient documentation

## 2015-08-18 DIAGNOSIS — E669 Obesity, unspecified: Secondary | ICD-10-CM

## 2015-08-18 DIAGNOSIS — E119 Type 2 diabetes mellitus without complications: Secondary | ICD-10-CM | POA: Diagnosis not present

## 2015-08-18 DIAGNOSIS — N644 Mastodynia: Secondary | ICD-10-CM | POA: Diagnosis not present

## 2015-08-18 DIAGNOSIS — M852 Hyperostosis of skull: Secondary | ICD-10-CM | POA: Diagnosis not present

## 2015-08-18 DIAGNOSIS — C7800 Secondary malignant neoplasm of unspecified lung: Secondary | ICD-10-CM

## 2015-08-18 DIAGNOSIS — M5136 Other intervertebral disc degeneration, lumbar region: Secondary | ICD-10-CM | POA: Diagnosis not present

## 2015-08-18 DIAGNOSIS — Z79899 Other long term (current) drug therapy: Secondary | ICD-10-CM | POA: Diagnosis not present

## 2015-08-18 DIAGNOSIS — C787 Secondary malignant neoplasm of liver and intrahepatic bile duct: Secondary | ICD-10-CM | POA: Diagnosis not present

## 2015-08-18 DIAGNOSIS — I700 Atherosclerosis of aorta: Secondary | ICD-10-CM | POA: Insufficient documentation

## 2015-08-18 DIAGNOSIS — G8929 Other chronic pain: Secondary | ICD-10-CM | POA: Diagnosis not present

## 2015-08-18 DIAGNOSIS — Z7984 Long term (current) use of oral hypoglycemic drugs: Secondary | ICD-10-CM | POA: Diagnosis not present

## 2015-08-18 DIAGNOSIS — E785 Hyperlipidemia, unspecified: Secondary | ICD-10-CM | POA: Diagnosis not present

## 2015-08-18 DIAGNOSIS — C548 Malignant neoplasm of overlapping sites of corpus uteri: Secondary | ICD-10-CM

## 2015-08-18 DIAGNOSIS — F329 Major depressive disorder, single episode, unspecified: Secondary | ICD-10-CM | POA: Diagnosis not present

## 2015-08-18 DIAGNOSIS — C549 Malignant neoplasm of corpus uteri, unspecified: Secondary | ICD-10-CM | POA: Insufficient documentation

## 2015-08-18 DIAGNOSIS — F17210 Nicotine dependence, cigarettes, uncomplicated: Secondary | ICD-10-CM | POA: Diagnosis not present

## 2015-08-18 NOTE — Assessment & Plan Note (Addendum)
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 Stage IV- reviewed with the patient and family at length. Unfortunately final histology still not available.   # Discussed with the patient and family that- as the final histology is still not available- difficult for me to give the treatment options at this time. Verbal report from Dr. Georgia Dom either leiomyosarcoma versus PECOMA. Treatment options/response to treatment/prognosis would depend Upon histology.  # Patient will likely need a port; also chemotherapy education once more information available.  # Chronic pain- currently managed by Dr.Crisp- no clear evidence of malignancy causing the pain at this time.   # For now recommend a CT of the chest; await the results of the histology- will give family call when available- at [name].  The above plan of care was discussed with Dr.Berchuck.   Thank you Dr. Fransisca Connors for the referral and allowing me to participate in the care of your pleasant patient.

## 2015-08-18 NOTE — Progress Notes (Signed)
Patient here for results. Complaints of lower back and stomach pain for the past month." Pain medication helps for awhile".

## 2015-08-18 NOTE — Progress Notes (Signed)
[place] CONSULT NOTE  Patient Care Team: Ellamae Sia, MD as PCP - General (Internal Medicine) Clent Jacks, RN as Registered Nurse  CHIEF COMPLAINTS/PURPOSE OF CONSULTATION:  Oncology History   # July 2017 UTERINE MALIGNANCY-s/p Liver bx [Dr.Berhcuk]; Uterine mass; liver lesion;  lung nodules  # 2002 & 2004 hip Surgery/chronic pain [Dr.Crsip]/Right foot drop- limited mobility/Obese      Malignant neoplasm of overlapping sites of body of uterus ([place])   08/18/2015 Initial Diagnosis Malignant neoplasm of overlapping sites of body of uterus ([facility])    HISTORY OF PRESENTING ILLNESS:  Annette [name] 55 y.o.  female with history of chronic pain recommended a previous back surgery; limited mobility- noted to have abdominal pain over the last few weeks. She denied any menorrhagia. She had a CT scan- after evaluation with GI that showed uterine mass; also lung nodules; liver nodules. Patient was evaluated by GYN oncology- and recommended a liver biopsy- positive for malignancy; preliminary; final pathology pending.  Patient is chronic back pain- she follows up with pain clinic. Denies any unusual shortness of breath or chest pain. Patient has chronic mild tingling and numbness of extremities. No nausea no vomiting. No chest pain.   ROS: A complete 10 point review of system is done which is negative except mentioned above in history of present illness  MEDICAL HISTORY:  Past Medical History  Diagnosis Date  . Hypertension   . Depression   . Peripheral neuropathy ([place])   . Renal insufficiency   . Diabetes mellitus without complication ([place])   . Onychomycosis   . Obesity   . DDD (degenerative disc disease), lumbar   . Menopausal syndrome   . Asthma   . Back pain   . Hyperlipidemia   . Insomnia     SURGICAL HISTORY: Past Surgical History  Procedure Laterality Date  . Reduction mammaplasty Bilateral 12/15/2013  . Joint replacement Bilateral [id number]  .  Shoulder surgery Left   . Cosmetic surgery    . Liver biopsy      SOCIAL HISTORY: Phillip Heal; live by self; smoking; no alcohol. She has children/family support. Social History   Social History  . Marital Status: Single    Spouse Name: N/A  . Number of Children: N/A  . Years of Education: N/A   Occupational History  . Not on file.   Social History Main Topics  . Smoking status: Current Every Day Smoker -- 0.20 packs/day    Types: Cigarettes  . Smokeless tobacco: Not on file  . Alcohol Use: No  . Drug Use: No  . Sexual Activity: Not on file   Other Topics Concern  . Not on file   Social History Narrative    FAMILY HISTORY: Family History  Problem Relation Age of Onset  . COPD Mother   . Sleep apnea Mother   . Alcohol abuse Mother   . Depression Mother   . Stroke Mother   . Heart disease Father   . Heart disease Sister     ALLERGIES:  is allergic to iodides; iodine; shellfish allergy; shellfish-derived products; iodinated diagnostic agents; povidone iodine; and povidone-iodine.  MEDICATIONS:  Current Outpatient Prescriptions  Medication Sig Dispense Refill  . albuterol (PROAIR HFA) 108 (90 Base) MCG/ACT inhaler Inhale into the lungs.    . ALPRAZolam (XANAX) 1 MG tablet     . aspirin EC 81 MG tablet Take 81 mg by mouth.    . beclomethasone (QVAR) 40 MCG/ACT inhaler Inhale 2 puffs into  the lungs 2 (two) times daily. Reported on 05/26/2015    . clonazePAM (KLONOPIN) 1 MG tablet Reported on 08/18/2015    . conjugated estrogens (PREMARIN) vaginal cream Place vaginally.    . fluticasone (FLONASE) 50 MCG/ACT nasal spray     . gabapentin (NEURONTIN) 600 MG tablet 2 (two) times daily.     . hydrochlorothiazide (HYDRODIURIL) 25 MG tablet Take by mouth.    Marland Kitchen HYDROcodone-acetaminophen (NORCO/VICODIN) 5-325 MG tablet Reported on 08/03/2015    . ibuprofen (ADVIL,MOTRIN) 800 MG tablet Take 800 mg by mouth 2 (two) times daily. Reported on 08/03/2015    . lactulose (CHRONULAC) 10  GM/15ML solution Reported on 08/02/2015    . lisinopril (PRINIVIL,ZESTRIL) 10 MG tablet Take by mouth.    Marland Kitchen lisinopril-hydrochlorothiazide (PRINZIDE,ZESTORETIC) 10-12.5 MG tablet     . metFORMIN (GLUCOPHAGE) 500 MG tablet Take 500 mg by mouth daily.    . naloxone [facility]) 2 MG/2ML injection     . ondansetron (ZOFRAN) 4 MG tablet Take 4 mg by mouth. Reported on 08/02/2015    . oxyCODONE (OXY IR/ROXICODONE) 5 MG immediate release tablet Take 5 mg by mouth. Reported on 08/02/2015    . Oxycodone HCl 10 MG TABS Limit 1 tablet  by mouth per day or 2-4 times per day for breakthrough pain while taking Nucynta ER if tolerated 120 tablet 0  . pantoprazole (PROTONIX) 40 MG tablet     . polyethylene glycol powder (GLYCOLAX/MIRALAX) powder     . pravastatin (PRAVACHOL) 40 MG tablet Take 40 mg by mouth daily.     . pravastatin (PRAVACHOL) 40 MG tablet Take by mouth.    Marland Kitchen PREMARIN vaginal cream Place 1 Applicatorful vaginally at bedtime.     . Tapentadol HCl (NUCYNTA ER) 150 MG TB12 Limit 1 tab by mouth  every 12 hours IF tolerated (note pill is now 150 mg and much stronger) 60 tablet 0  . tiZANidine (ZANAFLEX) 2 MG tablet     . traZODone (DESYREL) 150 MG tablet Take by mouth.    . Vilazodone HCl (VIIBRYD) 40 MG TABS      Current Facility-Administered Medications  Medication Dose Route Frequency Provider Last Rate Last Dose  . bupivacaine (PF) (MARCAINE) 0.25 % injection 30 mL  30 mL Other Once Mohammed Kindle, MD      . bupivacaine (PF) (MARCAINE) 0.25 % injection 30 mL  30 mL Other Once Mohammed Kindle, MD      . fentaNYL (SUBLIMAZE) injection 100 mcg  100 mcg Intravenous Once Mohammed Kindle, MD      . fentaNYL (SUBLIMAZE) injection 100 mcg  100 mcg Intravenous Once Mohammed Kindle, MD      . lactated ringers infusion 1,000 mL  1,000 mL Intravenous Continuous Mohammed Kindle, MD      . lactated ringers infusion 1,000 mL  1,000 mL Intravenous Continuous Mohammed Kindle, MD      . lactated ringers infusion 1,000 mL   1,000 mL Intravenous Continuous Mohammed Kindle, MD      . midazolam (VERSED) 5 MG/5ML injection 5 mg  5 mg Intravenous Once Mohammed Kindle, MD      . midazolam (VERSED) 5 MG/5ML injection 5 mg  5 mg Intravenous Once Mohammed Kindle, MD      . orphenadrine (NORFLEX) injection 60 mg  60 mg Intramuscular Once Mohammed Kindle, MD      . orphenadrine (NORFLEX) injection 60 mg  60 mg Intramuscular Once Mohammed Kindle, MD      . orphenadrine (NORFLEX) injection  60 mg  60 mg Intramuscular Once Mohammed Kindle, MD      . sodium chloride flush (NS) 0.9 % injection 20 mL  20 mL Other Once Mohammed Kindle, MD      . triamcinolone acetonide (KENALOG-40) injection 40 mg  40 mg Other Once Mohammed Kindle, MD      . triamcinolone acetonide (KENALOG-40) injection 40 mg  40 mg Other Once Mohammed Kindle, MD          .  PHYSICAL EXAMINATION: ECOG PERFORMANCE STATUS: 2 - Symptomatic, <50% confined to bed  There were no vitals filed for this visit. There were no vitals filed for this visit.  GENERAL: Well-nourished well-developed; Alert, no distress and comfortable. Accompanied by family. Patient sitting in wheelchair. She has drowsy; Obese.  EYES: no pallor or icterus OROPHARYNX: no thrush or ulceration;  NECK: supple, no masses felt LYMPH:  no palpable lymphadenopathy in the cervical, axillary or inguinal regions LUNGS: clear to auscultation and  No wheeze or crackles HEART/CVS: regular rate & rhythm and no murmurs; No lower extremity edema ABDOMEN: abdomen soft, non-tender and normal bowel sounds Musculoskeletal:no cyanosis of digits and no clubbing  PSYCH: drowsy-but easily arousable & oriented x 3 NEURO: no focal motor/sensory deficits SKIN:  no rashes or significant lesions  LABORATORY DATA:  I have reviewed the data as listed Lab Results  Component Value Date   WBC 7.9 07/28/2015   HGB 13.6 07/28/2015   HCT 40.4 07/28/2015   MCV 89.6 07/28/2015   PLT 158 07/28/2015    Recent Labs  07/28/15 1235  NA  136  K 4.0  CL 100*  CO2 27  GLUCOSE 113*  BUN 13  CREATININE 1.01*  CALCIUM 9.4  GFRNONAA >60  GFRAA >60  PROT 8.0  ALBUMIN 4.2  AST 24  ALT 15  ALKPHOS 69  BILITOT 0.3    RADIOGRAPHIC STUDIES: I have personally reviewed the radiological images as listed and agreed with the findings in the report. Ct Abdomen Pelvis Wo Contrast  07/23/2015  CLINICAL DATA:  Generalized abdominal pain EXAM: CT ABDOMEN AND PELVIS WITHOUT CONTRAST TECHNIQUE: Multidetector CT imaging of the abdomen and pelvis was performed following the standard protocol without IV contrast. COMPARISON:  None. FINDINGS: Lower chest: Multiple pulmonary nodules in the bilateral lower lobes, including an 11 mm nodule in the lateral right lower lobe (series 4/ image 6) and a 13 mm nodule at the right lung base (series 4/ image 17), suspicious for metastases. Hepatobiliary: Numerous hypodense lesions in the liver are, worrisome for metastases. Dominant lesions include a 14 mm lesion in segment 2 (series 2/ image 15) and a 17 mm lesion in segment 4B (series 2/ image 22). Gallbladder is unremarkable. No intrahepatic or extrahepatic ductal dilatation. Pancreas: Within normal limits. Spleen: Within normal limits. Adrenals/Urinary Tract: Adrenal glands are within normal limits. Kidneys are within normal limits. No renal, ureteral, or bladder calculi. No hydronephrosis. Bladder is within normal limits. Stomach/Bowel: Stomach is within normal limits. No evidence of bowel obstruction. Normal appendix (series 2/image 53). No colonic wall thickening or mass is evident on CT. Vascular/Lymphatic: No evidence of abdominal aortic aneurysm. No suspicious abdominal lymphadenopathy. Soft tissue along the left pelvis, including a 2.3 x 3.0 cm left pelvic soft tissue implant (series 2/ image 33), worrisome for nodal/peritoneal metastasis. Reproductive: Heterogeneous appearance of the uterus with suspected calcified fibroid in the posterior uterine body  (series 2/ image 68). Additional 6.0 x 7.4 cm low-density, infiltrating mass along the lower uterine  body (series 2/image 75), worrisome for neoplasm. This is partially obscured by streak artifact across the pelvis. Cervix is obscured. Bilateral ovaries are unremarkable. Other: No pelvic ascites. Musculoskeletal: Mild degenerative changes of the visualized thoracolumbar spine. Bilateral hip arthroplasties in satisfactory position. Streak artifact obscuring the pelvis. IMPRESSION: 6.0 x 7.4 cm infiltrating mass along the lower uterine body, worrisome for neoplasm. This is poorly evaluated on unenhanced CT and partially obscured by streak artifact. 2.3 x 3.0 cm left pelvis soft tissue implant, worrisome for nodal/peritoneal metastasis. Multifocal hepatic metastases, measuring up to 1.7 cm in segment 4B. Multifocal pulmonary metastases, measuring up to 1.3 cm at the right lung base, incompletely visualized. Electronically Signed   By: Julian Hy M.D.   On: 07/23/2015 13:59   US Biopsy  08/02/2015  INDICATION: 55 year old female with presumed metastatic disease. Patient was found have pulmonary nodules, liver lesions and a suspicious uterine lesion on recent CT examination. Tissue diagnosis is needed. EXAM: ULTRASOUND-GUIDED LIVER LESION BIOPSY MEDICATIONS: None. ANESTHESIA/SEDATION: Moderate (conscious) sedation was employed during this procedure. A total of Versed 2.5 mg and Fentanyl 75 mcg was administered intravenously. Moderate Sedation Time: 22 minutes. The patient's level of consciousness and vital signs were monitored continuously by radiology nursing throughout the procedure under my direct supervision. FLUOROSCOPY TIME:  None COMPLICATIONS: None immediate. PROCEDURE: Informed written consent was obtained from the patient after a thorough discussion of the procedural risks, benefits and alternatives. A timeout was performed prior to the initiation of the procedure. Liver was evaluated with ultrasound.  Few scattered heterogeneous lesions identified throughout the liver. A lesion in the left hepatic lobe between the medial and lateral segments was felt to be the most accessible for biopsy. The anterior and right side abdomen were prepped with chlorhexidine. Sterile field was created. Skin and soft tissues were anesthetized with 1% lidocaine. Using ultrasound guidance, 17 gauge needle was directed into this liver lesion. Needle position was confirmed within the lesion. Three core biopsies obtained with an 18 gauge core device. Specimens placed in formalin. 17 gauge needle removed without complication. Bandage placed over the puncture site. FINDINGS: Heterogeneous lesions scattered throughout the left and right hepatic lobes. A lesion in the left hepatic lobe near the junction of the medial and lateral segments was biopsied. Needle position confirmed within this lesion. No significant bleeding following the core biopsies. IMPRESSION: Ultrasound-guided core biopsies of a left hepatic lesion. Electronically Signed   By: Markus Daft M.D.   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 Stage IV- reviewed with the patient and family at length. Unfortunately final histology still not available.   # Discussed with the patient and family that- as the final histology is still not available- difficult for me to give the treatment options at this time. Verbal report from Dr. Georgia Dom either leiomyosarcoma versus PECOMA. Treatment options/response to treatment/prognosis would depend Upon histology.  # Patient will likely need a port; also chemotherapy education once more information available.  # Chronic pain- currently managed by Dr.Crisp- no clear evidence of malignancy causing the pain at this time.   # For now recommend a CT of the chest; await the results of the histology- will give  family call when available- at [phone] Almond.  The above plan of care was discussed with Dr.Berchuck.   Thank you Dr. Fransisca Connors for the referral and allowing me to  participate in the care of your pleasant patient.    All questions were answered. The patient knows to call the clinic with any problems, questions or concerns. Patient follow-up with me in approximately 10 days or so to review the plan of care.    Cammie Sickle, MD 08/19/2015 7:54 AM

## 2015-08-19 ENCOUNTER — Telehealth: Payer: Self-pay

## 2015-08-19 ENCOUNTER — Telehealth: Payer: Self-pay | Admitting: Internal Medicine

## 2015-08-19 NOTE — Telephone Encounter (Signed)
Patients relative wanted to know pathology report and treatment plan

## 2015-08-19 NOTE — Telephone Encounter (Signed)
Left a message for the patient's family at [phone]/[place]- to call us back regarding the biopsy results. Dr.B

## 2015-08-19 NOTE — Telephone Encounter (Signed)
Pt says she is in a lot of pain and would like to get her prescriptions early.

## 2015-08-19 NOTE — Progress Notes (Signed)
  Oncology Nurse Navigator Documentation  Navigator Location: [facility] (08/19/15 1000) Navigator Encounter Type: Diagnostic Results (08/19/15 1000)                                          Time Spent with Patient: 15 (08/19/15 1000)   Final pathology has returned. Dr Rogue Bussing made aware and results provided.

## 2015-08-20 ENCOUNTER — Telehealth: Payer: Self-pay

## 2015-08-20 NOTE — Telephone Encounter (Signed)
Left message for patient to call office.  

## 2015-08-20 NOTE — Telephone Encounter (Signed)
Returning missed call from Dr. Rogue Bussing

## 2015-08-21 NOTE — Telephone Encounter (Signed)
Nurses and Secretaries,  [place] and I discussed on Friday, 08/20/2015, the patient's request for early refill of her medication As I informed Kori, I prefer to avoid early refill for the patient at this time. Please inform patient that I prefer to avoid an early refill of her medications at this time  Thank you

## 2015-08-23 ENCOUNTER — Encounter: Payer: Self-pay | Admitting: Emergency Medicine

## 2015-08-23 ENCOUNTER — Telehealth: Payer: Self-pay | Admitting: Pain Medicine

## 2015-08-23 ENCOUNTER — Emergency Department: Payer: Medicare Other

## 2015-08-23 ENCOUNTER — Telehealth: Payer: Self-pay | Admitting: *Deleted

## 2015-08-23 ENCOUNTER — Observation Stay
Admission: EM | Admit: 2015-08-23 | Discharge: 2015-08-28 | Disposition: A | Discharge status: Home / Self Care | Payer: Medicare Other | Attending: Internal Medicine | Admitting: Internal Medicine

## 2015-08-23 DIAGNOSIS — Z6841 Body Mass Index (BMI) 40.0 and over, adult: Secondary | ICD-10-CM | POA: Diagnosis not present

## 2015-08-23 DIAGNOSIS — G4700 Insomnia, unspecified: Secondary | ICD-10-CM | POA: Diagnosis not present

## 2015-08-23 DIAGNOSIS — E669 Obesity, unspecified: Secondary | ICD-10-CM | POA: Diagnosis not present

## 2015-08-23 DIAGNOSIS — M549 Dorsalgia, unspecified: Secondary | ICD-10-CM | POA: Insufficient documentation

## 2015-08-23 DIAGNOSIS — M5481 Occipital neuralgia: Secondary | ICD-10-CM | POA: Insufficient documentation

## 2015-08-23 DIAGNOSIS — L989 Disorder of the skin and subcutaneous tissue, unspecified: Secondary | ICD-10-CM

## 2015-08-23 DIAGNOSIS — C7801 Secondary malignant neoplasm of right lung: Secondary | ICD-10-CM | POA: Insufficient documentation

## 2015-08-23 DIAGNOSIS — Z818 Family history of other mental and behavioral disorders: Secondary | ICD-10-CM | POA: Insufficient documentation

## 2015-08-23 DIAGNOSIS — F329 Major depressive disorder, single episode, unspecified: Secondary | ICD-10-CM | POA: Insufficient documentation

## 2015-08-23 DIAGNOSIS — F17210 Nicotine dependence, cigarettes, uncomplicated: Secondary | ICD-10-CM | POA: Insufficient documentation

## 2015-08-23 DIAGNOSIS — N289 Disorder of kidney and ureter, unspecified: Secondary | ICD-10-CM | POA: Diagnosis not present

## 2015-08-23 DIAGNOSIS — C801 Malignant (primary) neoplasm, unspecified: Secondary | ICD-10-CM

## 2015-08-23 DIAGNOSIS — E785 Hyperlipidemia, unspecified: Secondary | ICD-10-CM | POA: Diagnosis present

## 2015-08-23 DIAGNOSIS — R918 Other nonspecific abnormal finding of lung field: Secondary | ICD-10-CM

## 2015-08-23 DIAGNOSIS — R410 Disorientation, unspecified: Secondary | ICD-10-CM | POA: Diagnosis not present

## 2015-08-23 DIAGNOSIS — Z7982 Long term (current) use of aspirin: Secondary | ICD-10-CM | POA: Insufficient documentation

## 2015-08-23 DIAGNOSIS — R52 Pain, unspecified: Secondary | ICD-10-CM

## 2015-08-23 DIAGNOSIS — J45909 Unspecified asthma, uncomplicated: Secondary | ICD-10-CM | POA: Insufficient documentation

## 2015-08-23 DIAGNOSIS — Z811 Family history of alcohol abuse and dependence: Secondary | ICD-10-CM | POA: Insufficient documentation

## 2015-08-23 DIAGNOSIS — G893 Neoplasm related pain (acute) (chronic): Principal | ICD-10-CM | POA: Insufficient documentation

## 2015-08-23 DIAGNOSIS — R1084 Generalized abdominal pain: Secondary | ICD-10-CM | POA: Diagnosis present

## 2015-08-23 DIAGNOSIS — E1140 Type 2 diabetes mellitus with diabetic neuropathy, unspecified: Secondary | ICD-10-CM | POA: Insufficient documentation

## 2015-08-23 DIAGNOSIS — Z79899 Other long term (current) drug therapy: Secondary | ICD-10-CM | POA: Insufficient documentation

## 2015-08-23 DIAGNOSIS — Z8249 Family history of ischemic heart disease and other diseases of the circulatory system: Secondary | ICD-10-CM | POA: Insufficient documentation

## 2015-08-23 DIAGNOSIS — C548 Malignant neoplasm of overlapping sites of corpus uteri: Secondary | ICD-10-CM | POA: Diagnosis not present

## 2015-08-23 DIAGNOSIS — B351 Tinea unguium: Secondary | ICD-10-CM | POA: Diagnosis not present

## 2015-08-23 DIAGNOSIS — R109 Unspecified abdominal pain: Secondary | ICD-10-CM | POA: Insufficient documentation

## 2015-08-23 DIAGNOSIS — C787 Secondary malignant neoplasm of liver and intrahepatic bile duct: Secondary | ICD-10-CM | POA: Diagnosis not present

## 2015-08-23 DIAGNOSIS — I10 Essential (primary) hypertension: Secondary | ICD-10-CM | POA: Diagnosis present

## 2015-08-23 DIAGNOSIS — M5136 Other intervertebral disc degeneration, lumbar region: Secondary | ICD-10-CM | POA: Diagnosis not present

## 2015-08-23 DIAGNOSIS — M5030 Other cervical disc degeneration, unspecified cervical region: Secondary | ICD-10-CM | POA: Insufficient documentation

## 2015-08-23 DIAGNOSIS — Z823 Family history of stroke: Secondary | ICD-10-CM | POA: Insufficient documentation

## 2015-08-23 DIAGNOSIS — K5900 Constipation, unspecified: Secondary | ICD-10-CM | POA: Insufficient documentation

## 2015-08-23 DIAGNOSIS — Z888 Allergy status to other drugs, medicaments and biological substances status: Secondary | ICD-10-CM | POA: Insufficient documentation

## 2015-08-23 DIAGNOSIS — Z794 Long term (current) use of insulin: Secondary | ICD-10-CM | POA: Insufficient documentation

## 2015-08-23 DIAGNOSIS — R079 Chest pain, unspecified: Secondary | ICD-10-CM

## 2015-08-23 DIAGNOSIS — C799 Secondary malignant neoplasm of unspecified site: Secondary | ICD-10-CM

## 2015-08-23 DIAGNOSIS — D259 Leiomyoma of uterus, unspecified: Secondary | ICD-10-CM | POA: Insufficient documentation

## 2015-08-23 DIAGNOSIS — N644 Mastodynia: Secondary | ICD-10-CM | POA: Insufficient documentation

## 2015-08-23 DIAGNOSIS — Z91041 Radiographic dye allergy status: Secondary | ICD-10-CM | POA: Insufficient documentation

## 2015-08-23 DIAGNOSIS — Z882 Allergy status to sulfonamides status: Secondary | ICD-10-CM | POA: Insufficient documentation

## 2015-08-23 DIAGNOSIS — C7802 Secondary malignant neoplasm of left lung: Secondary | ICD-10-CM | POA: Insufficient documentation

## 2015-08-23 DIAGNOSIS — Z96643 Presence of artificial hip joint, bilateral: Secondary | ICD-10-CM | POA: Insufficient documentation

## 2015-08-23 DIAGNOSIS — I700 Atherosclerosis of aorta: Secondary | ICD-10-CM | POA: Insufficient documentation

## 2015-08-23 LAB — CBC
HEMATOCRIT: 41.3 % (ref 35.0–47.0)
HEMOGLOBIN: 13.7 g/dL (ref 12.0–16.0)
MCH: 29.8 pg (ref 26.0–34.0)
MCHC: 33.2 g/dL (ref 32.0–36.0)
MCV: 89.8 fL (ref 80.0–100.0)
Platelets: 144 10*3/uL — ABNORMAL LOW (ref 150–440)
RBC: 4.6 MIL/uL (ref 3.80–5.20)
RDW: 12.7 % (ref 11.5–14.5)
WBC: 12.1 10*3/uL — ABNORMAL HIGH (ref 3.6–11.0)

## 2015-08-23 LAB — COMPREHENSIVE METABOLIC PANEL
ALBUMIN: 4 g/dL (ref 3.5–5.0)
ALK PHOS: 81 U/L (ref 38–126)
ALT: 27 U/L (ref 14–54)
ANION GAP: 10 (ref 5–15)
AST: 35 U/L (ref 15–41)
BUN: 10 mg/dL (ref 6–20)
CALCIUM: 9.1 mg/dL (ref 8.9–10.3)
CHLORIDE: 100 mmol/L — AB (ref 101–111)
CO2: 28 mmol/L (ref 22–32)
Creatinine, Ser: 0.88 mg/dL (ref 0.44–1.00)
GFR calc Af Amer: >60 mL/min (ref >60)
GFR calc non Af Amer: >60 mL/min (ref >60)
GLUCOSE: 115 mg/dL — AB (ref 65–99)
Potassium: 3.4 mmol/L — ABNORMAL LOW (ref 3.5–5.1)
Sodium: 138 mmol/L (ref 135–145)
Total Protein: 8 g/dL (ref 6.5–8.1)

## 2015-08-23 LAB — URINALYSIS COMPLETE WITH MICROSCOPIC (ARMC ONLY)
Bilirubin Urine: NEGATIVE
Glucose, UA: NEGATIVE mg/dL
Hgb urine dipstick: NEGATIVE
KETONES UR: NEGATIVE mg/dL
Leukocytes, UA: NEGATIVE
Nitrite: NEGATIVE
PH: 5 (ref 5.0–8.0)
PROTEIN: NEGATIVE mg/dL
Specific Gravity, Urine: 1.008 (ref 1.005–1.030)

## 2015-08-23 LAB — TROPONIN I

## 2015-08-23 LAB — POCT PREGNANCY, URINE: Preg Test, Ur: NEGATIVE

## 2015-08-23 LAB — LIPASE, BLOOD: Lipase: 24 U/L (ref 11–51)

## 2015-08-23 MED ORDER — ONDANSETRON HCL 4 MG/2ML IJ SOLN
4.0000 mg | Freq: Once | INTRAMUSCULAR | Status: AC
  Administered 2015-08-23: 4 mg via INTRAVENOUS
  Filled 2015-08-23: qty 2

## 2015-08-23 MED ORDER — HYDROMORPHONE HCL 1 MG/ML IJ SOLN
INTRAMUSCULAR | Status: AC
  Administered 2015-08-23: 1 mg via INTRAVENOUS
  Filled 2015-08-23: qty 1

## 2015-08-23 MED ORDER — BARIUM SULFATE 2.1 % PO SUSP
900.0000 mL | Freq: Once | ORAL | Status: AC
  Administered 2015-08-23: 900 mL via ORAL

## 2015-08-23 MED ORDER — TECHNETIUM TO 99M ALBUMIN AGGREGATED
4.0000 | Freq: Once | INTRAVENOUS | Status: AC | PRN
  Administered 2015-08-23: 3.881 via INTRAVENOUS

## 2015-08-23 MED ORDER — MORPHINE SULFATE (PF) 4 MG/ML IV SOLN
4.0000 mg | Freq: Once | INTRAVENOUS | Status: AC
  Administered 2015-08-23: 4 mg via INTRAVENOUS

## 2015-08-23 MED ORDER — OXYCODONE HCL 5 MG PO TABS: 5.0000 mg | ORAL_TABLET | Freq: Four times a day (QID) | ORAL | Status: DC | PRN

## 2015-08-23 MED ORDER — TECHNETIUM TC 99M DIETHYLENETRIAME-PENTAACETIC ACID
30.0000 | Freq: Once | INTRAVENOUS | Status: AC | PRN
Start: 2015-08-23 — End: 2015-08-23
  Administered 2015-08-23: 32.103 via INTRAVENOUS

## 2015-08-23 MED ORDER — SODIUM CHLORIDE 0.9 % IV BOLUS (SEPSIS)
500.0000 mL | Freq: Once | INTRAVENOUS | Status: AC
  Administered 2015-08-23: 500 mL via INTRAVENOUS

## 2015-08-23 MED ORDER — HYDROMORPHONE HCL 1 MG/ML IJ SOLN
1.0000 mg | Freq: Once | INTRAMUSCULAR | Status: AC
  Administered 2015-08-23: 1 mg via INTRAVENOUS

## 2015-08-23 MED ORDER — MORPHINE SULFATE (PF) 4 MG/ML IV SOLN
4.0000 mg | Freq: Once | INTRAVENOUS | Status: AC
  Administered 2015-08-23: 4 mg via INTRAVENOUS
  Filled 2015-08-23: qty 1

## 2015-08-23 NOTE — Telephone Encounter (Signed)
Voicemail left with patient, returning her call.

## 2015-08-23 NOTE — Telephone Encounter (Signed)
Voicemail left for patient, returning her call.

## 2015-08-23 NOTE — ED Notes (Signed)
Pt reports upper and lower abdominal pain; reports was dx with uterine cancer last week, seen at [facility] and had liver biopsy there. Pt reports some constipation but has been taking sennokot and drank prune juice today. Pt reports 2 small bowel movements but pain continues.

## 2015-08-23 NOTE — ED Provider Notes (Signed)
[facility] Emergency Department Provider Note   ____________________________________________  Time seen: Approximately 4:36 PM  I have reviewed the triage vital signs and the nursing notes.   HISTORY  Chief Complaint Abdominal Pain    HPI Annette [name] is a 55 y.o. 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 Patient has also had 2 days of constant nonexertional right upper chest pain. No shortness of breath. No vomiting or diarrhea, no fevers or chills per she has had some constipation.She reports that she is prescribed hydrocodone for pain but her pain has been so severe that she has run out of those prescribed medications. She reports she had a liver biopsy performed at [facility] last week.   Past Medical History  Diagnosis Date  . Hypertension   . Depression   . Peripheral neuropathy ([place])   . Renal insufficiency   . Diabetes mellitus without complication ([place])   . Onychomycosis   . Obesity   . DDD (degenerative disc disease), lumbar   . Menopausal syndrome   . Asthma   . Back pain   . Hyperlipidemia   . Insomnia     Patient Active Problem List   Diagnosis Date Noted  . Cancer of uterine body ([place]) 08/18/2015  . Malignant neoplasm of overlapping sites of body of uterus ([place]) 08/18/2015  . Uterine mass 07/28/2015  . 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 DDD (degenerative disc disease), lumbar 06/24/2014  . Facet syndrome, lumbar 06/24/2014  . Neuropathy due to secondary diabetes ([place]) 06/24/2014  . DDD (degenerative disc disease), cervical 06/24/2014  . Occipital neuralgia 06/24/2014  . LBP (low back pain) 06/24/2014  . Degeneration of intervertebral disc of cervical  region 06/24/2014  . Degeneration of intervertebral disc of lumbar region 06/24/2014  . Diabetes mellitus ([place]) 06/24/2014  . Large breasts 05/30/2012    Past Surgical History  Procedure Laterality Date  . Reduction mammaplasty Bilateral 12/15/2013  . Joint replacement Bilateral [id number]  . Shoulder surgery Left   . Cosmetic surgery    . Liver biopsy      Current Outpatient Rx  Name  Route  Sig  Dispense  Refill  . albuterol (PROAIR HFA) 108 (90 Base) MCG/ACT inhaler   Inhalation   Inhale into the lungs.         . ALPRAZolam (XANAX) 1 MG tablet               . aspirin EC 81 MG tablet   Oral   Take 81 mg by mouth.         . beclomethasone (QVAR) 40 MCG/ACT inhaler   Inhalation   Inhale 2 puffs into the lungs 2 (two) times daily. Reported on 05/26/2015         . clonazePAM (KLONOPIN) 1 MG tablet      Reported on 08/18/2015         . conjugated estrogens (PREMARIN) vaginal cream   Vaginal   Place vaginally.         . fluticasone (FLONASE) 50 MCG/ACT nasal spray               . gabapentin (NEURONTIN) 600 MG tablet      2 (two) times daily.          Marland Kitchen  hydrochlorothiazide (HYDRODIURIL) 25 MG tablet   Oral   Take by mouth.         Marland Kitchen HYDROcodone-acetaminophen (NORCO/VICODIN) 5-325 MG tablet      Reported on 08/03/2015         . ibuprofen (ADVIL,MOTRIN) 800 MG tablet   Oral   Take 800 mg by mouth 2 (two) times daily. Reported on 08/03/2015         . lactulose (CHRONULAC) 10 GM/15ML solution      Reported on 08/02/2015         . lisinopril (PRINIVIL,ZESTRIL) 10 MG tablet   Oral   Take by mouth.         Marland Kitchen lisinopril-hydrochlorothiazide (PRINZIDE,ZESTORETIC) 10-12.5 MG tablet               . metFORMIN (GLUCOPHAGE) 500 MG tablet   Oral   Take 500 mg by mouth daily.         . naloxone [facility]) 2 MG/2ML injection               . ondansetron (ZOFRAN) 4 MG tablet   Oral   Take 4 mg by mouth. Reported on 08/02/2015           . oxyCODONE (ROXICODONE) 5 MG immediate release tablet   Oral   Take 1 tablet (5 mg total) by mouth every 6 (six) hours as needed for breakthrough pain. Do not drive while taking this medication.   8 tablet   0   . Oxycodone HCl 10 MG TABS      Limit 1 tablet  by mouth per day or 2-4 times per day for breakthrough pain while taking Nucynta ER if tolerated   120 tablet   0   . pantoprazole (PROTONIX) 40 MG tablet               . polyethylene glycol powder (GLYCOLAX/MIRALAX) powder               . pravastatin (PRAVACHOL) 40 MG tablet   Oral   Take 40 mg by mouth daily.          . pravastatin (PRAVACHOL) 40 MG tablet   Oral   Take by mouth.         Marland Kitchen PREMARIN vaginal cream   Vaginal   Place 1 Applicatorful vaginally at bedtime.            Dispense as written.   . Tapentadol HCl (NUCYNTA ER) 150 MG TB12      Limit 1 tab by mouth  every 12 hours IF tolerated (note pill is now 150 mg and much stronger)   60 tablet   0   . tiZANidine (ZANAFLEX) 2 MG tablet               . traZODone (DESYREL) 150 MG tablet   Oral   Take by mouth.         . Vilazodone HCl (VIIBRYD) 40 MG TABS                 Allergies Iodides; Iodine; Shellfish allergy; Shellfish-derived products; Iodinated diagnostic agents; Povidone iodine; and Povidone-iodine  Family History  Problem Relation Age of Onset  . COPD Mother   . Sleep apnea Mother   . Alcohol abuse Mother   . Depression Mother   . Stroke Mother   . Heart disease Father   . Heart disease Sister     Social History Social History  Substance Use Topics  .  Smoking status: Current Every Day Smoker -- 0.20 packs/day    Types: Cigarettes  . Smokeless tobacco: None  . Alcohol Use: No    Review of Systems Constitutional: No fever/chills Eyes: No visual changes. ENT: No sore throat. Cardiovascular: + chest pain. Respiratory: Denies shortness of breath. Gastrointestinal: + abdominal pain.  No nausea, no  vomiting.  No diarrhea.  + constipation. Genitourinary: Negative for dysuria. Musculoskeletal: Negative for back pain. Skin: Negative for rash. Neurological: Negative for headaches, focal weakness or numbness.  10-point ROS otherwise negative.  ____________________________________________   PHYSICAL EXAM:  VITAL SIGNS: ED Triage Vitals  Enc Vitals Group     BP 08/23/15 1512 130/72 mmHg     Pulse Rate 08/23/15 1512 85     Resp 08/23/15 1512 16     Temp 08/23/15 1512 98.2 F (36.8 C)     Temp src --      SpO2 08/23/15 1512 99 %     Weight 08/23/15 1512 286 lb (129.729 kg)     Height 08/23/15 1512 5\' 5"  (1.651 m)     Head Cir --      Peak Flow --      Pain Score 08/23/15 1512 8     Pain Loc --      Pain Edu? --      Excl. in [place]? --     Constitutional: Alert and oriented. Nontoxic-appearing and in no acute distress. Eyes: Conjunctivae are normal. PERRL. EOMI. Head: Atraumatic. Nose: No congestion/rhinnorhea. Mouth/Throat: Mucous membranes are moist.  Oropharynx non-erythematous. Neck: No stridor.  Supple without meningismus. Cardiovascular: Normal rate, regular rhythm. Grossly normal heart sounds.  Good peripheral circulation. Respiratory: Normal respiratory effort.  No retractions. Lungs CTAB. Gastrointestinal: Soft with moderate diffuse tenderness, worse in the lower abdomen.Marland Kitchen No CVA tenderness. Genitourinary: deferred Musculoskeletal: No lower extremity tenderness nor edema.  No joint effusions. Tenderness to palpation throughout the right upper chest wall. Neurologic:  Normal speech and language. No gross focal neurologic deficits are appreciated. No gait instability. Skin:  Skin is warm, dry and intact. No rash noted. Psychiatric: Mood and affect are normal. Speech and behavior are normal.  ____________________________________________   LABS (all labs ordered are listed, but only abnormal results are displayed)  Labs Reviewed  COMPREHENSIVE METABOLIC PANEL -  Abnormal; Notable for the following:    Potassium 3.4 (*)    Chloride 100 (*)    Glucose, Bld 115 (*)    Total Bilirubin <0.1 (*)    All other components within normal limits  CBC - Abnormal; Notable for the following:    WBC 12.1 (*)    Platelets 144 (*)    All other components within normal limits  URINALYSIS COMPLETEWITH MICROSCOPIC ([facility] ONLY) - Abnormal; Notable for the following:    Color, Urine STRAW (*)    APPearance CLEAR (*)    Bacteria, UA RARE (*)    Squamous Epithelial / LPF 0-5 (*)    All other components within normal limits  LIPASE, BLOOD  TROPONIN I  POCT PREGNANCY, URINE  POC URINE PREG, ED   ____________________________________________  EKG  ED ECG REPORT I, Joanne Gavel, the attending physician, personally viewed and interpreted this ECG.   Date: 08/23/2015  EKG Time: 17:38  Rate: 77  Rhythm: normal sinus rhythm  Axis: normal  Intervals:none  ST&T Change: No acute ST elevation or acute ST depression. Nonspecific T-wave abnormality in the inferior and lateral leads. EKG is unchanged from 02/28/13.  ____________________________________________  RADIOLOGY  CT abdomen and Pelvis  CXR  V/Q scan ____________________________________________   PROCEDURES  Procedure(s) performed: None  Procedures  Critical Care performed: No  ____________________________________________   INITIAL IMPRESSION / ASSESSMENT AND PLAN / ED COURSE  Pertinent labs & imaging results that were available during my care of the patient were reviewed by me and considered in my medical decision making (see chart for details).  Annette [name] is a 55 y.o. 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 On exam, she is nontoxic appearing and in no acute distress. Her  vital signs are stable, she is afebrile. She does have tenderness to palpation in the right upper chest wall, EKG is unchanged from prior however will obtain a troponin. Given her sink cancer diagnosis and new chest pain, she will require a VQ scan for rule out PE. She has an anaphylactic allergy to IV contrast so I cannot obtain a contrasted CTA. Her abdomen is soft with mild diffuse tenderness. We'll obtain screening labs, treat her pain and also obtain CT of the abdomen and pelvis with by mouth contrast, especially given her recent liver biopsy and right-sided pain, would like to rule out a complication of this procedure. Reassess for disposition.  ----------------------------------------- 8:56 PM on 08/23/2015 ----------------------------------------- I reviewed the patient's labs. CBC is notable for mild leukocytosis, white blood cell count is 12.1. Unremarkable CMP and lipase, negative troponin. Urine pregnancy test is negative. Urinalysis is not consistent with infection. Chest x-ray shows small pulmonary nodules in the right lung with possibly surrounding interstitial edema, this was also noted on the CT scan of the abdomen and pelvis obtained last month. He scan of the abdomen and pelvis today shows interval progression of the lung and liver metastases as well as enlargement of the pelvic notable metastasis, similar appearance of the infiltrative lower uterine mass. Awaiting VQ scan. Care transferred to Dr. Reita Cliche at this time pending VQ scan as well as final disposition.  ____________________________________________   FINAL CLINICAL IMPRESSION(S) / ED DIAGNOSES  Final diagnoses:  Pain  Generalized abdominal pain  Chest pain, unspecified chest pain type      NEW MEDICATIONS STARTED DURING THIS VISIT:  New Prescriptions   OXYCODONE (ROXICODONE) 5 MG IMMEDIATE RELEASE TABLET    Take 1 tablet (5 mg total) by mouth every 6 (six) hours as needed for breakthrough pain. Do not drive while  taking this medication.     Note:  This document was prepared using Dragon voice recognition software and may include unintentional dictation errors.    Joanne Gavel, MD 08/23/15 2058

## 2015-08-23 NOTE — Telephone Encounter (Signed)
Patient informed that there will be no early refills per Dr. Primus Bravo. Patient asked what she is supposed to do since she is in a lot of pain. Asked Dr. Primus Bravo, instructed to have patient see Dr. Kasandra Knudsen. Patient informed of this and hung up the phone.

## 2015-08-23 NOTE — ED Provider Notes (Signed)
This patient was signed out to me awaiting VQ scan.  She is newly diagnosed with metastatic uterine cancer and is here with worsening diffuse pain especially in the abdomen.  Her CT showed worsening of the metastasis. Next  Patient has required numerous doses of morphine and Dilaudid. I don't think that she is able to go home in terms of needing IV pain management.  Patient is still awaiting the VQ scan. I have discussed with hospitalist Dr. Jannifer Franklin for admission.  She was supposed to see the oncologist on Thursday, but perhaps she will go to see oncology in the hospital as she's had the CT scans overnight tonight.    Lisa Roca, MD 08/23/15 2218

## 2015-08-23 NOTE — Telephone Encounter (Signed)
Patient would like to speak with nurse °

## 2015-08-23 NOTE — ED Notes (Signed)
Patient transported to area for VQ scan

## 2015-08-24 ENCOUNTER — Encounter: Payer: Self-pay | Admitting: *Deleted

## 2015-08-24 DIAGNOSIS — G893 Neoplasm related pain (acute) (chronic): Secondary | ICD-10-CM | POA: Diagnosis not present

## 2015-08-24 DIAGNOSIS — L989 Disorder of the skin and subcutaneous tissue, unspecified: Secondary | ICD-10-CM

## 2015-08-24 LAB — BASIC METABOLIC PANEL
ANION GAP: 6 (ref 5–15)
BUN: 10 mg/dL (ref 6–20)
CHLORIDE: 104 mmol/L (ref 101–111)
CO2: 30 mmol/L (ref 22–32)
Calcium: 8.6 mg/dL — ABNORMAL LOW (ref 8.9–10.3)
Creatinine, Ser: 0.87 mg/dL (ref 0.44–1.00)
GFR calc Af Amer: >60 mL/min (ref >60)
GLUCOSE: 144 mg/dL — AB (ref 65–99)
POTASSIUM: 3.5 mmol/L (ref 3.5–5.1)
Sodium: 140 mmol/L (ref 135–145)

## 2015-08-24 LAB — CBC
HEMATOCRIT: 37 % (ref 35.0–47.0)
HEMOGLOBIN: 12.4 g/dL (ref 12.0–16.0)
MCH: 30.1 pg (ref 26.0–34.0)
MCHC: 33.6 g/dL (ref 32.0–36.0)
MCV: 89.8 fL (ref 80.0–100.0)
Platelets: 131 10*3/uL — ABNORMAL LOW (ref 150–440)
RBC: 4.12 MIL/uL (ref 3.80–5.20)
RDW: 13.2 % (ref 11.5–14.5)
WBC: 9.1 10*3/uL (ref 3.6–11.0)

## 2015-08-24 LAB — HEMOGLOBIN A1C: HEMOGLOBIN A1C: 6.6 % — AB (ref 4.0–6.0)

## 2015-08-24 LAB — TROPONIN I: Troponin I: <0.03 ng/mL (ref <0.03)

## 2015-08-24 LAB — SURGICAL PATHOLOGY

## 2015-08-24 LAB — GLUCOSE, CAPILLARY
GLUCOSE-CAPILLARY: 152 mg/dL — AB (ref 65–99)
Glucose-Capillary: 123 mg/dL — ABNORMAL HIGH (ref 65–99)
Glucose-Capillary: 131 mg/dL — ABNORMAL HIGH (ref 65–99)

## 2015-08-24 MED ORDER — OXYCODONE HCL ER 15 MG PO T12A
15.0000 mg | EXTENDED_RELEASE_TABLET | Freq: Two times a day (BID) | ORAL | Status: DC
  Administered 2015-08-24 (×2): 15 mg via ORAL
  Filled 2015-08-24 (×2): qty 1

## 2015-08-24 MED ORDER — ENOXAPARIN SODIUM 40 MG/0.4ML ~~LOC~~ SOLN
40.0000 mg | Freq: Two times a day (BID) | SUBCUTANEOUS | Status: DC
  Administered 2015-08-24 – 2015-08-28 (×8): 40 mg via SUBCUTANEOUS
  Filled 2015-08-24 (×9): qty 0.4

## 2015-08-24 MED ORDER — ACETAMINOPHEN 650 MG RE SUPP
650.0000 mg | Freq: Four times a day (QID) | RECTAL | Status: DC | PRN
Start: 2015-08-24 — End: 2015-08-28

## 2015-08-24 MED ORDER — PRAVASTATIN SODIUM 40 MG PO TABS
40.0000 mg | ORAL_TABLET | Freq: Every day | ORAL | Status: DC
  Administered 2015-08-24 – 2015-08-28 (×5): 40 mg via ORAL
  Filled 2015-08-24 (×6): qty 1

## 2015-08-24 MED ORDER — INSULIN ASPART 100 UNIT/ML ~~LOC~~ SOLN: 0.0000 [IU] | Freq: Every day | SUBCUTANEOUS | Status: DC

## 2015-08-24 MED ORDER — ONDANSETRON HCL 4 MG/2ML IJ SOLN: 4.0000 mg | Freq: Four times a day (QID) | INTRAMUSCULAR | Status: DC | PRN

## 2015-08-24 MED ORDER — OXYCODONE HCL 5 MG PO TABS
10.0000 mg | ORAL_TABLET | ORAL | Status: DC | PRN
  Administered 2015-08-24 – 2015-08-25 (×7): 10 mg via ORAL
  Filled 2015-08-24 (×7): qty 2

## 2015-08-24 MED ORDER — LISINOPRIL 10 MG PO TABS
10.0000 mg | ORAL_TABLET | Freq: Every day | ORAL | Status: DC
  Administered 2015-08-24 – 2015-08-28 (×5): 10 mg via ORAL
  Filled 2015-08-24 (×5): qty 1

## 2015-08-24 MED ORDER — ASPIRIN EC 81 MG PO TBEC
81.0000 mg | DELAYED_RELEASE_TABLET | Freq: Every day | ORAL | Status: DC
  Administered 2015-08-24 – 2015-08-28 (×5): 81 mg via ORAL
  Filled 2015-08-24 (×6): qty 1

## 2015-08-24 MED ORDER — VILAZODONE HCL 20 MG PO TABS
40.0000 mg | ORAL_TABLET | Freq: Every day | ORAL | Status: DC
  Administered 2015-08-24 – 2015-08-28 (×5): 40 mg via ORAL
  Filled 2015-08-24 (×6): qty 2

## 2015-08-24 MED ORDER — TRAZODONE HCL 50 MG PO TABS
150.0000 mg | ORAL_TABLET | Freq: Two times a day (BID) | ORAL | Status: DC
  Administered 2015-08-24 – 2015-08-28 (×9): 150 mg via ORAL
  Filled 2015-08-24 (×9): qty 1

## 2015-08-24 MED ORDER — LACTULOSE 10 GM/15ML PO SOLN: 30.0000 g | Freq: Two times a day (BID) | ORAL | Status: DC | PRN

## 2015-08-24 MED ORDER — OXYCODONE HCL 5 MG PO TABS
5.0000 mg | ORAL_TABLET | ORAL | Status: DC | PRN
  Filled 2015-08-24: qty 1

## 2015-08-24 MED ORDER — GABAPENTIN 300 MG PO CAPS
600.0000 mg | ORAL_CAPSULE | Freq: Two times a day (BID) | ORAL | Status: DC
  Administered 2015-08-24 – 2015-08-28 (×9): 600 mg via ORAL
  Filled 2015-08-24 (×9): qty 2

## 2015-08-24 MED ORDER — INSULIN ASPART 100 UNIT/ML ~~LOC~~ SOLN
0.0000 [IU] | Freq: Three times a day (TID) | SUBCUTANEOUS | Status: DC
  Administered 2015-08-24: 3 [IU] via SUBCUTANEOUS
  Administered 2015-08-24 – 2015-08-26 (×4): 2 [IU] via SUBCUTANEOUS
  Administered 2015-08-27: 3 [IU] via SUBCUTANEOUS
  Administered 2015-08-27 – 2015-08-28 (×2): 2 [IU] via SUBCUTANEOUS
  Filled 2015-08-24: qty 3
  Filled 2015-08-24 (×5): qty 2
  Filled 2015-08-24: qty 3
  Filled 2015-08-24 (×2): qty 2

## 2015-08-24 MED ORDER — ACETAMINOPHEN 325 MG PO TABS
650.0000 mg | ORAL_TABLET | Freq: Four times a day (QID) | ORAL | Status: DC | PRN
Start: 2015-08-24 — End: 2015-08-28

## 2015-08-24 MED ORDER — ALBUTEROL SULFATE (2.5 MG/3ML) 0.083% IN NEBU: 3.0000 mL | INHALATION_SOLUTION | Freq: Four times a day (QID) | RESPIRATORY_TRACT | Status: DC | PRN

## 2015-08-24 MED ORDER — SODIUM CHLORIDE 0.9 % IV SOLN
INTRAVENOUS | Status: AC
  Administered 2015-08-24: 13:00:00 60 mL/h via INTRAVENOUS

## 2015-08-24 MED ORDER — HYDROMORPHONE HCL 1 MG/ML IJ SOLN
1.0000 mg | INTRAMUSCULAR | Status: DC | PRN
  Administered 2015-08-24 (×3): 1 mg via INTRAVENOUS
  Filled 2015-08-24 (×3): qty 1

## 2015-08-24 MED ORDER — FENTANYL 25 MCG/HR TD PT72: 25.0000 ug | MEDICATED_PATCH | TRANSDERMAL | Status: DC

## 2015-08-24 MED ORDER — SILVER SULFADIAZINE 1 % EX CREA
TOPICAL_CREAM | Freq: Every day | CUTANEOUS | Status: DC
  Administered 2015-08-24 – 2015-08-27 (×4): via TOPICAL
  Filled 2015-08-24 (×2): qty 85

## 2015-08-24 MED ORDER — ONDANSETRON HCL 4 MG PO TABS
4.0000 mg | ORAL_TABLET | Freq: Four times a day (QID) | ORAL | Status: DC | PRN
  Administered 2015-08-26: 4 mg via ORAL
  Filled 2015-08-24: qty 1

## 2015-08-24 MED ORDER — SENNOSIDES-DOCUSATE SODIUM 8.6-50 MG PO TABS
2.0000 | ORAL_TABLET | Freq: Every day | ORAL | Status: DC
  Administered 2015-08-24 – 2015-08-28 (×5): 2 via ORAL
  Filled 2015-08-24 (×6): qty 2

## 2015-08-24 MED ORDER — HYDROMORPHONE HCL 1 MG/ML IJ SOLN
1.0000 mg | Freq: Once | INTRAMUSCULAR | Status: AC
  Administered 2015-08-24: 1 mg via INTRAVENOUS
  Filled 2015-08-24: qty 1

## 2015-08-24 MED ORDER — PANTOPRAZOLE SODIUM 40 MG PO TBEC
40.0000 mg | DELAYED_RELEASE_TABLET | Freq: Every day | ORAL | Status: DC
  Administered 2015-08-24 – 2015-08-28 (×5): 40 mg via ORAL
  Filled 2015-08-24 (×5): qty 1
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
## 2015-08-24 NOTE — Care Management Note (Addendum)
Case Management Note  Patient Details  Name: Annette [name] MRN: [id number] Date of Birth: 04-12-1960  Subjective/Objective:        Ms Devoid and family are requesting information about community resources for cancer patients, such as help with expenses, travel etc. This Probation officer e-mailed Elease Etienne at the [facility] requesting advice and assistance for the [facility] family. Ms Ciliberto is currently pending an Oncology Consult.  This Probation officer provided Maryagnes Amos phone number to Ms Mccullum's Mother and she will call him to discuss community resources.            Action/Plan:   Expected Discharge Date:                  Expected Discharge Plan:     In-House Referral:     Discharge planning Services     Post Acute Care Choice:    Choice offered to:     DME Arranged:    DME Agency:     HH Arranged:    HH Agency:     Status of Service:     If discussed at [employer] of Stay Meetings, dates discussed:    Additional Comments:  Charde Macfarlane A, RN 08/24/2015, 2:57 PM

## 2015-08-24 NOTE — ED Notes (Signed)
Report given to RN on floor. Pt stable prior to transport. Pt taken to floor via stretcher by EDT.

## 2015-08-24 NOTE — Progress Notes (Signed)
1800 Patient  Remains sad and very superficially in conversation. Family in with patient most of the day.

## 2015-08-24 NOTE — ED Notes (Signed)
Per Dr. Jannifer Franklin pt may eat and drink. Pt provided with sandwich tray and drink. Pt has no needs or complaints at present. Is awaiting inpatient bed currently. Will continue to monitor.

## 2015-08-24 NOTE — H&P (Signed)
[place] [facility] [place] NAME: Annette [name]    MR#:  [id number]  DATE OF BIRTH:  Oct 01, 1960  DATE OF ADMISSION:  08/23/2015  PRIMARY CARE PHYSICIAN: Ellamae Sia, MD   REQUESTING/REFERRING PHYSICIAN: Reita Cliche, MD  CHIEF COMPLAINT:   Chief Complaint  Patient presents with  . Abdominal Pain    HISTORY OF PRESENT ILLNESS:  Annette [name]  is a 55 y.o. female who presents with Significant abdominal pain. 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  She had biopsy done a couple of days ago, pending results.  She was scheduled to see oncology in clinic, but came in tonight with uncontrolled abd pain.  It required multiple doses of narcotics IV to control her pain.  She had some chest pain as well, but had neg workup for ACS and PE.  Hospitalists called for admission.  PAST MEDICAL HISTORY:   Past Medical History  Diagnosis Date  . Hypertension   . Depression   . Peripheral neuropathy ([place])   . Renal insufficiency   . Diabetes mellitus without complication ([place])   . Onychomycosis   . Obesity   . DDD (degenerative disc disease), lumbar   . Menopausal syndrome   . Asthma   . Back pain   . Hyperlipidemia   . Insomnia     Several smaller masses and several other PAST SURGICAL HISTORY:   Past Surgical History  Procedure Laterality Date  . Reduction mammaplasty Bilateral 12/15/2013  . Joint replacement Bilateral [id number]  . Shoulder surgery Left   . Cosmetic surgery    . Liver biopsy      SOCIAL HISTORY:   Social History  Substance Use Topics  . Smoking status: Current Every Day Smoker -- 0.20 packs/day    Types: Cigarettes  . Smokeless tobacco: Not on file  . Alcohol Use: No    FAMILY HISTORY:   Family History  Problem Relation Age of Onset  . COPD Mother   . Sleep apnea Mother   . Alcohol abuse Mother   . Depression Mother   . Stroke Mother    . Heart disease Father   . Heart disease Sister     DRUG ALLERGIES:   Allergies  Allergen Reactions  . Iodides Swelling  . Iodine Swelling  . Shellfish Allergy Swelling    shrimp causes swelling  . Shellfish-Derived Products Swelling  . Iodinated Diagnostic Agents Swelling  . Povidone Iodine Swelling    eyes  . Povidone-Iodine Swelling    MEDICATIONS AT HOME:   Prior to Admission medications   Medication Sig Start Date End Date Taking? Authorizing Provider  albuterol (PROAIR HFA) 108 (90 Base) MCG/ACT inhaler Inhale 1-2 puffs into the lungs every 6 (six) hours as needed for wheezing.    Yes Historical Provider, MD  aspirin EC 81 MG tablet Take 81 mg by mouth.   Yes Historical Provider, MD  beclomethasone (QVAR) 40 MCG/ACT inhaler Inhale 2 puffs into the lungs 2 (two) times daily. Reported on 05/26/2015   Yes Historical Provider, MD  fluticasone (FLONASE) 50 MCG/ACT nasal spray Place 1 spray into both nostrils daily.    Yes Historical Provider, MD  gabapentin (NEURONTIN) 600 MG tablet Take 600 mg by mouth 2 (two) times daily.    Yes Historical Provider, MD  hydrochlorothiazide (HYDRODIURIL) 25 MG tablet Take 25 mg by mouth daily.    Yes Historical Provider, MD  ibuprofen (  ADVIL,MOTRIN) 800 MG tablet Take 800 mg by mouth every 8 (eight) hours as needed for mild pain or moderate pain.    Yes Historical Provider, MD  lactulose (CHRONULAC) 10 GM/15ML solution Take 30 g by mouth 2 (two) times daily as needed for mild constipation or moderate constipation.    Yes Historical Provider, MD  lisinopril (PRINIVIL,ZESTRIL) 10 MG tablet Take 10 mg by mouth daily.    Yes Historical Provider, MD  metFORMIN (GLUCOPHAGE-XR) 500 MG 24 hr tablet Take 500 mg by mouth daily with breakfast.   Yes Historical Provider, MD  naloxone (NARCAN) 0.4 MG/ML injection Inject 0.4 mg into the vein as needed.   Yes Historical Provider, MD  ondansetron (ZOFRAN) 4 MG tablet Take 4 mg by mouth every 8 (eight) hours as  needed for nausea or vomiting.    Yes Historical Provider, MD  Oxycodone HCl 10 MG TABS Limit 1 tablet  by mouth per day or 2-4 times per day for breakthrough pain while taking Nucynta ER if tolerated 08/03/15  Yes Mohammed Kindle, MD  pantoprazole (PROTONIX) 40 MG tablet Take 40 mg by mouth daily.    Yes Historical Provider, MD  pravastatin (PRAVACHOL) 40 MG tablet Take 40 mg by mouth daily.    Yes Historical Provider, MD  PREMARIN vaginal cream Place 1 Applicatorful vaginally at bedtime.    Yes Historical Provider, MD  Tapentadol HCl (NUCYNTA ER) 150 MG TB12 Limit 1 tab by mouth  every 12 hours IF tolerated (note pill is now 150 mg and much stronger) 08/03/15  Yes Mohammed Kindle, MD  tiZANidine (ZANAFLEX) 2 MG tablet Take 2 mg by mouth every 6 (six) hours as needed for muscle spasms.    Yes Historical Provider, MD  traZODone (DESYREL) 150 MG tablet Take 150 mg by mouth 2 (two) times daily.    Yes Historical Provider, MD  Vilazodone HCl (VIIBRYD) 40 MG TABS Take 40 mg by mouth daily.    Yes Historical Provider, MD  oxyCODONE (ROXICODONE) 5 MG immediate release tablet Take 1 tablet (5 mg total) by mouth every 6 (six) hours as needed for breakthrough pain. Do not drive while taking this medication. 08/23/15   Joanne Gavel, MD    REVIEW OF SYSTEMS:  Review of Systems  Constitutional: Negative for fever, chills, weight loss and malaise/fatigue.  HENT: Negative for ear pain, hearing loss and tinnitus.   Eyes: Negative for blurred vision, double vision, pain and redness.  Respiratory: Negative for cough, hemoptysis and shortness of breath.   Cardiovascular: Positive for chest pain. Negative for palpitations, orthopnea and leg swelling.  Gastrointestinal: Positive for abdominal pain. Negative for nausea, vomiting, diarrhea and constipation.  Genitourinary: Negative for dysuria, frequency and hematuria.  Musculoskeletal: Negative for back pain, joint pain and neck pain.  Skin:       Right thigh sore, No  acne or rash  Neurological: Negative for dizziness, tremors, focal weakness and weakness.  Endo/Heme/Allergies: Negative for polydipsia. Does not bruise/bleed easily.  Psychiatric/Behavioral: Negative for depression. The patient is not nervous/anxious and does not have insomnia.      VITAL SIGNS:   Filed Vitals:   08/23/15 2130 08/23/15 2145 08/23/15 2200 08/24/15 0010  BP: 165/107  148/76 137/76  Pulse: 90 90 92 89  Temp:      Resp: 14 16 12 13   Height:      Weight:      SpO2: 94% 93% 97% 97%   Wt Readings from Last 3 Encounters:  08/23/15  129.729 kg (286 lb)  08/03/15 129.729 kg (286 lb)  07/28/15 130 kg (286 lb 9.6 oz)    PHYSICAL EXAMINATION:  Physical Exam  Vitals reviewed. Constitutional: She is oriented to person, place, and time. She appears well-developed and well-nourished. No distress.  HENT:  Head: Normocephalic and atraumatic.  Mouth/Throat: Oropharynx is clear and moist.  Eyes: Conjunctivae and EOM are normal. Pupils are equal, round, and reactive to light. No scleral icterus.  Neck: Normal range of motion. Neck supple. No JVD present. No thyromegaly present.  Cardiovascular: Normal rate, regular rhythm and intact distal pulses.  Exam reveals no gallop and no friction rub.   No murmur heard. Respiratory: Effort normal and breath sounds normal. No respiratory distress. She has no wheezes. She has no rales.  GI: Soft. Bowel sounds are normal. She exhibits no distension. There is tenderness (diffuse, mild but this was after treament with narcotics).  Musculoskeletal: Normal range of motion. She exhibits no edema.  No arthritis, no gout  Lymphadenopathy:    She has no cervical adenopathy.  Neurological: She is alert and oriented to person, place, and time. No cranial nerve deficit.  No dysarthria, no aphasia  Skin: Skin is warm and dry. No rash noted. No erythema.  Right thigh sore  Psychiatric: She has a normal mood and affect. Her behavior is normal. Judgment  and thought content normal.    LABORATORY PANEL:   CBC  Recent Labs Lab 08/23/15 1519  WBC 12.1*  HGB 13.7  HCT 41.3  PLT 144*   ------------------------------------------------------------------------------------------------------------------  Chemistries   Recent Labs Lab 08/23/15 1519  NA 138  K 3.4*  CL 100*  CO2 28  GLUCOSE 115*  BUN 10  CREATININE 0.88  CALCIUM 9.1  AST 35  ALT 27  ALKPHOS 81  BILITOT <0.1*   ------------------------------------------------------------------------------------------------------------------  Cardiac Enzymes  Recent Labs Lab 08/23/15 1521  TROPONINI <0.03   ------------------------------------------------------------------------------------------------------------------  RADIOLOGY:  Ct Abdomen Pelvis Wo Contrast  08/23/2015  CLINICAL DATA:  Upper and lower abdominal pain. Recently diagnosed uterine cancer. EXAM: CT ABDOMEN AND PELVIS WITHOUT CONTRAST TECHNIQUE: Multidetector CT imaging of the abdomen and pelvis was performed following the standard protocol without IV contrast. COMPARISON:  07/23/2015 FINDINGS: Lower chest: Numerous nodules are again seen in the visualized lung bases and have increased in size, with the largest measuring 18 mm in the right lower lobe (series 4, image 18, previously 13 mm). There is no pleural effusion. Hepatobiliary: The numerous hypoattenuating liver lesions which have increased in size and number from the prior study, the largest measuring 3.1 cm in segment IV (series 2, image 18, previously 1.7 cm). Gallbladder is unremarkable. No biliary dilatation. Pancreas: Unremarkable. Spleen: Unremarkable. Adrenals/Urinary Tract: Unremarkable adrenal glands and right kidney. Slight fullness of the left renal collecting system. No significant ureteral dilatation. Bladder is decompressed. Stomach/Bowel: Oral contrast is present in the stomach and multiple loops of nondilated proximal to mid small bowel. There  is no evidence of bowel obstruction. The appendix is unremarkable. Vascular/Lymphatic: Normal caliber of the abdominal aorta. Rounded soft tissue posteriorly in the left aspect of the pelvis measures 3.2 x 3.1 cm (series 2, image 73, previously 2.3 x 3.0 cm). Reproductive: Suspected calcified fibroid in the posterior superior uterine segment is unchanged. Masslike enlargement of the uterus more inferiorly is grossly similar to the prior study, with assessment partially limited by streak artifact from bilateral hip arthroplasties. Other: No free fluid. Musculoskeletal: Bilateral hip arthroplasties. No suspicious lytic or blastic osseous lesion  identified. IMPRESSION: 1. Interval progression of lung and liver metastases. 2. Enlargement of left pelvic nodal metastasis/ peritoneal implant. 3. Grossly similar appearance of infiltrative lower uterine mass. Electronically Signed   By: Logan Bores M.D.   On: 08/23/2015 20:40   Dg Chest 2 View  08/23/2015  CLINICAL DATA:  Pain under right breast and in right arm with shortness of breath beginning this morning. EXAM: CHEST  2 VIEW COMPARISON:  Chest x-ray dated 12/07/2011. FINDINGS: Heart size is normal. Atherosclerotic changes noted at the aortic arch. Ill-defined nodularity is seen within the right lower lung, largest within the lateral aspects of the right lower lung. Left lung is relatively clear. No pleural effusion or pneumothorax seen. Osseous structures about the chest are unremarkable. IMPRESSION: Probable small pulmonary nodules within the right lower lung, perhaps with surrounding interstitial edema. Recommend chest CT for further characterization. Aortic atherosclerosis. Electronically Signed   By: Franki Cabot M.D.   On: 08/23/2015 18:56   Nm Pulmonary Perf And Vent  08/23/2015  CLINICAL DATA:  Pain. EXAM: NUCLEAR MEDICINE VENTILATION - PERFUSION LUNG SCAN TECHNIQUE: Ventilation images were obtained in multiple projections using inhaled aerosol Tc-42m  DTPA. Perfusion images were obtained in multiple projections after intravenous injection of Tc-65m MAA. RADIOPHARMACEUTICALS:  32.1 mCi Technetium-23m DTPA aerosol inhalation and 3.88 mCi Technetium-78m MAA IV COMPARISON:  Chest x-ray from earlier today FINDINGS: Ventilation: No focal ventilation defect. Perfusion: No wedge shaped peripheral perfusion defects to suggest acute pulmonary embolism. IMPRESSION: No evidence of pulmonary emboli.  Very low probability V/Q scan. Electronically Signed   By: Dorise Bullion III M.D   On: 08/23/2015 23:55    EKG:   Orders placed or performed during the hospital encounter of 08/23/15  . ED EKG  . ED EKG  . EKG 12-Lead  . EKG 12-Lead    IMPRESSION AND PLAN:  Principal Problem:   Intractable pain - controlled with high doses narcotics, continue pain control regimen.  Almost certainly due to her malignancy.   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 Management plans discussed with the patient and/or family.  DVT PROPHYLAXIS: SubQ lovenox  GI PROPHYLAXIS: None  ADMISSION STATUS: Observation  CODE STATUS: Full Code Status History    This patient does not have a recorded code status. Please follow your organizational policy for patients in this situation.      TOTAL TIME TAKING CARE OF THIS PATIENT: 45 minutes.    Mahmud Keithly [place] 08/24/2015, 12:54 AM  [facility]  Office  [phone]  CC: Primary care physician; Ellamae Sia, MD

## 2015-08-24 NOTE — Care Management Obs Status (Addendum)
[facility] NOTIFICATION   Patient Details  Name: Annette [name] MRN: [id number] Date of Birth: 03-02-1960   Medicare Observation Status Notification Given:  Yes  Reviewed this form with [name] and her Mother. Mother wants to discuss Observation status with MD. Left copy of this form unsigned with [name].     Jamian Andujo A, RN 08/24/2015, 8:37 AM

## 2015-08-24 NOTE — Progress Notes (Signed)
[facility] at [place] NAME: Annette [name]    MR#:  [id number]  DATE OF BIRTH:  1960/04/18  SUBJECTIVE:  CHIEF COMPLAINT:   Chief Complaint  Patient presents with  . Abdominal Pain   - Recent diagnosis of cancer- metastatic - possible PEComa with lung, liver lesions - Patient has chronic back pain following with pain management clinic. Now presents with worsening abdominal pain especially in the right flank region. -Appears restless in bed, trying to get comfortable  REVIEW OF SYSTEMS:  Review of Systems  Constitutional: Positive for malaise/fatigue. Negative for fever and chills.  HENT: Negative for ear discharge, ear pain and nosebleeds.   Eyes: Negative for blurred vision.  Respiratory: Negative for cough, shortness of breath and wheezing.   Cardiovascular: Positive for chest pain. Negative for palpitations and leg swelling.  Gastrointestinal: Positive for abdominal pain. Negative for nausea, vomiting, diarrhea and constipation.  Genitourinary: Negative for dysuria and urgency.  Musculoskeletal: Positive for myalgias and back pain.  Neurological: Negative for dizziness, sensory change, speech change, focal weakness, seizures and headaches.  Psychiatric/Behavioral: Negative for depression.    DRUG ALLERGIES:   Allergies  Allergen Reactions  . Iodides Swelling  . Iodine Swelling  . Shellfish Allergy Swelling    shrimp causes swelling  . Shellfish-Derived Products Swelling  . Iodinated Diagnostic Agents Swelling  . Povidone Iodine Swelling    eyes  . Povidone-Iodine Swelling    VITALS:  Blood pressure 141/84, pulse 90, temperature 98.7 F (37.1 C), temperature source Oral, resp. rate 14, height 5\' 5"  (1.651 m), weight 132.904 kg (293 lb), SpO2 95 %.  PHYSICAL EXAMINATION:  Physical Exam  GENERAL:  55 y.o.-year-old Obese patient lying in the bed with no acute distress. Heavily built and well-nourished. EYES: Pupils  equal, round, reactive to light and accommodation. No scleral icterus. Extraocular muscles intact.  HEENT: Head atraumatic, normocephalic. Oropharynx and nasopharynx clear.  NECK:  Supple, no jugular venous distention. No thyroid enlargement, no tenderness.  LUNGS: Normal breath sounds bilaterally, no wheezing, rales,rhonchi or crepitation. No use of accessory muscles of respiration.  CARDIOVASCULAR: S1, S2 normal. No murmurs, rubs, or gallops.  ABDOMEN: Soft, tender in the right flank, right upper quadrant regions., nondistended. Bowel sounds present. No organomegaly or mass.  EXTREMITIES: No pedal edema, cyanosis, or clubbing.  NEUROLOGIC: Cranial nerves II through XII are intact. Muscle strength 5/5 in all extremities. Sensation intact. Gait not checked.  PSYCHIATRIC: The patient is alert and oriented x 3. very restless appearing in bed. SKIN: No obvious rash, lesion, or ulcer.    LABORATORY PANEL:   CBC  Recent Labs Lab 08/24/15 0509  WBC 9.1  HGB 12.4  HCT 37.0  PLT 131*   ------------------------------------------------------------------------------------------------------------------  Chemistries   Recent Labs Lab 08/23/15 1519 08/24/15 0509  NA 138 140  K 3.4* 3.5  CL 100* 104  CO2 28 30  GLUCOSE 115* 144*  BUN 10 10  CREATININE 0.88 0.87  CALCIUM 9.1 8.6*  AST 35  --   ALT 27  --   ALKPHOS 81  --   BILITOT <0.1*  --    ------------------------------------------------------------------------------------------------------------------  Cardiac Enzymes  Recent Labs Lab 08/24/15 0509  TROPONINI <0.03   ------------------------------------------------------------------------------------------------------------------  RADIOLOGY:  Ct Abdomen Pelvis Wo Contrast  08/23/2015  CLINICAL DATA:  Upper and lower abdominal pain. Recently diagnosed uterine cancer. EXAM: CT ABDOMEN AND PELVIS WITHOUT CONTRAST TECHNIQUE: Multidetector CT imaging of the abdomen and  pelvis was performed  following the standard protocol without IV contrast. COMPARISON:  07/23/2015 FINDINGS: Lower chest: Numerous nodules are again seen in the visualized lung bases and have increased in size, with the largest measuring 18 mm in the right lower lobe (series 4, image 18, previously 13 mm). There is no pleural effusion. Hepatobiliary: The numerous hypoattenuating liver lesions which have increased in size and number from the prior study, the largest measuring 3.1 cm in segment IV (series 2, image 18, previously 1.7 cm). Gallbladder is unremarkable. No biliary dilatation. Pancreas: Unremarkable. Spleen: Unremarkable. Adrenals/Urinary Tract: Unremarkable adrenal glands and right kidney. Slight fullness of the left renal collecting system. No significant ureteral dilatation. Bladder is decompressed. Stomach/Bowel: Oral contrast is present in the stomach and multiple loops of nondilated proximal to mid small bowel. There is no evidence of bowel obstruction. The appendix is unremarkable. Vascular/Lymphatic: Normal caliber of the abdominal aorta. Rounded soft tissue posteriorly in the left aspect of the pelvis measures 3.2 x 3.1 cm (series 2, image 73, previously 2.3 x 3.0 cm). Reproductive: Suspected calcified fibroid in the posterior superior uterine segment is unchanged. Masslike enlargement of the uterus more inferiorly is grossly similar to the prior study, with assessment partially limited by streak artifact from bilateral hip arthroplasties. Other: No free fluid. Musculoskeletal: Bilateral hip arthroplasties. No suspicious lytic or blastic osseous lesion identified. IMPRESSION: 1. Interval progression of lung and liver metastases. 2. Enlargement of left pelvic nodal metastasis/ peritoneal implant. 3. Grossly similar appearance of infiltrative lower uterine mass. Electronically Signed   By: Logan Bores M.D.   On: 08/23/2015 20:40   Dg Chest 2 View  08/23/2015  CLINICAL DATA:  Pain under right  breast and in right arm with shortness of breath beginning this morning. EXAM: CHEST  2 VIEW COMPARISON:  Chest x-ray dated 12/07/2011. FINDINGS: Heart size is normal. Atherosclerotic changes noted at the aortic arch. Ill-defined nodularity is seen within the right lower lung, largest within the lateral aspects of the right lower lung. Left lung is relatively clear. No pleural effusion or pneumothorax seen. Osseous structures about the chest are unremarkable. IMPRESSION: Probable small pulmonary nodules within the right lower lung, perhaps with surrounding interstitial edema. Recommend chest CT for further characterization. Aortic atherosclerosis. Electronically Signed   By: Franki Cabot M.D.   On: 08/23/2015 18:56   Nm Pulmonary Perf And Vent  08/23/2015  CLINICAL DATA:  Pain. EXAM: NUCLEAR MEDICINE VENTILATION - PERFUSION LUNG SCAN TECHNIQUE: Ventilation images were obtained in multiple projections using inhaled aerosol Tc-80m DTPA. Perfusion images were obtained in multiple projections after intravenous injection of Tc-32m MAA. RADIOPHARMACEUTICALS:  32.1 mCi Technetium-34m DTPA aerosol inhalation and 3.88 mCi Technetium-36m MAA IV COMPARISON:  Chest x-ray from earlier today FINDINGS: Ventilation: No focal ventilation defect. Perfusion: No wedge shaped peripheral perfusion defects to suggest acute pulmonary embolism. IMPRESSION: No evidence of pulmonary emboli.  Very low probability V/Q scan. Electronically Signed   By: Dorise Bullion III M.D   On: 08/23/2015 23:55    EKG:   Orders placed or performed during the hospital encounter of 08/23/15  . ED EKG  . ED EKG  . EKG 12-Lead  . EKG 12-Lead    ASSESSMENT AND PLAN:   55 year old female with past medical history significant for hypertension, neuropathy, depression, diabetes mellitus, chronic pain from the lower back presents to the hospital secondary to acute on chronic worsening abdominal pain.  #1 acute abdominal pain-especially in the right  flank region and posteriorly. -CT of the abdomen showing calcified  uterine fibroid, uterine enlargement, worsening liver lesions compared to CT abdomen from last month and also bilateral lung nodules in the bases. -Likely from underlying malignancy. No other signs of obstruction or other acute pathology noted. -We'll start on OxyContin twice a day, also when necessary oxycodone for breakthrough pain. Patient has been on pain medications for a long time for her chronic pain, so we will have high tolerance. We'll adjust medications accordingly. -No nausea or vomiting, so diet as tolerated. -IV fluids.  #2 metastatic malignancy-biopsy from liver lesion showing PEComa-? Lung origin - worsening of lung and liver lesions when compared to last month CT - Oncology consult for the same  #3 hypertension-on lisinopril. Hydrochlorothiazide on hold.  #4 diabetes mellitus-metformin is on hold due to her CT abdomen. Start sliding scale insulin.  #5 chronic pain syndrome-follows with pain management clinic. No sent is on hold as patient is on OxyContin and oxycodone. Also on gabapentin which can be restarted.  #6 DVT prophylaxis-continue Lovenox.   All the records are reviewed and case discussed with Care Management/Social Workerr. Management plans discussed with the patient, family and they are in agreement.  CODE STATUS: Full Code  TOTAL TIME TAKING CARE OF THIS PATIENT: 37 minutes.   POSSIBLE D/C IN 2 DAYS, DEPENDING ON CLINICAL CONDITION.   Gladstone Lighter M.D on 08/24/2015 at 10:20 AM  Between 7am to 6pm - Pager - [phone]  After 6pm go to [email or web address] - password [email or web address] [facility]  Office  [phone]  CC: Primary care physician; Ellamae Sia, MD

## 2015-08-25 ENCOUNTER — Observation Stay: Payer: Medicare Other

## 2015-08-25 ENCOUNTER — Ambulatory Visit: Payer: Medicare Other

## 2015-08-25 DIAGNOSIS — M545 Low back pain: Secondary | ICD-10-CM

## 2015-08-25 DIAGNOSIS — R1011 Right upper quadrant pain: Secondary | ICD-10-CM | POA: Diagnosis not present

## 2015-08-25 DIAGNOSIS — R109 Unspecified abdominal pain: Secondary | ICD-10-CM

## 2015-08-25 DIAGNOSIS — G893 Neoplasm related pain (acute) (chronic): Secondary | ICD-10-CM | POA: Diagnosis not present

## 2015-08-25 DIAGNOSIS — F419 Anxiety disorder, unspecified: Secondary | ICD-10-CM

## 2015-08-25 DIAGNOSIS — C55 Malignant neoplasm of uterus, part unspecified: Secondary | ICD-10-CM | POA: Diagnosis not present

## 2015-08-25 DIAGNOSIS — K5900 Constipation, unspecified: Secondary | ICD-10-CM

## 2015-08-25 DIAGNOSIS — C787 Secondary malignant neoplasm of liver and intrahepatic bile duct: Secondary | ICD-10-CM | POA: Diagnosis not present

## 2015-08-25 LAB — GLUCOSE, CAPILLARY
GLUCOSE-CAPILLARY: 124 mg/dL — AB (ref 65–99)
GLUCOSE-CAPILLARY: 137 mg/dL — AB (ref 65–99)
GLUCOSE-CAPILLARY: 107 mg/dL — AB (ref 65–99)
Glucose-Capillary: 111 mg/dL — ABNORMAL HIGH (ref 65–99)

## 2015-08-25 LAB — BASIC METABOLIC PANEL
ANION GAP: 7 (ref 5–15)
BUN: 10 mg/dL (ref 6–20)
CHLORIDE: 105 mmol/L (ref 101–111)
CO2: 30 mmol/L (ref 22–32)
Calcium: 8.7 mg/dL — ABNORMAL LOW (ref 8.9–10.3)
Creatinine, Ser: 0.82 mg/dL (ref 0.44–1.00)
GFR calc non Af Amer: >60 mL/min (ref >60)
Glucose, Bld: 109 mg/dL — ABNORMAL HIGH (ref 65–99)
Potassium: 3.6 mmol/L (ref 3.5–5.1)
Sodium: 142 mmol/L (ref 135–145)

## 2015-08-25 MED ORDER — OXYCODONE HCL ER 20 MG PO T12A
20.0000 mg | EXTENDED_RELEASE_TABLET | Freq: Two times a day (BID) | ORAL | Status: DC
  Administered 2015-08-25 – 2015-08-26 (×3): 20 mg via ORAL
  Filled 2015-08-25 (×3): qty 1

## 2015-08-25 MED ORDER — OXYCODONE HCL 5 MG PO TABS
10.0000 mg | ORAL_TABLET | ORAL | Status: DC | PRN
  Administered 2015-08-25 – 2015-08-28 (×14): 10 mg via ORAL
  Filled 2015-08-25 (×14): qty 2

## 2015-08-25 MED ORDER — HYDROMORPHONE HCL 1 MG/ML IJ SOLN
0.5000 mg | Freq: Once | INTRAMUSCULAR | Status: AC
  Administered 2015-08-26: 05:00:00 0.5 mg via INTRAVENOUS
  Filled 2015-08-25: qty 1

## 2015-08-25 NOTE — Consult Note (Signed)
[facility] CONSULT NOTE  Patient Care Team: Harriett Neita Carp, MD as PCP - General (Internal Medicine) Clent Jacks, RN as Registered Nurse  CHIEF COMPLAINTS/PURPOSE OF CONSULTATION:  Metastatic malignancy.  HISTORY OF PRESENTING ILLNESS:  Annette [name] 55 y.o.  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 The interim patient had worsening abdominal pain currently as a hospital for pain control. CT scan of the abdomen and pelvis in the emergency room showed- multiple liver lesions increasing in size and number. Also showed a pelvic mass in the left side; along with the fibroid uterus. Patient also had a VQ scan emergency room for shortness of breath is negative for PE.  Patient complains of pain in the right upper quadrant; right shoulder/chest area and also had lower back. She has chronic low back pain; and also chronic weakness of the right lower extremity. She has poor appetite. No weight loss. No nausea no vomiting. She is very anxious.  Denies any shortness of breath or cough. No nausea no vomiting. Constipation.  ROS: A complete 10 point review of system is done which is negative except mentioned above in history of present illness  MEDICAL HISTORY:  Past Medical History  Diagnosis Date  . Hypertension   . Depression   . Peripheral neuropathy ([place])   . Renal insufficiency   . Diabetes mellitus without complication ([place])   . Onychomycosis   . Obesity   . DDD (degenerative disc disease), lumbar   . Menopausal syndrome   . Asthma   . Back pain   . Hyperlipidemia   . Insomnia     SURGICAL HISTORY: Past Surgical History  Procedure Laterality Date  . Reduction mammaplasty Bilateral 12/15/2013  . Joint replacement Bilateral [id number]  . Shoulder  surgery Left   . Cosmetic surgery    . Liver biopsy      SOCIAL HISTORY: Social History   Social History  . Marital Status: Single    Spouse Name: N/A  . Number of Children: N/A  . Years of Education: N/A   Occupational History  . Not on file.   Social History Main Topics  . Smoking status: Current Every Day Smoker -- 0.20 packs/day    Types: Cigarettes  . Smokeless tobacco: Not on file  . Alcohol Use: No  . Drug Use: No  . Sexual Activity: Not on file   Other Topics Concern  . Not on file   Social History Narrative    FAMILY HISTORY: Family History  Problem Relation Age of Onset  . COPD Mother   . Sleep apnea Mother   . Alcohol abuse Mother   . Depression Mother   . Stroke Mother   . Heart disease Father   . Heart disease Sister     ALLERGIES:  is allergic to iodides; iodine; shellfish allergy; shellfish-derived products; iodinated diagnostic agents; povidone iodine; and povidone-iodine.  MEDICATIONS:  Current Facility-Administered Medications  Medication Dose Route Frequency Provider Last Rate Last Dose  . acetaminophen (TYLENOL) tablet 650 mg  650 mg Oral Q6H PRN Lance Coon, MD       Or  . acetaminophen (TYLENOL) suppository 650 mg  650 mg Rectal Q6H PRN Lance Coon, MD      . albuterol (PROVENTIL) (2.5 MG/3ML) 0.083% nebulizer solution 3 mL  3 mL  Inhalation Q6H PRN Lance Coon, MD      . aspirin EC tablet 81 mg  81 mg Oral Daily Lance Coon, MD   81 mg at 08/25/15 [id number]  . enoxaparin (LOVENOX) injection 40 mg  40 mg Subcutaneous BID Lance Coon, MD   40 mg at 08/25/15 2029  . gabapentin (NEURONTIN) capsule 600 mg  600 mg Oral BID Gladstone Lighter, MD   600 mg at 08/25/15 0834  . insulin aspart (novoLOG) injection 0-15 Units  0-15 Units Subcutaneous TID WC Gladstone Lighter, MD   2 Units at 08/25/15 1216  . insulin aspart (novoLOG) injection 0-5 Units  0-5 Units Subcutaneous QHS Gladstone Lighter, MD   0 Units at 08/24/15 2200  . lactulose (CHRONULAC)  10 GM/15ML solution 30 g  30 g Oral BID PRN Lance Coon, MD      . lisinopril (PRINIVIL,ZESTRIL) tablet 10 mg  10 mg Oral Daily Lance Coon, MD   10 mg at 08/25/15 0834  . ondansetron (ZOFRAN) tablet 4 mg  4 mg Oral Q6H PRN Lance Coon, MD       Or  . ondansetron [facility]) injection 4 mg  4 mg Intravenous Q6H PRN Lance Coon, MD      . oxyCODONE (Oxy IR/ROXICODONE) immediate release tablet 10 mg  10 mg Oral Q4H PRN Gladstone Lighter, MD   10 mg at 08/25/15 2031  . oxyCODONE (OXYCONTIN) 12 hr tablet 20 mg  20 mg Oral Q12H Gladstone Lighter, MD   20 mg at 08/25/15 1004  . pantoprazole (PROTONIX) EC tablet 40 mg  40 mg Oral QAC breakfast Lance Coon, MD   40 mg at 08/25/15 0845  . pravastatin (PRAVACHOL) tablet 40 mg  40 mg Oral Daily Lance Coon, MD   40 mg at 08/25/15 0834  . senna-docusate (Senokot-S) tablet 2 tablet  2 tablet Oral Daily Gladstone Lighter, MD   2 tablet at 08/25/15 0834  . silver sulfADIAZINE (SILVADENE) 1 % cream   Topical Daily Gladstone Lighter, MD      . traZODone (DESYREL) tablet 150 mg  150 mg Oral BID Lance Coon, MD   150 mg at 08/25/15 0834  . Vilazodone HCl TABS 40 mg  40 mg Oral Daily Gladstone Lighter, MD   40 mg at 08/25/15 0832      .  PHYSICAL EXAMINATION:  Filed Vitals:   08/25/15 1244 08/25/15 2029  BP: 130/62 130/77  Pulse: 84 71  Temp: 98.3 F (36.8 C) 98.5 F (36.9 C)  Resp: 16 20   Filed Weights   08/23/15 1512 08/24/15 0218  Weight: 286 lb (129.729 kg) 293 lb (132.904 kg)    GENERAL: Well-nourished well-developed; Alert, no distress and comfortable. Obese. Accompanied by multiple family members. EYES: no pallor or icterus OROPHARYNX: no thrush or ulceration. NECK: supple, no masses felt LYMPH:  no palpable lymphadenopathy in the cervical, axillary or inguinal regions LUNGS: decreased breath sounds to auscultation at bases and  No wheeze or crackles HEART/CVS: regular rate & rhythm and no murmurs; No lower extremity edema ABDOMEN:  abdomen soft, non-tender and normal bowel sounds Musculoskeletal:no cyanosis of digits and no clubbing  PSYCH: alert & oriented x 3 with fluent speech NEURO: no focal motor/sensory deficits- except for mild weakness of the right lower extremity/foot drop [chronic.] SKIN:  no rashes or significant lesions  LABORATORY DATA:  I have reviewed the data as listed Lab Results  Component Value Date   WBC 9.1 08/24/2015   HGB 12.4 08/24/2015  HCT 37.0 08/24/2015   MCV 89.8 08/24/2015   PLT 131* 08/24/2015    Recent Labs  07/28/15 1235 08/23/15 1519 08/24/15 0509 08/25/15 0325  NA 136 138 140 142  K 4.0 3.4* 3.5 3.6  CL 100* 100* 104 105  CO2 27 28 30 30   GLUCOSE 113* 115* 144* 109*  BUN 13 10 10 10   CREATININE 1.01* 0.88 0.87 0.82  CALCIUM 9.4 9.1 8.6* 8.7*  GFRNONAA >60 >60 >60 >60  GFRAA >60 >60 >60 >60  PROT 8.0 8.0  --   --   ALBUMIN 4.2 4.0  --   --   AST 24 35  --   --   ALT 15 27  --   --   ALKPHOS 69 81  --   --   BILITOT 0.3 <0.1*  --   --     RADIOGRAPHIC STUDIES: I have personally reviewed the radiological images as listed and agreed with the findings in the report. Ct Abdomen Pelvis Wo Contrast  08/23/2015  CLINICAL DATA:  Upper and lower abdominal pain. Recently diagnosed uterine cancer. EXAM: CT ABDOMEN AND PELVIS WITHOUT CONTRAST TECHNIQUE: Multidetector CT imaging of the abdomen and pelvis was performed following the standard protocol without IV contrast. COMPARISON:  07/23/2015 FINDINGS: Lower chest: Numerous nodules are again seen in the visualized lung bases and have increased in size, with the largest measuring 18 mm in the right lower lobe (series 4, image 18, previously 13 mm). There is no pleural effusion. Hepatobiliary: The numerous hypoattenuating liver lesions which have increased in size and number from the prior study, the largest measuring 3.1 cm in segment IV (series 2, image 18, previously 1.7 cm). Gallbladder is unremarkable. No biliary dilatation.  Pancreas: Unremarkable. Spleen: Unremarkable. Adrenals/Urinary Tract: Unremarkable adrenal glands and right kidney. Slight fullness of the left renal collecting system. No significant ureteral dilatation. Bladder is decompressed. Stomach/Bowel: Oral contrast is present in the stomach and multiple loops of nondilated proximal to mid small bowel. There is no evidence of bowel obstruction. The appendix is unremarkable. Vascular/Lymphatic: Normal caliber of the abdominal aorta. Rounded soft tissue posteriorly in the left aspect of the pelvis measures 3.2 x 3.1 cm (series 2, image 73, previously 2.3 x 3.0 cm). Reproductive: Suspected calcified fibroid in the posterior superior uterine segment is unchanged. Masslike enlargement of the uterus more inferiorly is grossly similar to the prior study, with assessment partially limited by streak artifact from bilateral hip arthroplasties. Other: No free fluid. Musculoskeletal: Bilateral hip arthroplasties. No suspicious lytic or blastic osseous lesion identified. IMPRESSION: 1. Interval progression of lung and liver metastases. 2. Enlargement of left pelvic nodal metastasis/ peritoneal implant. 3. Grossly similar appearance of infiltrative lower uterine mass. Electronically Signed   By: Logan Bores M.D.   On: 08/23/2015 20:40   Dg Chest 2 View  08/23/2015  CLINICAL DATA:  Pain under right breast and in right arm with shortness of breath beginning this morning. EXAM: CHEST  2 VIEW COMPARISON:  Chest x-ray dated 12/07/2011. FINDINGS: Heart size is normal. Atherosclerotic changes noted at the aortic arch. Ill-defined nodularity is seen within the right lower lung, largest within the lateral aspects of the right lower lung. Left lung is relatively clear. No pleural effusion or pneumothorax seen. Osseous structures about the chest are unremarkable. IMPRESSION: Probable small pulmonary nodules within the right lower lung, perhaps with surrounding interstitial edema. Recommend  chest CT for further characterization. Aortic atherosclerosis. Electronically Signed   By: Roxy Horseman.D.  On: 08/23/2015 18:56   Ct Chest Wo Contrast  08/25/2015  CLINICAL DATA:  Pulmonary nodules by chest x-ray 08/23/2015. History of metastatic uterine cancer. EXAM: CT CHEST WITHOUT CONTRAST TECHNIQUE: Multidetector CT imaging of the chest was performed following the standard protocol without IV contrast. COMPARISON:  08/23/2015, 07/23/2015, 01/30/2008 FINDINGS: Mediastinum/Lymph Nodes: Atherosclerosis of the thoracic aorta noted. No definite mediastinal or hilar adenopathy within the limits of noncontrast imaging. Normal heart size. No pericardial effusion. Lungs/Pleura: Numerous bilateral new pulmonary nodules throughout all lobes of both lungs. These are new compared to 01/30/2008 chest CT. Nodules are compatible with diffuse pulmonary metastases. Index peripheral right lower lobe pulmonary nodule measures 16 mm, image 80. Index left upper lobe pulmonary nodule measures 10 mm, image 33. No superimposed edema, collapse or consolidation. No focal pneumonia or airspace process. No pleural abnormality, effusion, or pneumothorax. Trachea and central airways remain patent. Upper abdomen: Numerous hypodense lesions throughout the imaged portion of the liver compatible with extensive hepatic metastases. No biliary dilatation. Gallbladder is collapsed. No upper abdominal adenopathy. Musculoskeletal: Minor thoracic degenerative change. No acute compression fracture. Thoracic spine and sternum appear intact. IMPRESSION: Innumerable bilateral pulmonary metastases involving all lobes of both lungs. Evidence of extensive diffuse hepatic metastases as well without biliary obstruction or dilatation. Thoracic aortic atherosclerosis Electronically Signed   By: Jerilynn Mages.  Shick M.D.   On: 08/25/2015 14:42   Nm Pulmonary Perf And Vent  08/23/2015  CLINICAL DATA:  Pain. EXAM: NUCLEAR MEDICINE VENTILATION - PERFUSION LUNG SCAN  TECHNIQUE: Ventilation images were obtained in multiple projections using inhaled aerosol Tc-103m DTPA. Perfusion images were obtained in multiple projections after intravenous injection of Tc-40m MAA. RADIOPHARMACEUTICALS:  32.1 mCi Technetium-48m DTPA aerosol inhalation and 3.88 mCi Technetium-57m MAA IV COMPARISON:  Chest x-ray from earlier today FINDINGS: Ventilation: No focal ventilation defect. Perfusion: No wedge shaped peripheral perfusion defects to suggest acute pulmonary embolism. IMPRESSION: No evidence of pulmonary emboli.  Very low probability V/Q scan. Electronically Signed   By: Dorise Bullion III M.D   On: 08/23/2015 23:55   US Biopsy  08/02/2015  INDICATION: 55 year old female with presumed metastatic disease. Patient was found have pulmonary nodules, liver lesions and a suspicious uterine lesion on recent CT examination. Tissue diagnosis is needed. EXAM: ULTRASOUND-GUIDED LIVER LESION BIOPSY MEDICATIONS: None. ANESTHESIA/SEDATION: Moderate (conscious) sedation was employed during this procedure. A total of Versed 2.5 mg and Fentanyl 75 mcg was administered intravenously. Moderate Sedation Time: 22 minutes. The patient's level of consciousness and vital signs were monitored continuously by radiology nursing throughout the procedure under my direct supervision. FLUOROSCOPY TIME:  None COMPLICATIONS: None immediate. PROCEDURE: Informed written consent was obtained from the patient after a thorough discussion of the procedural risks, benefits and alternatives. A timeout was performed prior to the initiation of the procedure. Liver was evaluated with ultrasound. Few scattered heterogeneous lesions identified throughout the liver. A lesion in the left hepatic lobe between the medial and lateral segments was felt to be the most accessible for biopsy. The anterior and right side abdomen were prepped with chlorhexidine. Sterile field was created. Skin and soft tissues were anesthetized with 1%  lidocaine. Using ultrasound guidance, 17 gauge needle was directed into this liver lesion. Needle position was confirmed within the lesion. Three core biopsies obtained with an 18 gauge core device. Specimens placed in formalin. 17 gauge needle removed without complication. Bandage placed over the puncture site. FINDINGS: Heterogeneous lesions scattered throughout the left and right hepatic lobes. A lesion in the left hepatic  lobe near the junction of the medial and lateral segments was biopsied. Needle position confirmed within this lesion. No significant bleeding following the core biopsies. IMPRESSION: Ultrasound-guided core biopsies of a left hepatic lesion. Electronically Signed   By: Markus Daft M.D.   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 We'll await CT scan of the chest for further evaluation of the metastatic disease in the lungs. I had a long discussion the patient and multiple family members- regarding the incurable nature of the disease/palliative treatments would be recommended. I do not think patient is a candidate for surgery given the widespread metastatic disease. No role for radiation unless patient has metastatic lesion which could be attributed to her worsening pain. I discussed the role of targeted therapy-M.inhibitors-like Torisel. I discussed the potential side effects including-GI side effects skin rash; metabolic syndrome etc. Will plan outpatient likely next week.  # Worsening of chronic pain- patient has multiple worsening lesions in the liver- which could potentially explain her worsening pain in the abdomen. However I do not see any evidence of metastatic disease involving  the bone- which would explain her worsening pain. Await the CT of the chest. Currently continue current pain management. I'll speak to the patient's pain physician regarding pain control.  I reviewed the images myself; reviewed the images with the patient and family. The above plan of care was discussed at length; all questions were answered.   Above plan of care was also discussed with Dr. Tressia Miners.   Thank you Dr. Tressia Miners for allowing me to participate in the care of your pleasant patient. Please do not hesitate to contact me with questions or concerns in the interim.   All questions were answered. The patient knows to call the clinic with any problems, questions or concerns.     Cammie Sickle, MD 08/25/2015 9:11 PM

## 2015-08-25 NOTE — Progress Notes (Signed)
[facility] [place] NAME: Annette [name]    MR#:  [id number]  DATE OF BIRTH:  Mar 14, 1960  SUBJECTIVE:  CHIEF COMPLAINT:   Chief Complaint  Patient presents with  . 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  REVIEW OF SYSTEMS:  Review of Systems  Constitutional: Positive for malaise/fatigue. Negative for fever and chills.  HENT: Negative for ear discharge, ear pain and nosebleeds.   Eyes: Negative for blurred vision.  Respiratory: Negative for cough, shortness of breath and wheezing.   Cardiovascular: Positive for chest pain. Negative for palpitations and leg swelling.  Gastrointestinal: Positive for abdominal pain. Negative for nausea, vomiting, diarrhea and constipation.  Genitourinary: Negative for dysuria and urgency.  Musculoskeletal: Positive for myalgias and back pain.  Neurological: Negative for dizziness, sensory change, speech change, focal weakness, seizures and headaches.  Psychiatric/Behavioral: Negative for depression. The patient is nervous/anxious.     DRUG ALLERGIES:   Allergies  Allergen Reactions  . Iodides Swelling  . Iodine Swelling  . Shellfish Allergy Swelling    shrimp causes swelling  . Shellfish-Derived Products Swelling  . Iodinated Diagnostic Agents Swelling  . Povidone Iodine Swelling    eyes  . Povidone-Iodine Swelling    VITALS:  Blood pressure 130/62, pulse 84, temperature 98.3 F (36.8 C), temperature source Oral, resp. rate 16, height 5\' 5"  (1.651 m), weight 132.904 kg (293 lb), SpO2 98 %.  PHYSICAL EXAMINATION:  Physical Exam  GENERAL:  55 y.o.-year-old Obese patient lying in the bed with no acute distress. Heavily built and well-nourished. EYES: Pupils equal, round,  reactive to light and accommodation. No scleral icterus. Extraocular muscles intact.  HEENT: Head atraumatic, normocephalic. Oropharynx and nasopharynx clear.  NECK:  Supple, no jugular venous distention. No thyroid enlargement, no tenderness.  LUNGS: Normal breath sounds bilaterally, no wheezing, rales,rhonchi or crepitation. No use of accessory muscles of respiration.  CARDIOVASCULAR: S1, S2 normal. No murmurs, rubs, or gallops.  ABDOMEN: Soft, tender in the right flank, right upper quadrant regions., nondistended. Bowel sounds present. No organomegaly or mass.  EXTREMITIES: No pedal edema, cyanosis, or clubbing.  NEUROLOGIC: Cranial nerves II through XII are intact. Muscle strength 5/5 in all extremities. Sensation intact. Gait not checked.  PSYCHIATRIC: The patient is alert and oriented x 3. very restless appearing in bed. SKIN: No obvious rash, lesion, or ulcer.    LABORATORY PANEL:   CBC  Recent Labs Lab 08/24/15 0509  WBC 9.1  HGB 12.4  HCT 37.0  PLT 131*   ------------------------------------------------------------------------------------------------------------------  Chemistries   Recent Labs Lab 08/23/15 1519  08/25/15 0325  NA 138  < > 142  K 3.4*  < > 3.6  CL 100*  < > 105  CO2 28  < > 30  GLUCOSE 115*  < > 109*  BUN 10  < > 10  CREATININE 0.88  < > 0.82  CALCIUM 9.1  < > 8.7*  AST 35  --   --   ALT 27  --   --   ALKPHOS 81  --   --   BILITOT <0.1*  --   --   < > = values in this interval not displayed. ------------------------------------------------------------------------------------------------------------------  Cardiac Enzymes  Recent Labs Lab 08/24/15 0509  TROPONINI <0.03   ------------------------------------------------------------------------------------------------------------------  RADIOLOGY:  Ct Abdomen Pelvis Wo Contrast  08/23/2015  CLINICAL DATA:  Upper and lower abdominal pain. Recently diagnosed uterine cancer. EXAM: CT  ABDOMEN AND PELVIS WITHOUT CONTRAST TECHNIQUE: Multidetector CT imaging of the abdomen and pelvis was performed following the standard protocol without IV contrast. COMPARISON:  07/23/2015 FINDINGS: Lower chest: Numerous nodules are again seen in the visualized lung bases and have increased in size, with the largest measuring 18 mm in the right lower lobe (series 4, image 18, previously 13 mm). There is no pleural effusion. Hepatobiliary: The numerous hypoattenuating liver lesions which have increased in size and number from the prior study, the largest measuring 3.1 cm in segment IV (series 2, image 18, previously 1.7 cm). Gallbladder is unremarkable. No biliary dilatation. Pancreas: Unremarkable. Spleen: Unremarkable. Adrenals/Urinary Tract: Unremarkable adrenal glands and right kidney. Slight fullness of the left renal collecting system. No significant ureteral dilatation. Bladder is decompressed. Stomach/Bowel: Oral contrast is present in the stomach and multiple loops of nondilated proximal to mid small bowel. There is no evidence of bowel obstruction. The appendix is unremarkable. Vascular/Lymphatic: Normal caliber of the abdominal aorta. Rounded soft tissue posteriorly in the left aspect of the pelvis measures 3.2 x 3.1 cm (series 2, image 73, previously 2.3 x 3.0 cm). Reproductive: Suspected calcified fibroid in the posterior superior uterine segment is unchanged. Masslike enlargement of the uterus more inferiorly is grossly similar to the prior study, with assessment partially limited by streak artifact from bilateral hip arthroplasties. Other: No free fluid. Musculoskeletal: Bilateral hip arthroplasties. No suspicious lytic or blastic osseous lesion identified. IMPRESSION: 1. Interval progression of lung and liver metastases. 2. Enlargement of left pelvic nodal metastasis/ peritoneal implant. 3. Grossly similar appearance of infiltrative lower uterine mass. Electronically Signed   By: Logan Bores M.D.    On: 08/23/2015 20:40   Dg Chest 2 View  08/23/2015  CLINICAL DATA:  Pain under right breast and in right arm with shortness of breath beginning this morning. EXAM: CHEST  2 VIEW COMPARISON:  Chest x-ray dated 12/07/2011. FINDINGS: Heart size is normal. Atherosclerotic changes noted at the aortic arch. Ill-defined nodularity is seen within the right lower lung, largest within the lateral aspects of the right lower lung. Left lung is relatively clear. No pleural effusion or pneumothorax seen. Osseous structures about the chest are unremarkable. IMPRESSION: Probable small pulmonary nodules within the right lower lung, perhaps with surrounding interstitial edema. Recommend chest CT for further characterization. Aortic atherosclerosis. Electronically Signed   By: Franki Cabot M.D.   On: 08/23/2015 18:56   Nm Pulmonary Perf And Vent  08/23/2015  CLINICAL DATA:  Pain. EXAM: NUCLEAR MEDICINE VENTILATION - PERFUSION LUNG SCAN TECHNIQUE: Ventilation images were obtained in multiple projections using inhaled aerosol Tc-53m DTPA. Perfusion images were obtained in multiple projections after intravenous injection of Tc-17m MAA. RADIOPHARMACEUTICALS:  32.1 mCi Technetium-25m DTPA aerosol inhalation and 3.88 mCi Technetium-70m MAA IV COMPARISON:  Chest x-ray from earlier today FINDINGS: Ventilation: No focal ventilation defect. Perfusion: No wedge shaped peripheral perfusion defects to suggest acute pulmonary embolism. IMPRESSION: No evidence of pulmonary emboli.  Very low probability V/Q scan. Electronically Signed   By: Dorise Bullion III M.D   On: 08/23/2015 23:55    EKG:   Orders placed or performed during the hospital encounter of 08/23/15  . ED EKG  . ED EKG  . EKG 12-Lead  . EKG 12-Lead    ASSESSMENT AND PLAN:   55 year old female with past medical history significant for hypertension,  neuropathy, depression, diabetes mellitus, chronic pain from the lower back presents to the hospital secondary to acute  on chronic worsening abdominal pain.  #1 acute abdominal pain-especially in the right flank region and posteriorly. -Partly from her malignancy and also from her chronic pain -CT of the abdomen showing calcified uterine fibroid, uterine enlargement, worsening liver lesions compared to CT abdomen from last month and also bilateral lung nodules in the bases. -Likely from underlying malignancy. No other signs of obstruction or other acute pathology noted. -Started on OxyContin twice a day, also when necessary oxycodone for breakthrough pain.  -Patient has been on pain medications for a long time for her chronic pain, so we will have high tolerance. adjust medications accordingly. -No nausea or vomiting,  diet as tolerated. 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   #3 hypertension-on lisinopril. Hydrochlorothiazide on hold.  #4 diabetes mellitus-metformin is on hold. On sliding scale insulin.  #5 chronic pain syndrome-follows with pain management clinic. Nucynta is on hold as patient is on OxyContin and oxycodone. Also on gabapentin.  #6 DVT prophylaxis-continue Lovenox.  Encourage ambulation today. Anticipate discharge tomorrow if pain controlled.   All the records are reviewed and case discussed with Care Management/Social Workerr. Management plans discussed with the patient, family and they are in agreement.  CODE STATUS: Full Code  TOTAL TIME TAKING CARE OF THIS PATIENT: 37 minutes.   POSSIBLE D/C IN 2 DAYS, DEPENDING ON CLINICAL CONDITION.   Gladstone Lighter M.D on 08/25/2015 at 2:27 PM  Between 7am to 6pm - Pager - [phone]  After 6pm go to [email or web address] - password [email or web address] [facility]  Office  [phone]  CC: Primary care  physician; Ellamae Sia, MD

## 2015-08-26 ENCOUNTER — Inpatient Hospital Stay: Payer: Medicare Other | Admitting: Internal Medicine

## 2015-08-26 ENCOUNTER — Other Ambulatory Visit: Payer: Self-pay | Admitting: Internal Medicine

## 2015-08-26 ENCOUNTER — Ambulatory Visit: Payer: Medicare Other

## 2015-08-26 ENCOUNTER — Inpatient Hospital Stay: Payer: Medicare Other

## 2015-08-26 DIAGNOSIS — C787 Secondary malignant neoplasm of liver and intrahepatic bile duct: Secondary | ICD-10-CM | POA: Diagnosis not present

## 2015-08-26 DIAGNOSIS — C55 Malignant neoplasm of uterus, part unspecified: Secondary | ICD-10-CM | POA: Diagnosis not present

## 2015-08-26 DIAGNOSIS — G893 Neoplasm related pain (acute) (chronic): Secondary | ICD-10-CM | POA: Diagnosis not present

## 2015-08-26 DIAGNOSIS — M5136 Other intervertebral disc degeneration, lumbar region: Secondary | ICD-10-CM

## 2015-08-26 DIAGNOSIS — I10 Essential (primary) hypertension: Secondary | ICD-10-CM

## 2015-08-26 DIAGNOSIS — C7800 Secondary malignant neoplasm of unspecified lung: Secondary | ICD-10-CM | POA: Diagnosis not present

## 2015-08-26 DIAGNOSIS — G894 Chronic pain syndrome: Secondary | ICD-10-CM | POA: Diagnosis not present

## 2015-08-26 DIAGNOSIS — R630 Anorexia: Secondary | ICD-10-CM

## 2015-08-26 DIAGNOSIS — C548 Malignant neoplasm of overlapping sites of corpus uteri: Secondary | ICD-10-CM

## 2015-08-26 DIAGNOSIS — N2889 Other specified disorders of kidney and ureter: Secondary | ICD-10-CM

## 2015-08-26 DIAGNOSIS — J45909 Unspecified asthma, uncomplicated: Secondary | ICD-10-CM

## 2015-08-26 DIAGNOSIS — R0602 Shortness of breath: Secondary | ICD-10-CM

## 2015-08-26 DIAGNOSIS — M549 Dorsalgia, unspecified: Secondary | ICD-10-CM

## 2015-08-26 DIAGNOSIS — G4700 Insomnia, unspecified: Secondary | ICD-10-CM

## 2015-08-26 DIAGNOSIS — E119 Type 2 diabetes mellitus without complications: Secondary | ICD-10-CM

## 2015-08-26 DIAGNOSIS — G629 Polyneuropathy, unspecified: Secondary | ICD-10-CM

## 2015-08-26 DIAGNOSIS — F17210 Nicotine dependence, cigarettes, uncomplicated: Secondary | ICD-10-CM

## 2015-08-26 DIAGNOSIS — R1011 Right upper quadrant pain: Secondary | ICD-10-CM

## 2015-08-26 DIAGNOSIS — F329 Major depressive disorder, single episode, unspecified: Secondary | ICD-10-CM

## 2015-08-26 DIAGNOSIS — Z794 Long term (current) use of insulin: Secondary | ICD-10-CM

## 2015-08-26 DIAGNOSIS — Z79899 Other long term (current) drug therapy: Secondary | ICD-10-CM

## 2015-08-26 DIAGNOSIS — E785 Hyperlipidemia, unspecified: Secondary | ICD-10-CM

## 2015-08-26 LAB — GLUCOSE, CAPILLARY
GLUCOSE-CAPILLARY: 114 mg/dL — AB (ref 65–99)
GLUCOSE-CAPILLARY: 116 mg/dL — AB (ref 65–99)
Glucose-Capillary: 124 mg/dL — ABNORMAL HIGH (ref 65–99)
Glucose-Capillary: 112 mg/dL — ABNORMAL HIGH (ref 65–99)

## 2015-08-26 MED ORDER — OXYCODONE HCL ER 20 MG PO T12A
20.0000 mg | EXTENDED_RELEASE_TABLET | Freq: Once | ORAL | Status: AC
  Administered 2015-08-26: 14:00:00 20 mg via ORAL
  Filled 2015-08-26: qty 1

## 2015-08-26 MED ORDER — OXYCODONE HCL ER 20 MG PO T12A
40.0000 mg | EXTENDED_RELEASE_TABLET | Freq: Two times a day (BID) | ORAL | Status: DC
  Administered 2015-08-26 – 2015-08-27 (×3): 40 mg via ORAL
  Filled 2015-08-26 (×3): qty 2

## 2015-08-26 NOTE — Plan of Care (Signed)
Problem: Pain Managment: Goal: General experience of comfort will improve Outcome: Not Progressing Pt c/o pain at freq intevals today.  md increased oxy. Giving b/t pain med.  Notified md of pt wanting pain med  Earlier than due.pt up and about in room  Problem: Skin Integrity: Goal: Risk for impaired skin integrity will decrease Outcome: Progressing Dressing change as ordered

## 2015-08-26 NOTE — Progress Notes (Signed)
[facility] [place] NAME: Annette [name]    MRN#:  [id number]  DATE OF BIRTH:  [birth year]  SUBJECTIVE:  Hospital Day: none Annette [name] is a 55 y.o. female presenting with Abdominal Pain .   Overnight events: No overnight events Interval Events: Pain seems better controlled this morning after adjustments, still some complaints however  REVIEW OF SYSTEMS:  CONSTITUTIONAL: No fever, fatigue or weakness.  EYES: No blurred or double vision.  EARS, NOSE, AND THROAT: No tinnitus or ear pain.  RESPIRATORY: No cough, shortness of breath, wheezing or hemoptysis.  CARDIOVASCULAR: No chest pain, orthopnea, edema.  GASTROINTESTINAL: No nausea, vomiting, diarrhea or abdominal pain.  GENITOURINARY: No dysuria, hematuria.  ENDOCRINE: No polyuria, nocturia,  HEMATOLOGY: No anemia, easy bruising or bleeding SKIN: No rash or lesion. MUSCULOSKELETAL: No joint pain or arthritis.   NEUROLOGIC: No tingling, numbness, weakness.  PSYCHIATRY: No anxiety or depression.   DRUG ALLERGIES:   Allergies  Allergen Reactions  . Iodides Swelling  . Iodine Swelling  . Shellfish Allergy Swelling    shrimp causes swelling  . Shellfish-Derived Products Swelling  . Iodinated Diagnostic Agents Swelling  . Povidone Iodine Swelling    eyes  . Povidone-Iodine Swelling    VITALS:  Blood pressure 155/74, pulse 76, temperature 98.5 F (36.9 C), temperature source Oral, resp. rate 20, height 5\' 5"  (1.651 m), weight 293 lb (132.904 kg), SpO2 99 %.  PHYSICAL EXAMINATION:  VITAL SIGNS: Filed Vitals:   08/26/15 0437 08/26/15 0835  BP: 147/85 155/74  Pulse: 71 76  Temp: 98.3 F (36.8 C) 98.5 F (36.9 C)  Resp: 18 68   GENERAL:54 y.o.female currently in no acute distress.  HEAD: Normocephalic, atraumatic.  EYES: Pupils equal, round, reactive to light. Extraocular muscles intact. No scleral icterus.  MOUTH: Moist mucosal membrane. Dentition intact. No abscess  noted.  EAR, NOSE, THROAT: Clear without exudates. No external lesions.  NECK: Supple. No thyromegaly. No nodules. No JVD.  PULMONARY: Clear to ascultation, without wheeze rails or rhonci. No use of accessory muscles, Good respiratory effort. good air entry bilaterally CHEST: Nontender to palpation.  CARDIOVASCULAR: S1 and S2. Regular rate and rhythm. No murmurs, rubs, or gallops. No edema. Pedal pulses 2+ bilaterally.  GASTROINTESTINAL: Soft, nontender, nondistended. No masses. Positive bowel sounds. No hepatosplenomegaly.  MUSCULOSKELETAL: No swelling, clubbing, or edema. Range of motion full in all extremities.  NEUROLOGIC: Cranial nerves II through XII are intact. No gross focal neurological deficits. Sensation intact. Reflexes intact.  SKIN: No ulceration, lesions, rashes, or cyanosis. Skin warm and dry. Turgor intact.  PSYCHIATRIC: Mood, affect within normal limits. The patient is awake, alert and oriented x 3. Insight, judgment intact.      LABORATORY PANEL:   CBC  Recent Labs Lab 08/24/15 0509  WBC 9.1  HGB 12.4  HCT 37.0  PLT 131*   ------------------------------------------------------------------------------------------------------------------  Chemistries   Recent Labs Lab 08/23/15 1519  08/25/15 0325  NA 138  < > 142  K 3.4*  < > 3.6  CL 100*  < > 105  CO2 28  < > 30  GLUCOSE 115*  < > 109*  BUN 10  < > 10  CREATININE 0.88  < > 0.82  CALCIUM 9.1  < > 8.7*  AST 35  --   --   ALT 27  --   --   ALKPHOS 81  --   --   BILITOT <0.1*  --   --   < > =  values in this interval not displayed. ------------------------------------------------------------------------------------------------------------------  Cardiac Enzymes  Recent Labs Lab 08/24/15 0509  TROPONINI <0.03   ------------------------------------------------------------------------------------------------------------------  RADIOLOGY:  Ct Chest Wo Contrast  08/25/2015  CLINICAL DATA:   Pulmonary nodules by chest x-ray 08/23/2015. History of metastatic uterine cancer. EXAM: CT CHEST WITHOUT CONTRAST TECHNIQUE: Multidetector CT imaging of the chest was performed following the standard protocol without IV contrast. COMPARISON:  08/23/2015, 07/23/2015, 01/30/2008 FINDINGS: Mediastinum/Lymph Nodes: Atherosclerosis of the thoracic aorta noted. No definite mediastinal or hilar adenopathy within the limits of noncontrast imaging. Normal heart size. No pericardial effusion. Lungs/Pleura: Numerous bilateral new pulmonary nodules throughout all lobes of both lungs. These are new compared to 01/30/2008 chest CT. Nodules are compatible with diffuse pulmonary metastases. Index peripheral right lower lobe pulmonary nodule measures 16 mm, image 80. Index left upper lobe pulmonary nodule measures 10 mm, image 33. No superimposed edema, collapse or consolidation. No focal pneumonia or airspace process. No pleural abnormality, effusion, or pneumothorax. Trachea and central airways remain patent. Upper abdomen: Numerous hypodense lesions throughout the imaged portion of the liver compatible with extensive hepatic metastases. No biliary dilatation. Gallbladder is collapsed. No upper abdominal adenopathy. Musculoskeletal: Minor thoracic degenerative change. No acute compression fracture. Thoracic spine and sternum appear intact. IMPRESSION: Innumerable bilateral pulmonary metastases involving all lobes of both lungs. Evidence of extensive diffuse hepatic metastases as well without biliary obstruction or dilatation. Thoracic aortic atherosclerosis Electronically Signed   By: Jerilynn Mages.  Shick M.D.   On: 08/25/2015 14:42    EKG:   Orders placed or performed during the hospital encounter of 08/23/15  . ED EKG  . ED EKG  . EKG 12-Lead  . EKG 12-Lead    ASSESSMENT AND PLAN:   Annette [name] is a 55 y.o. female presenting with Abdominal Pain . 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   #3 hypertension-on lisinopril. Hydrochlorothiazide on hold.  #4 diabetes mellitus-metformin is on hold. On sliding scale insulin.  #5 chronic pain syndrome-follows with pain management clinic. Nucynta is on hold as patient is on OxyContin and oxycodone. Also on gabapentin.  #6 DVT prophylaxis-continue Lovenox.  Encourage ambulation today. Anticipate discharge tomorrow if pain controlled.   All the records are reviewed and case discussed with Care Management/Social Workerr. Management plans discussed with the patient, family and they are in agreement.  CODE STATUS: full TOTAL TIME TAKING CARE OF THIS PATIENT: 28 minutes.   POSSIBLE D/C IN 1DAYS, DEPENDING ON CLINICAL CONDITION.   Kristyn Obyrne,  Karenann Cai.D on 08/26/2015 at 1:03 PM  Between 7am to 6pm - Pager - [phone]  After 6pm: House Pager: - [phone]  [facility]  Office  [phone]  CC: Primary care physician; Ellamae Sia, MD

## 2015-08-26 NOTE — Progress Notes (Signed)
Per pain management patient is on nucenta 150 bid -- roughly equivalent to 36mg  oxy ER BID Changes made, if pain controlled can discharge today

## 2015-08-26 NOTE — Patient Instructions (Signed)
Temsirolimus injection What is this medicine? TEMSIROLIMUS (TEM sir OH li mus) is a drug that alters immune system response in the body. It is used to treat renal cell cancer. This medicine may be used for other purposes; ask your health care provider or pharmacist if you have questions. What should I tell my health care provider before I take this medicine? They need to know if you have any of these conditions: -diabetes -heart disease -high cholesterol -immune system problems -infection (especially a virus infection such as chickenpox, cold sores, or herpes) -liver disease -low blood counts, like low white cell, platelet, or red cell counts -lung or breathing disease, like asthma -take medicines that treat or prevent blood clots -an unusual or allergic reaction to temsirolimus, polysorbate 80, other medicines, foods, dyes, or preservatives -pregnant or trying to get pregnant -breast-feeding How should I use this medicine? This medicine is for infusion into a vein. It is given by a health care professional in a hospital or clinic setting. Talk to your pediatrician regarding the use of this medicine in children. Special care may be needed. Overdosage: If you think you have taken too much of this medicine contact a poison control center or emergency room at once. NOTE: This medicine is only for you. Do not share this medicine with others. What if I miss a dose? It is important not to miss your dose. Call your doctor or health care professional if you are unable to keep an appointment. What may interact with this medicine? Do not take this medicine with any of the following medications: -grapefruit juice -St. John's Wort This medicine may also interact with the following medications: -carbamazepine -dexamethasone -nefazodone -phenobarbital -phenytoin -medicines for heart or kidney problems like captopril, lisinopril -medicines for infection like clarithromycin, itraconazole,  ketoconazole, rifabutin, rifampin, telithromycin -some medicines for HIV like atazanavir, indinavir, nelfinavir, ritonavir, saquinavir -sunitinib -vaccines This list may not describe all possible interactions. Give your health care provider a list of all the medicines, herbs, non-prescription drugs, or dietary supplements you use. Also tell them if you smoke, drink alcohol, or use illegal drugs. Some items may interact with your medicine. What should I watch for while using this medicine? Visit your doctor for regular check-ups. you will need important blood work done while you are taking this medicine. This drug may make you feel generally unwell. This is not uncommon, as chemotherapy can affect healthy cells as well as cancer cells. Report any side effects. Continue your course of treatment even though you feel ill unless your doctor tells you to stop. Call your doctor or health care professional for advice if you get a fever, chills or sore throat, or other symptoms of a cold or flu. Do not treat yourself. This drug decreases your body's ability to fight infections. Try to avoid being around people who are sick. This medicine may increase your risk to bruise or bleed. Call your doctor or health care professional if you notice any unusual bleeding. Be careful brushing and flossing your teeth or using a toothpick because you may get an infection or bleed more easily. If you have any dental work done, tell your dentist you are receiving this medicine. Avoid taking products that contain aspirin, acetaminophen, ibuprofen, naproxen, or ketoprofen unless instructed by your doctor. These medicines may hide a fever. Do not become pregnant while taking this medicine. Women should inform their doctor if they wish to become pregnant or think they might be pregnant. There is a potential for serious  side effects to an unborn child. Women who are able to have children should use effective birth control before,  during, and for 12 weeks after stopping this medicine. Talk to your health care professional or pharmacist for more information. Do not breast-feed an infant while taking this medicine. What side effects may I notice from receiving this medicine? Side effects that you should report to your doctor or health care professional as soon as possible: -allergic reactions like skin rash, itching or hives, swelling of the face, lips, or tongue -breathing problems, cough -chest pain -dizziness -fever or chills, sore throat -hallucination, loss of contact with reality -increased hunger or thirst -increased urination -pain, swelling, warmth in the leg -redness, blistering, peeling or loosening of the skin, including inside the mouth -seizures -swelling of the legs or ankles -trouble passing urine or change in the amount of urine -unusual bleeding or bruising -unusually weak or tired Side effects that usually do not require medical attention (report to your prescriber or health care professional if they continue or are bothersome): -constipation -loss of appetite -mouth sores -nausea, vomiting -stomach pain This list may not describe all possible side effects. Call your doctor for medical advice about side effects. You may report side effects to FDA at 1-800-FDA-1088. Where should I keep my medicine? This drug is given in a hospital or clinic and will not be stored at home. NOTE: This sheet is a summary. It may not cover all possible information. If you have questions about this medicine, talk to your doctor, pharmacist, or health care provider.    2016, [employer]. (2007-10-21 16:31:28)

## 2015-08-26 NOTE — Progress Notes (Signed)
Annette [name]   DOB:23-Nov-1960   [id number]    Subjective: Patient continues to complain of back pain. Also complains of abdominal pain.  ROS: Episodes of anxiety. Otherwise no nausea or vomiting.  Objective:  Filed Vitals:   08/25/15 2029 08/26/15 0437  BP: 130/77 147/85  Pulse: 71 71  Temp: 98.5 F (36.9 C) 98.3 F (36.8 C)  Resp: 20 18     Intake/Output Summary (Last 24 hours) at 08/26/15 0754 Last data filed at 08/25/15 1900  Gross per 24 hour  Intake    600 ml  Output      0 ml  Net    600 ml    GENERAL: Well-nourished well-developed; Alert, no distress and comfortable. Obese. She is alone. EYES: no pallor or icterus OROPHARYNX: no thrush or ulceration. NECK: supple, no masses felt LYMPH: no palpable lymphadenopathy in the cervical, axillary or inguinal regions LUNGS: decreased breath sounds to auscultation at bases and No wheeze or crackles HEART/CVS: regular rate & rhythm and no murmurs; No lower extremity edema ABDOMEN: abdomen soft, non-tender and normal bowel sounds Musculoskeletal:no cyanosis of digits and no clubbing  PSYCH: alert & oriented x 3 with fluent speech NEURO: no focal motor/sensory deficits- except for mild weakness of the right lower extremity/foot drop [chronic.] SKIN: no rashes or significant lesions   Labs:  Lab Results  Component Value Date   WBC 9.1 08/24/2015   HGB 12.4 08/24/2015   HCT 37.0 08/24/2015   MCV 89.8 08/24/2015   PLT 131* 08/24/2015   NEUTROABS 5.0 07/28/2015    Lab Results  Component Value Date   NA 142 08/25/2015   K 3.6 08/25/2015   CL 105 08/25/2015   CO2 30 08/25/2015    Studies:  Ct Chest Wo Contrast  08/25/2015  CLINICAL DATA:  Pulmonary nodules by chest x-ray 08/23/2015. History of metastatic uterine cancer. EXAM: CT CHEST WITHOUT CONTRAST TECHNIQUE: Multidetector CT imaging of the chest was performed following the standard protocol without IV contrast. COMPARISON:  08/23/2015, 07/23/2015, 01/30/2008  FINDINGS: Mediastinum/Lymph Nodes: Atherosclerosis of the thoracic aorta noted. No definite mediastinal or hilar adenopathy within the limits of noncontrast imaging. Normal heart size. No pericardial effusion. Lungs/Pleura: Numerous bilateral new pulmonary nodules throughout all lobes of both lungs. These are new compared to 01/30/2008 chest CT. Nodules are compatible with diffuse pulmonary metastases. Index peripheral right lower lobe pulmonary nodule measures 16 mm, image 80. Index left upper lobe pulmonary nodule measures 10 mm, image 33. No superimposed edema, collapse or consolidation. No focal pneumonia or airspace process. No pleural abnormality, effusion, or pneumothorax. Trachea and central airways remain patent. Upper abdomen: Numerous hypodense lesions throughout the imaged portion of the liver compatible with extensive hepatic metastases. No biliary dilatation. Gallbladder is collapsed. No upper abdominal adenopathy. Musculoskeletal: Minor thoracic degenerative change. No acute compression fracture. Thoracic spine and sternum appear intact. IMPRESSION: Innumerable bilateral pulmonary metastases involving all lobes of both lungs. Evidence of extensive diffuse hepatic metastases as well without biliary obstruction or dilatation. Thoracic aortic atherosclerosis Electronically Signed   By: Jerilynn Mages.  Shick M.D.   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 Reviewed the CT of the lungs multiple lesions noted. Plan start Torisel weekly; starting next  week.   # Worsening of chronic pain- patient has multiple worsening lesions in the liver- which could potentially explain her worsening pain in the abdomen.   Discussed with Dr. crisp patient's pain physician- recommend continued on pain medication of Nugenta 150 mg BID; but increase the frequency of oxycodone 10 mg every 6 hours.   # Patient's case was also discussed the tumor conference; recommend [facility] testing for TSC-2 gene mutation.   # Also spoke to Dr. Lavetta Nielsen; the patient may potentially be discharged from oncology standpoint.  Cammie Sickle, MD 08/26/2015  7:54 AM

## 2015-08-27 ENCOUNTER — Encounter: Payer: Self-pay | Admitting: *Deleted

## 2015-08-27 DIAGNOSIS — G893 Neoplasm related pain (acute) (chronic): Secondary | ICD-10-CM | POA: Diagnosis not present

## 2015-08-27 LAB — URINALYSIS COMPLETE WITH MICROSCOPIC (ARMC ONLY)
BILIRUBIN URINE: NEGATIVE
Glucose, UA: NEGATIVE mg/dL
Ketones, ur: NEGATIVE mg/dL
LEUKOCYTES UA: NEGATIVE
NITRITE: NEGATIVE
PH: 6 (ref 5.0–8.0)
PROTEIN: NEGATIVE mg/dL
Specific Gravity, Urine: 1.008 (ref 1.005–1.030)

## 2015-08-27 LAB — GLUCOSE, CAPILLARY
GLUCOSE-CAPILLARY: 122 mg/dL — AB (ref 65–99)
Glucose-Capillary: 109 mg/dL — ABNORMAL HIGH (ref 65–99)
Glucose-Capillary: 167 mg/dL — ABNORMAL HIGH (ref 65–99)
Glucose-Capillary: 128 mg/dL — ABNORMAL HIGH (ref 65–99)

## 2015-08-27 NOTE — Progress Notes (Signed)
[facility] [place] NAME: Annette [name]    MRN#:  [id number]  DATE OF BIRTH:  Jun 12, 1960  SUBJECTIVE:  Hospital Day: none Annette [name] is a 55 y.o. female presenting with Abdominal Pain .   Overnight events: No overnight events Interval Events: confused this morning "was I blindfolded when they brought me?"  REVIEW OF SYSTEMS:  CONSTITUTIONAL: No fever, fatigue or weakness.  EYES: No blurred or double vision.  EARS, NOSE, AND THROAT: No tinnitus or ear pain.  RESPIRATORY: No cough, shortness of breath, wheezing or hemoptysis.  CARDIOVASCULAR: No chest pain, orthopnea, edema.  GASTROINTESTINAL: No nausea, vomiting, diarrhea or abdominal pain.  GENITOURINARY: No dysuria, hematuria.  ENDOCRINE: No polyuria, nocturia,  HEMATOLOGY: No anemia, easy bruising or bleeding SKIN: No rash or lesion. MUSCULOSKELETAL: No joint pain or arthritis.   NEUROLOGIC: No tingling, numbness, weakness.  PSYCHIATRY: No anxiety or depression.   DRUG ALLERGIES:   Allergies  Allergen Reactions  . Iodides Swelling  . Iodine Swelling  . Shellfish Allergy Swelling    shrimp causes swelling  . Shellfish-Derived Products Swelling  . Iodinated Diagnostic Agents Swelling  . Povidone Iodine Swelling    eyes  . Povidone-Iodine Swelling    VITALS:  Blood pressure 134/72, pulse 87, temperature 98.7 F (37.1 C), temperature source Oral, resp. rate 20, height 5\' 5"  (1.651 m), weight 293 lb (132.904 kg), SpO2 95 %.  PHYSICAL EXAMINATION:  VITAL SIGNS: Filed Vitals:   08/27/15 0444 08/27/15 0854  BP: 131/86 134/72  Pulse: 92 87  Temp: 98.7 F (37.1 C)   Resp: 38    GENERAL:54 y.o.female currently in no acute distress.  HEAD: Normocephalic, atraumatic.  EYES: Pupils equal, round, reactive to light. Extraocular muscles intact. No scleral icterus.  MOUTH: Moist mucosal membrane. Dentition intact. No abscess noted.  EAR, NOSE, THROAT: Clear without  exudates. No external lesions.  NECK: Supple. No thyromegaly. No nodules. No JVD.  PULMONARY: Clear to ascultation, without wheeze rails or rhonci. No use of accessory muscles, Good respiratory effort. good air entry bilaterally CHEST: Nontender to palpation.  CARDIOVASCULAR: S1 and S2. Regular rate and rhythm. No murmurs, rubs, or gallops. No edema. Pedal pulses 2+ bilaterally.  GASTROINTESTINAL: Soft, nontender, nondistended. No masses. Positive bowel sounds. No hepatosplenomegaly.  MUSCULOSKELETAL: No swelling, clubbing, or edema. Range of motion full in all extremities.  NEUROLOGIC: Cranial nerves II through XII are intact. No gross focal neurological deficits. Sensation intact. Reflexes intact.  SKIN: No ulceration, lesions, rashes, or cyanosis. Skin warm and dry. Turgor intact.  PSYCHIATRIC:confused  Mood, affect within normal limits. The patient is awake, alert and oriented x 3. Insight, judgment intact.      LABORATORY PANEL:   CBC  Recent Labs Lab 08/24/15 0509  WBC 9.1  HGB 12.4  HCT 37.0  PLT 131*   ------------------------------------------------------------------------------------------------------------------  Chemistries   Recent Labs Lab 08/23/15 1519  08/25/15 0325  NA 138  < > 142  K 3.4*  < > 3.6  CL 100*  < > 105  CO2 28  < > 30  GLUCOSE 115*  < > 109*  BUN 10  < > 10  CREATININE 0.88  < > 0.82  CALCIUM 9.1  < > 8.7*  AST 35  --   --   ALT 27  --   --   ALKPHOS 81  --   --   BILITOT <0.1*  --   --   < > =  values in this interval not displayed. ------------------------------------------------------------------------------------------------------------------  Cardiac Enzymes  Recent Labs Lab 08/24/15 0509  TROPONINI <0.03   ------------------------------------------------------------------------------------------------------------------  RADIOLOGY:  Ct Chest Wo Contrast  08/25/2015  CLINICAL DATA:  Pulmonary nodules by chest x-ray  08/23/2015. History of metastatic uterine cancer. EXAM: CT CHEST WITHOUT CONTRAST TECHNIQUE: Multidetector CT imaging of the chest was performed following the standard protocol without IV contrast. COMPARISON:  08/23/2015, 07/23/2015, 01/30/2008 FINDINGS: Mediastinum/Lymph Nodes: Atherosclerosis of the thoracic aorta noted. No definite mediastinal or hilar adenopathy within the limits of noncontrast imaging. Normal heart size. No pericardial effusion. Lungs/Pleura: Numerous bilateral new pulmonary nodules throughout all lobes of both lungs. These are new compared to 01/30/2008 chest CT. Nodules are compatible with diffuse pulmonary metastases. Index peripheral right lower lobe pulmonary nodule measures 16 mm, image 80. Index left upper lobe pulmonary nodule measures 10 mm, image 33. No superimposed edema, collapse or consolidation. No focal pneumonia or airspace process. No pleural abnormality, effusion, or pneumothorax. Trachea and central airways remain patent. Upper abdomen: Numerous hypodense lesions throughout the imaged portion of the liver compatible with extensive hepatic metastases. No biliary dilatation. Gallbladder is collapsed. No upper abdominal adenopathy. Musculoskeletal: Minor thoracic degenerative change. No acute compression fracture. Thoracic spine and sternum appear intact. IMPRESSION: Innumerable bilateral pulmonary metastases involving all lobes of both lungs. Evidence of extensive diffuse hepatic metastases as well without biliary obstruction or dilatation. Thoracic aortic atherosclerosis Electronically Signed   By: Jerilynn Mages.  Shick M.D.   On: 08/25/2015 14:42    EKG:   Orders placed or performed during the hospital encounter of 08/23/15  . ED EKG  . ED EKG  . EKG 12-Lead  . EKG 12-Lead    ASSESSMENT AND PLAN:   Annette [name] is a 55 y.o. female presenting with Abdominal Pain . Admitted 08/23/2015 : Day #: none   #1 acute on chronic abdominal pain Follows with pain clinic, Nucynta as  outpatient 150 mg twice a day as well as as needed oxycodone-i Continue OxyContin , it sounds like the patient ran out of Nucynta prior to arrival hospital  2. Confusion: check Ua, don't feel its in relation to medication  #2 metastatic malignancy-biopsy from liver lesion showing PEComa- - worsening of lung and liver lesions when compared to last month CT abdomen -CT chest for staging today - Oncology consulted-outpatient follow-up for chemotherapy options.   #3 hypertension-on lisinopril. Hydrochlorothiazide on hold.  #4 diabetes mellitus-metformin is on hold. On sliding scale insulin.  #5 chronic pain syndrome-follows with pain management clinic. Nucynta is on hold as patient is on OxyContin and oxycodone. Also on gabapentin.  #6 DVT prophylaxis-continue Lovenox.  Addendum: patients mental status returned to baseline later in the day   All the records are reviewed and case discussed with Care Management/Social Workerr. Management plans discussed with the patient, family and they are in agreement.  CODE STATUS: full TOTAL TIME TAKING CARE OF THIS PATIENT: 33 minutes.   POSSIBLE D/C IN 1DAYS, DEPENDING ON CLINICAL CONDITION.   Hower,  Karenann Cai.D on 08/27/2015 at 11:55 AM  Between 7am to 6pm - Pager - [phone]  After 6pm: House Pager: - [phone]  [facility]  Office  [phone]  CC: Primary care physician; Ellamae Sia, MD

## 2015-08-27 NOTE — Progress Notes (Signed)
This morning while rounding, patient was observed standing outside of assigned room looking confused. Patient was directed back to room. Patient alert but states she is unsure where she is or why she is here. Dr. Lavetta Nielsen was on the floor and came in to see the patient. Patient then answered orientation questions appropriately but continued to act "odd". Urinalysis ordered. Will continue to closely monitor.

## 2015-08-27 NOTE — Progress Notes (Signed)
Faxed form for [facility] per MD request for patient's pathology (colleciton date-dated 08/02/15).

## 2015-08-28 DIAGNOSIS — G893 Neoplasm related pain (acute) (chronic): Secondary | ICD-10-CM | POA: Diagnosis not present

## 2015-08-28 LAB — CREATININE, SERUM
Creatinine, Ser: 0.83 mg/dL (ref 0.44–1.00)
GFR calc Af Amer: >60 mL/min (ref >60)
GFR calc non Af Amer: >60 mL/min (ref >60)

## 2015-08-28 LAB — GLUCOSE, CAPILLARY: GLUCOSE-CAPILLARY: 141 mg/dL — AB (ref 65–99)

## 2015-08-28 LAB — HEMOGLOBIN: HEMOGLOBIN: 12.4 g/dL (ref 12.0–16.0)

## 2015-08-28 MED ORDER — OXYCODONE HCL 10 MG PO TABS: 10.0000 mg | ORAL_TABLET | ORAL | Status: DC | PRN

## 2015-08-28 MED ORDER — HYDROMORPHONE HCL 1 MG/ML IJ SOLN
1.0000 mg | Freq: Once | INTRAMUSCULAR | Status: AC
  Administered 2015-08-28: 1 mg via INTRAVENOUS
  Filled 2015-08-28: qty 1

## 2015-08-28 MED ORDER — SILVER SULFADIAZINE 1 % EX CREA: TOPICAL_CREAM | Freq: Every day | CUTANEOUS | Status: DC

## 2015-08-28 MED ORDER — SENNOSIDES-DOCUSATE SODIUM 8.6-50 MG PO TABS: 2.0000 | ORAL_TABLET | Freq: Every day | ORAL | Status: DC

## 2015-08-28 MED ORDER — ALPRAZOLAM 0.5 MG PO TABS
0.5000 mg | ORAL_TABLET | Freq: Once | ORAL | Status: AC
  Administered 2015-08-28: 04:00:00 0.5 mg via ORAL
  Filled 2015-08-28: qty 1

## 2015-08-28 NOTE — Discharge Summary (Signed)
[facility] [place] NAME: Annette [name]    MR#:  [id number]  DATE OF BIRTH:  1960-02-14  DATE OF ADMISSION:  08/23/2015 ADMITTING PHYSICIAN: Lance Coon, MD  DATE OF DISCHARGE: 08/28/2015  9:15 AM  PRIMARY CARE PHYSICIAN: Ellamae Sia, MD    ADMISSION DIAGNOSIS:  Metastatic cancer ([place]) [C80.1] Pain [R52] Generalized abdominal pain [R10.84] Chest pain, unspecified chest pain type [R07.9]  DISCHARGE DIAGNOSIS:  Principal Problem:   Intractable pain Active Problems:   Malignant neoplasm of overlapping sites of body of uterus (HCC)   HTN (hypertension)   HLD (hyperlipidemia)   Sore [name] leg   SECONDARY DIAGNOSIS:   Past Medical History  Diagnosis Date  . Hypertension   . Depression   . Peripheral neuropathy ([place])   . Renal insufficiency   . Diabetes mellitus without complication ([place])   . Onychomycosis   . Obesity   . DDD (degenerative disc disease), lumbar   . Menopausal syndrome   . Asthma   . Back pain   . Hyperlipidemia   . Insomnia     HOSPITAL COURSE:  55 year old female with newly diagnosed. perivascular epithelioid malignancy of the  uterus with metastatic disease to liver and lungs who is admitted for worsening of chronic abdominal pain.  1. Acute [name] chronic pain: Patient has multiple worsening lesions in the liver which could potentially explain worsening pain in abdomen. Oncology was consulted. Oncology spoke with patient's pain physician who recommended continuing the gentamicin and increase breakthrough oxycodone. She'll follow up with pain clinician next week.  2.Perivascular epithelioid malignancy of the uterus: With metastatic disease to lung and liver. As per oncology plan to start Torisel weekly; starting next week.  3. Confusion: Patient had episode of confusion however seems to be more appropriate this morning. Patient may benefit from head CT to evaluate for metastatic disease when she follows up with  oncology.  4. Essential hypertension: Patient may resume her outpatient medications.  5. Diabetes: Patient may resume ADA diet and outpatient regimen. DISCHARGE CONDITIONS AND DIET:   Stable for discharge [name] diabetic diet  CONSULTS OBTAINED:  Treatment Team:  Cammie Sickle, MD  DRUG ALLERGIES:   Allergies  Allergen Reactions  . Iodides Swelling  . Iodine Swelling  . Shellfish Allergy Swelling    shrimp causes swelling  . Shellfish-Derived Products Swelling  . Iodinated Diagnostic Agents Swelling  . Povidone Iodine Swelling    eyes  . Povidone-Iodine Swelling    DISCHARGE MEDICATIONS:   Discharge Medication List as of 08/28/2015  8:01 AM    START taking these medications   Details  senna-docusate (SENOKOT-S) 8.6-50 MG tablet Take 2 tablets by mouth daily., Starting 08/28/2015, Until Discontinued, Normal    silver sulfADIAZINE (SILVADENE) 1 % cream Apply topically daily., Starting 08/28/2015, Until Discontinued, Normal      CONTINUE these medications which have CHANGED   Details  oxyCODONE 10 MG TABS Take 1 tablet (10 mg total) by mouth every 4 (four) hours as needed for breakthrough pain., Starting 08/28/2015, Until Discontinued, Print      CONTINUE these medications which have NOT CHANGED   Details  albuterol (PROAIR HFA) 108 (90 Base) MCG/ACT inhaler Inhale 1-2 puffs into the lungs every 6 (six) hours as needed for wheezing. , Until Discontinued, Historical Med    aspirin EC 81 MG tablet Take 81 mg by mouth., Until Discontinued, Historical Med    beclomethasone (QVAR) 40 MCG/ACT inhaler Inhale 2 puffs into  the lungs 2 (two) times daily. Reported [name] 05/26/2015, Until Discontinued, Historical Med    fluticasone (FLONASE) 50 MCG/ACT nasal spray Place 1 spray into both nostrils daily. , Until Discontinued, Historical Med    gabapentin (NEURONTIN) 600 MG tablet Take 600 mg by mouth 2 (two) times daily. , Until Discontinued, Historical Med    hydrochlorothiazide  (HYDRODIURIL) 25 MG tablet Take 25 mg by mouth daily. , Until Discontinued, Historical Med    ibuprofen (ADVIL,MOTRIN) 800 MG tablet Take 800 mg by mouth every 8 (eight) hours as needed for mild pain or moderate pain. , Until Discontinued, Historical Med    lactulose (CHRONULAC) 10 GM/15ML solution Take 30 g by mouth 2 (two) times daily as needed for mild constipation or moderate constipation. , Until Discontinued, Historical Med    lisinopril (PRINIVIL,ZESTRIL) 10 MG tablet Take 10 mg by mouth daily. , Until Discontinued, Historical Med    metFORMIN (GLUCOPHAGE-XR) 500 MG 24 hr tablet Take 500 mg by mouth daily with breakfast., Until Discontinued, Historical Med    naloxone (NARCAN) 0.4 MG/ML injection Inject 0.4 mg into the vein as needed., Until Discontinued, Historical Med    ondansetron (ZOFRAN) 4 MG tablet Take 4 mg by mouth every 8 (eight) hours as needed for nausea or vomiting. , Until Discontinued, Historical Med    pantoprazole (PROTONIX) 40 MG tablet Take 40 mg by mouth daily. , Until Discontinued, Historical Med    pravastatin (PRAVACHOL) 40 MG tablet Take 40 mg by mouth daily. , Until Discontinued, Historical Med    PREMARIN vaginal cream Place 1 Applicatorful vaginally at bedtime. , Until Discontinued, Historical Med    Tapentadol HCl (NUCYNTA ER) 150 MG TB12 Limit 1 tab by mouth  every 12 hours IF tolerated (note pill is now 150 mg and much stronger), Print    tiZANidine (ZANAFLEX) 2 MG tablet Take 2 mg by mouth every 6 (six) hours as needed for muscle spasms. , Until Discontinued, Historical Med    traZODone (DESYREL) 150 MG tablet Take 150 mg by mouth 2 (two) times daily. , Until Discontinued, Historical Med    Vilazodone HCl (VIIBRYD) 40 MG TABS Take 40 mg by mouth daily. , Until Discontinued, Historical Med              Today   CHIEF COMPLAINT:  Abdominal pain is about the same but patient wants to go home. Patient had large bowel movement  yesterday.   VITAL SIGNS:  Blood pressure 175/84, pulse 90, temperature 98.4 F (36.9 C), temperature source Oral, resp. rate 20, height 5\' 5"  (1.651 m), weight 132.904 kg (293 lb), SpO2 98 %.   REVIEW OF SYSTEMS:  Review of Systems  Constitutional: Negative for fever, chills and malaise/fatigue.  HENT: Negative for ear discharge, ear pain, hearing loss, nosebleeds and sore throat.   Eyes: Negative for blurred vision and pain.  Respiratory: Negative for cough, hemoptysis, shortness of breath and wheezing.   Cardiovascular: Negative for chest pain, palpitations and leg swelling.  Gastrointestinal: Positive for abdominal pain. Negative for nausea, vomiting, diarrhea and blood in stool.  Genitourinary: Negative for dysuria.  Musculoskeletal: Negative for back pain.  Neurological: Negative for dizziness, tremors, speech change, focal weakness, seizures and headaches.  Endo/Heme/Allergies: Does not bruise/bleed easily.  Psychiatric/Behavioral: Negative for depression, suicidal ideas and hallucinations.     PHYSICAL EXAMINATION:  GENERAL:  55 y.o.-year-old patient lying in the bed with no acute distress.  NECK:  Supple, no jugular venous distention. No thyroid enlargement, no  tenderness.  LUNGS: Normal breath sounds bilaterally, no wheezing, rales,rhonchi  No use of accessory muscles of respiration.  CARDIOVASCULAR: S1, S2 normal. No murmurs, rubs, or gallops.  ABDOMEN: Soft, non-tender, non-distended. Bowel sounds present. No organomegaly or mass.  EXTREMITIES: No pedal edema, cyanosis, or clubbing.  PSYCHIATRIC: The patient is alert and oriented x 3.  SKIN: No obvious rash, lesion, or ulcer.   DATA REVIEW:   CBC  Recent Labs Lab 08/24/15 0509 08/28/15 0642  WBC 9.1  --   HGB 12.4 12.4  HCT 37.0  --   PLT 131*  --     Chemistries   Recent Labs Lab 08/23/15 1519  08/25/15 0325 08/28/15 0642  NA 138  < > 142  --   K 3.4*  < > 3.6  --   CL 100*  < > 105  --   CO2 28   < > 30  --   GLUCOSE 115*  < > 109*  --   BUN 10  < > 10  --   CREATININE 0.88  < > 0.82 0.83  CALCIUM 9.1  < > 8.7*  --   AST 35  --   --   --   ALT 27  --   --   --   ALKPHOS 81  --   --   --   BILITOT <0.1*  --   --   --   < > = values in this interval not displayed.  Cardiac Enzymes  Recent Labs Lab 08/23/15 1521 08/24/15 0509  TROPONINI <0.03 <0.03    Microbiology Results  @[email or web address] @  RADIOLOGY:  No results found.    Management plans discussed with the patient and she is in agreement. Discussed with family at bedside. Stable for discharge home  Patient should follow up with oncology next week  CODE STATUS:     Code Status Orders        Start     Ordered   08/24/15 0210  Full code   Continuous     08/24/15 0209    Code Status History    Date Active Date Inactive Code Status Order ID Comments User Context   This patient has a current code status but no historical code status.      TOTAL TIME TAKING CARE OF THIS PATIENT: 37 minutes.    Note: This dictation was prepared with Dragon dictation along with smaller phrase technology. Any transcriptional errors that result from this process are unintentional.  Maleeya Peterkin M.D [name] 08/28/2015 at 11:03 AM  Between 7am to 6pm - Pager - [phone] After 6pm go to [email or web address] - password [email or web address] [facility]  Office  [phone]  CC: Primary care physician; Ellamae Sia, MD

## 2015-08-28 NOTE — Progress Notes (Signed)
Pt verbalizing desire with son and dgt with pt. Pt ambulating in hall frequently. Oxycodone required for breakthrough pain. Noted pt had difficulty with memory recall, forget what she was doing, delayed response; admits she is having difficulty staying focused and feels mildly confused at times. Pt and family educated on need for supervision of activity and medications; stressed importance of supervision of controlled substance use with stated understanding. Suggested to use pill box and write down time each dose of pain medication. Oral and written AVS done with pt and family. Dr. Benjie Karvonen paged to clarify long acting pain med use prescription with pt to resume her home med. Oxycodone IR 10 mg po every 4 hour prn rx given to pt/family. Ready for discharge home accompanied by family via [place] to private vehicle.

## 2015-08-28 NOTE — Plan of Care (Signed)
Problem: Skin Integrity: Goal: Risk for impaired skin integrity will decrease Outcome: Adequate for Discharge Burn wound progressively healing. Pt refused wound care this a.m with pt and family able to repeat back correct care. Silvadene and some dressing materials sent with pt for today.

## 2015-08-28 NOTE — Plan of Care (Signed)
Problem: Pain Managment: Goal: General experience of comfort will improve Outcome: Not Progressing Patient presents with intractable abdominal pain, aching and cramping in nature that does not ease despite of scheduled and PRN pain medicine. Had to call for a one time dose of dilaudid 1mg  at 0230, administered but Patient verbalized that pain is still at the same level 8/10.  Rest and comforts provided.

## 2015-08-30 ENCOUNTER — Emergency Department: Payer: Medicare Other

## 2015-08-30 ENCOUNTER — Encounter: Payer: Self-pay | Admitting: Emergency Medicine

## 2015-08-30 ENCOUNTER — Inpatient Hospital Stay: Payer: Medicare Other

## 2015-08-30 ENCOUNTER — Emergency Department
Admission: EM | Admit: 2015-08-30 | Discharge: 2015-08-30 | Disposition: A | Discharge status: Home / Self Care | Payer: Medicare Other | Attending: Emergency Medicine | Admitting: Emergency Medicine

## 2015-08-30 ENCOUNTER — Inpatient Hospital Stay: Payer: Medicare Other | Admitting: Internal Medicine

## 2015-08-30 DIAGNOSIS — F17210 Nicotine dependence, cigarettes, uncomplicated: Secondary | ICD-10-CM | POA: Diagnosis not present

## 2015-08-30 DIAGNOSIS — R410 Disorientation, unspecified: Secondary | ICD-10-CM | POA: Insufficient documentation

## 2015-08-30 DIAGNOSIS — R1084 Generalized abdominal pain: Secondary | ICD-10-CM

## 2015-08-30 DIAGNOSIS — Z7982 Long term (current) use of aspirin: Secondary | ICD-10-CM | POA: Diagnosis not present

## 2015-08-30 DIAGNOSIS — J45909 Unspecified asthma, uncomplicated: Secondary | ICD-10-CM | POA: Diagnosis not present

## 2015-08-30 DIAGNOSIS — Z791 Long term (current) use of non-steroidal anti-inflammatories (NSAID): Secondary | ICD-10-CM | POA: Insufficient documentation

## 2015-08-30 DIAGNOSIS — I10 Essential (primary) hypertension: Secondary | ICD-10-CM | POA: Diagnosis not present

## 2015-08-30 DIAGNOSIS — Z79899 Other long term (current) drug therapy: Secondary | ICD-10-CM | POA: Diagnosis not present

## 2015-08-30 DIAGNOSIS — M549 Dorsalgia, unspecified: Secondary | ICD-10-CM | POA: Diagnosis not present

## 2015-08-30 DIAGNOSIS — C7989 Secondary malignant neoplasm of other specified sites: Secondary | ICD-10-CM | POA: Insufficient documentation

## 2015-08-30 DIAGNOSIS — E785 Hyperlipidemia, unspecified: Secondary | ICD-10-CM | POA: Diagnosis not present

## 2015-08-30 DIAGNOSIS — C549 Malignant neoplasm of corpus uteri, unspecified: Secondary | ICD-10-CM | POA: Diagnosis not present

## 2015-08-30 DIAGNOSIS — Z7984 Long term (current) use of oral hypoglycemic drugs: Secondary | ICD-10-CM | POA: Diagnosis not present

## 2015-08-30 DIAGNOSIS — E119 Type 2 diabetes mellitus without complications: Secondary | ICD-10-CM | POA: Diagnosis not present

## 2015-08-30 DIAGNOSIS — F329 Major depressive disorder, single episode, unspecified: Secondary | ICD-10-CM | POA: Insufficient documentation

## 2015-08-30 DIAGNOSIS — C799 Secondary malignant neoplasm of unspecified site: Secondary | ICD-10-CM

## 2015-08-30 LAB — CBC WITH DIFFERENTIAL/PLATELET
BAND NEUTROPHILS: 0 %
BASOS ABS: 0 10*3/uL (ref 0–0.1)
BASOS PCT: 0 %
Blasts: 0 %
EOS ABS: 0 10*3/uL (ref 0–0.7)
EOS PCT: 0 %
HCT: 39.4 % (ref 35.0–47.0)
Hemoglobin: 12.7 g/dL (ref 12.0–16.0)
LYMPHS ABS: 2.7 10*3/uL (ref 1.0–3.6)
Lymphocytes Relative: 19 %
MCH: 28.7 pg (ref 26.0–34.0)
MCHC: 32.3 g/dL (ref 32.0–36.0)
MCV: 88.9 fL (ref 80.0–100.0)
METAMYELOCYTES PCT: 0 %
MONO ABS: 0.9 10*3/uL (ref 0.2–0.9)
MYELOCYTES: 0 %
Monocytes Relative: 6 %
NRBC: 0 /100{WBCs}
Neutro Abs: 10.8 10*3/uL — ABNORMAL HIGH (ref 1.4–6.5)
Neutrophils Relative %: 75 %
Other: 0 %
PLATELETS: 135 10*3/uL — AB (ref 150–440)
Promyelocytes Absolute: 0 %
RBC: 4.43 MIL/uL (ref 3.80–5.20)
RDW: 13.3 % (ref 11.5–14.5)
WBC: 14.4 10*3/uL — ABNORMAL HIGH (ref 3.6–11.0)

## 2015-08-30 LAB — COMPREHENSIVE METABOLIC PANEL
ALT: 22 U/L (ref 14–54)
AST: 27 U/L (ref 15–41)
Albumin: 3.9 g/dL (ref 3.5–5.0)
Alkaline Phosphatase: 69 U/L (ref 38–126)
Anion gap: 13 (ref 5–15)
BUN: 10 mg/dL (ref 6–20)
CHLORIDE: 101 mmol/L (ref 101–111)
CO2: 21 mmol/L — AB (ref 22–32)
CREATININE: 0.82 mg/dL (ref 0.44–1.00)
Calcium: 8.5 mg/dL — ABNORMAL LOW (ref 8.9–10.3)
GFR calc non Af Amer: >60 mL/min (ref >60)
Glucose, Bld: 125 mg/dL — ABNORMAL HIGH (ref 65–99)
Potassium: 3 mmol/L — ABNORMAL LOW (ref 3.5–5.1)
SODIUM: 135 mmol/L (ref 135–145)
Total Bilirubin: 0.6 mg/dL (ref 0.3–1.2)
Total Protein: 7.9 g/dL (ref 6.5–8.1)

## 2015-08-30 LAB — URINALYSIS COMPLETE WITH MICROSCOPIC (ARMC ONLY)
Bilirubin Urine: NEGATIVE
Glucose, UA: NEGATIVE mg/dL
Hgb urine dipstick: NEGATIVE
KETONES UR: NEGATIVE mg/dL
LEUKOCYTES UA: NEGATIVE
NITRITE: NEGATIVE
PH: 5 (ref 5.0–8.0)
PROTEIN: NEGATIVE mg/dL
SPECIFIC GRAVITY, URINE: 1.009 (ref 1.005–1.030)

## 2015-08-30 LAB — TROPONIN I: Troponin I: 0.04 ng/mL (ref <0.03)

## 2015-08-30 MED ORDER — SODIUM CHLORIDE 0.9 % IV BOLUS (SEPSIS)
1000.0000 mL | Freq: Once | INTRAVENOUS | Status: AC
  Administered 2015-08-30: 1000 mL via INTRAVENOUS

## 2015-08-30 MED ORDER — HYDROMORPHONE HCL 1 MG/ML IJ SOLN
1.0000 mg | Freq: Once | INTRAMUSCULAR | Status: AC
  Administered 2015-08-30: 1 mg via INTRAVENOUS
  Filled 2015-08-30: qty 1

## 2015-08-30 MED ORDER — OXYCODONE HCL 5 MG PO TABS
10.0000 mg | ORAL_TABLET | Freq: Once | ORAL | Status: AC
  Administered 2015-08-30: 10 mg via ORAL
  Filled 2015-08-30: qty 2

## 2015-08-30 MED ORDER — POTASSIUM CHLORIDE CRYS ER 20 MEQ PO TBCR
40.0000 meq | EXTENDED_RELEASE_TABLET | Freq: Once | ORAL | Status: AC
  Administered 2015-08-30: 40 meq via ORAL
  Filled 2015-08-30: qty 2

## 2015-08-30 MED ORDER — ONDANSETRON HCL 4 MG/2ML IJ SOLN
4.0000 mg | Freq: Once | INTRAMUSCULAR | Status: AC
  Administered 2015-08-30: 4 mg via INTRAVENOUS
  Filled 2015-08-30: qty 2

## 2015-08-30 NOTE — ED Triage Notes (Signed)
Reports being dx with "a rare cancer" recently.  States she has been constipated.  Also staes she needs more oxycodone.

## 2015-08-30 NOTE — ED Notes (Signed)
Pt rang call bell asking for pain meds and crackers. When asked pt her pain level pt stats "5 or 6" RN notified about this.

## 2015-08-30 NOTE — ED Notes (Addendum)
Pt states she was seen in ED on Saturday and diagnoised with cancer, states "I left too soon, I wasn't thinking", pt states she has not been sleeping and states she is having lower abd pain and periods of confusion, at present pt awake and alert, family at bedside, pt has not seen oncologist yet

## 2015-08-30 NOTE — ED Provider Notes (Signed)
[facility] Emergency Department Provider Note   ____________________________________________  Time seen:  I have reviewed the triage vital signs and the triage nursing note.  HISTORY  Chief Complaint Constipation   Historian Patient and family member  HPI Annette [name] is a 55 y.o. female with a history of metastatic uterine cancer with abdominal metastasis and severe ongoing chronic pain due to that, was supposed to have her first oncology appointment for initiation of chemotherapy today but she was unaware that she had this appointment and came in here because of pain out of control in the abdomen.  She was recently discharged from the hospital after admission for pain management, and states that she had not been taking her oxycodone because she thought that she was supposed to be decreasing and weaning off of it.  She also states that she's had some intermittent confusion and feels like she can't remember if she should take the medication. And certainly she forgot the appointment today. Not had head imaging.  No vomiting. No bowel changes. No fever. No dysuria. No abdominal distention. Abdominal discomfort is diffuse and similar to prior episodes where and she has had CT imaging easily.    Past Medical History:  Diagnosis Date  . Asthma   . Back pain   . DDD (degenerative disc disease), lumbar   . Depression   . Diabetes mellitus without complication ([place])   . Hyperlipidemia   . Hypertension   . Insomnia   . Menopausal syndrome   . Obesity   . Onychomycosis   . Peripheral neuropathy ([name])   . Renal insufficiency     Patient Active Problem List   Diagnosis Date Noted  . Sore on leg 08/24/2015  . Intractable pain 08/23/2015  . HTN (hypertension) 08/23/2015  . HLD (hyperlipidemia) 08/23/2015  . Cancer of uterine body ([place]) 08/18/2015  . Malignant neoplasm of overlapping sites of body of uterus ([place]) 08/18/2015  . Uterine mass 07/28/2015  .  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 DDD (degenerative disc disease), lumbar 06/24/2014  . Facet syndrome, lumbar 06/24/2014  . Neuropathy due to secondary diabetes ([place]) 06/24/2014  . DDD (degenerative disc disease), cervical 06/24/2014  . Occipital neuralgia 06/24/2014  . LBP (low back pain) 06/24/2014  . Degeneration of intervertebral disc of cervical region 06/24/2014  . Degeneration of intervertebral disc of lumbar region 06/24/2014  . Diabetes mellitus ([place]) 06/24/2014  . Large breasts 05/30/2012    Past Surgical History:  Procedure Laterality Date  . COSMETIC SURGERY    . JOINT REPLACEMENT Bilateral [id number]  . LIVER BIOPSY    . REDUCTION MAMMAPLASTY Bilateral 12/15/2013  . SHOULDER SURGERY Left     Current Outpatient Rx  . Order #: [id number] Class: Historical Med  . Order #: [id number] Class: Historical Med  . Order #: [id number] Class: Historical Med  . Order #: [id number] Class: Historical Med  . Order #: [id number] Class: Historical Med  . Order #: [id number] Class: Historical Med  . Order #: [id number] Class: Historical Med  . Order #: [id number] Class: Historical Med  . Order #: [id number] Class: Historical Med  . Order #: [id number] Class: Historical Med  . Order #: [id number] Class: Historical Med  . Order #: [id number] Class: Historical Med  . Order #: [id number] Class: Print  . Order #: [id number] Class: Historical Med  . Order #: [id number] Class: Historical Med  . Order #: [id number] Class: Historical Med  . Order #: [id number] Class: Normal  . Order #: [id number] Class: Normal  . Order #: [id number] Class: Print  . Order #: [id number] Class:  Historical Med  . Order #: [id number] Class: Historical Med  . Order #: [id number] Class: Historical Med  Provera, aspirin, Neurontin, Flonase, hydrochlorothiazide, I think, lactulose, lisinopril, metformin, meloxicam, Zofran, oxycodone, Protonix, pravastatin, Premarin, Nucynta, Zanaflex, trazodone, viibryd  Allergies Iodides; Iodine; Shellfish  allergy; Shellfish-derived products; Iodinated diagnostic agents; Povidone iodine; and Povidone-iodine  Family History  Problem Relation Age of Onset  . COPD Mother   . Sleep apnea Mother   . Alcohol abuse Mother   . Depression Mother   . Stroke Mother   . Heart disease Father   . Heart disease Sister     Social History Social History  Substance Use Topics  . Smoking status: Current Every Day Smoker    Packs/day: 0.20    Types: Cigarettes  . Smokeless tobacco: Not on file  . Alcohol use No    Review of Systems  Constitutional: Negative for fever. Eyes: Negative for visual changes. ENT: Negative for sore throat. Cardiovascular: Negative for chest pain. Respiratory: Negative for shortness of breath. Gastrointestinal: Negative for vomiting and diarrhea. Genitourinary: Negative for dysuria. Musculoskeletal: Positive for back pain. Skin: Negative for rash. Neurological: Negative for headache. 10 point Review of Systems otherwise negative ____________________________________________   PHYSICAL EXAM:  VITAL SIGNS: ED Triage Vitals  Enc Vitals Group     BP 08/30/15 1226 (!) 160/99     Pulse Rate 08/30/15 1226 (!) 110     Resp 08/30/15 1226 20     Temp 08/30/15 1226 98.3 F (36.8 C)     Temp Source 08/30/15 1226 Oral     SpO2 08/30/15 1226 96 %     Weight 08/30/15 1226 285 lb (129.3 kg)     Height 08/30/15 1226 5\' 5"  (1.651 m)     Head Circumference --      Peak Flow --      Pain Score 08/30/15 1227 10     Pain Loc --      Pain Edu? --      Excl. in [place]? --      Constitutional: Alert and oriented. Appears to be in significant pain. HEENT   Head: Normocephalic and atraumatic.      Eyes: Conjunctivae are normal. PERRL. Normal extraocular movements.      Ears:         Nose: No congestion/rhinnorhea.   Mouth/Throat: Mucous membranes are moist.   Neck: No stridor. Cardiovascular/Chest: Normal rate, regular rhythm.  No murmurs, rubs, or  gallops. Respiratory: Normal respiratory effort without tachypnea nor retractions. Breath sounds are clear and equal bilaterally. No wheezes/rales/rhonchi. Gastrointestinal: Soft. No distention, no guarding, no rebound. Obese and a mild diffuse tenderness, nonfocal.  Genitourinary/rectal:Deferred Musculoskeletal: Nontender with normal range of motion in all extremities. No joint effusions.  No lower extremity tenderness.  No edema. Neurologic: She seems a little distracted by pain, but when she is able to concentrate she is alert and oriented and able to cooperate. Normal speech and language. No gross or focal neurologic deficits are appreciated. Skin:  Skin is warm, dry and intact. No rash noted. Psychiatric: Mood and affect are normal. Speech and behavior are normal. Patient exhibits appropriate insight and judgment.  ____________________________________________   EKG I, Lisa Roca, MD, the attending physician have personally viewed and interpreted all ECGs.  77 bpm normal sinus rhythm. Narrow QRS. Normal axis. Nonspecific ST and T-wave ____________________________________________  LABS (pertinent positives/negatives)  Labs Reviewed  TROPONIN I - Abnormal; Notable for the following:       Result Value  Troponin I 0.04 (*)    All other components within normal limits  CBC WITH DIFFERENTIAL/PLATELET - Abnormal; Notable for the following:    WBC 14.4 (*)    Platelets 135 (*)    Neutro Abs 10.8 (*)    All other components within normal limits  COMPREHENSIVE METABOLIC PANEL - Abnormal; Notable for the following:    Potassium 3.0 (*)    CO2 21 (*)    Glucose, Bld 125 (*)    Calcium 8.5 (*)    All other components within normal limits  URINALYSIS COMPLETEWITH MICROSCOPIC ([facility] ONLY) - Abnormal; Notable for the following:    Color, Urine YELLOW (*)    APPearance CLEAR (*)    Bacteria, UA RARE (*)    Squamous Epithelial / LPF 0-5 (*)    All other components within normal limits   URINE CULTURE  CULTURE, BLOOD (ROUTINE X 2)  CULTURE, BLOOD (ROUTINE X 2)  TROPONIN I    ____________________________________________  RADIOLOGY All Xrays were viewed by me. Imaging interpreted by Radiologist.  Chest and abdomen x-ray:  IMPRESSION: 1. No acute findings or explanation for the patient's symptoms. 2. Widespread pulmonary metastatic disease as demonstrated on recent prior studies. Electronically Signed   By: Richardean Sale M.D.  CT head without contrast: Pending  __________________________________________  PROCEDURES  Procedure(s) performed: None  Critical Care performed: None  ____________________________________________   ED COURSE / ASSESSMENT AND PLAN  Pertinent labs & imaging results that were available during my care of the patient were reviewed by me and considered in my medical decision making (see chart for details).   I saw this patient recently when she was admitted for pain control due to metastatic uterine cancer and abdominal pain which was not well controlled. She states that since she went home her pain has not been well controlled. When I review her discharge summary it appears that she is on Nucynta and supposed to have increased her dose of oxycodone for breakthrough pain to 10 mg every 6 hours. The patient states that she has not been taking it and tried to 24 hours without because she thought that she was supposed to be decreasing it.  Ultimately, she required multiple doses of IV Dilaudid here to get her pain under control. After her pain was under control she did seem to have more ability to concentrate and focus in talk to me. I think ultimately her complaint of confusion may be related to when her pain is out of control. However she has not had brain imaging, and there is discussion in her discharge summary to potentially obtain head CT imaging. There was delay in getting this CT because of electrical issues here in the emergency  department today, however when CT came back, and, CT was initiated.  The patient did receive pain relief after 2 doses of Dilaudid. Her laboratory studies showed elevated white blood cell count, nonspecific and uncertain cause. She's not having vomiting or diarrhea. No dysuria and her urinalysis I don't think is consistent with UTI but we'll send a culture. Blood cultures were sent. I will add on x-ray of the chest to make sure there is no evidence of pneumonia, but low suspicion as she is not having symptoms for this.  I spoke with oncologist Dr. Janeth Rase, who did indicate the patient missed her appointment this morning, and she can call to make this follow-up appointment. I discussed with him the elevated white blood cell count and the abdominal pains and he  did not feel like she would likely need repeat CT abdomen imaging especially given the fact that this pain is similar to prior and she was not taking what the had indicated she should be taking for increasing pain control covered with oxycodone at home.  Given minimally reactive troponin 0.04, EKG is nonspecific, I will send a repeat.  Ultimately, at around 6:30 PM, patient was feeling much better and felt confident that she could go home and take the meclizine which she had misunderstood initially.  After she receives her CT and her repeat troponin and if she remains pain control, I will intend to send her home and have her follow up with oncology as an outpatient, she can call tomorrow to make this follow-up appointment.  Repeat troponin negative. Chest and abdomen x-rays are normal.  Again I'm unsure what the minimally elevated white blood cell count is from, but she does not seem to have any evidence of sepsis. I all think that she needs any prophylactic antibiotics. I don't think that she needs repeat abdominal imaging in terms of CT.   Patient care transferred to Dr. Mariea Clonts at shift change 8:30 PM. CT of the head results pending. Patient  may be discharged with my prepared instructions a CT head without acute need for intervention or management.    CONSULTATIONS:   Dr. Burlene Arnt, oncology.  Patient / Family / Caregiver informed of clinical course, medical decision-making process, and agree with plan.   I discussed return precautions, follow-up instructions, and discharged instructions with patient and/or family.   ___________________________________________   FINAL CLINICAL IMPRESSION(S) / ED DIAGNOSES   Final diagnoses:  Generalized abdominal pain  Metastatic cancer [facility])              Note: This dictation was prepared with Dragon dictation. Any transcriptional errors that result from this process are unintentional    Lisa Roca, MD 08/30/15 2033

## 2015-08-30 NOTE — ED Notes (Signed)
New bandage applied to pts right upper leg. Pt given meal trey.

## 2015-08-30 NOTE — Discharge Instructions (Signed)
You were evaluated for abdominal pain which I suspect is due to your cancer. We discussed your pain medications, continue the Nucynta and continue the oxycodone 10 mg every 6 hours.  Return to the emergency department for any worsening condition including pain that is out of control, new or worsening pain, diarrhea, vomiting, fever, coughing, trouble breathing, confusion or altered mental status, weakness and numbness, or any other symptoms concerning to you.

## 2015-08-30 NOTE — ED Notes (Signed)
Pt assisted to bathroom, pt awake and alert in no acute distress

## 2015-08-30 NOTE — ED Notes (Signed)
Pt O2 stat was around 88-90, Pt was put on 2L of O2

## 2015-08-30 NOTE — ED Notes (Signed)
Dr. Reita Cliche was made aware of critical Troponin lvl of 0.04.

## 2015-08-31 ENCOUNTER — Telehealth: Payer: Self-pay

## 2015-08-31 ENCOUNTER — Ambulatory Visit: Payer: Medicare Other

## 2015-08-31 ENCOUNTER — Ambulatory Visit: Payer: Medicare Other | Admitting: Internal Medicine

## 2015-08-31 ENCOUNTER — Other Ambulatory Visit: Payer: Medicare Other

## 2015-08-31 NOTE — Telephone Encounter (Signed)
Spoke with Annette [name] to help her clarify her medications and she verbalizes u/o information and how to take her medications.  She will keep her appt on 09/02/15 with Dr Primus Bravo

## 2015-08-31 NOTE — Telephone Encounter (Signed)
Pt is confused about her meds. Pt does not know if she is taking meds correctly. Pt has several questions to ask concerning this issue.

## 2015-09-01 LAB — URINE CULTURE

## 2015-09-02 ENCOUNTER — Encounter: Payer: Self-pay | Admitting: Pain Medicine

## 2015-09-02 ENCOUNTER — Ambulatory Visit: Payer: Medicare Other | Attending: Pain Medicine | Admitting: Pain Medicine

## 2015-09-02 VITALS — BP 133/81 | HR 66 | Temp 98.3°F | Resp 18 | Ht 65.0 in | Wt 285.0 lb

## 2015-09-02 DIAGNOSIS — Z96649 Presence of unspecified artificial hip joint: Secondary | ICD-10-CM | POA: Diagnosis not present

## 2015-09-02 DIAGNOSIS — C548 Malignant neoplasm of overlapping sites of corpus uteri: Secondary | ICD-10-CM

## 2015-09-02 DIAGNOSIS — M19012 Primary osteoarthritis, left shoulder: Secondary | ICD-10-CM | POA: Diagnosis not present

## 2015-09-02 DIAGNOSIS — M5136 Other intervertebral disc degeneration, lumbar region: Secondary | ICD-10-CM

## 2015-09-02 DIAGNOSIS — G5701 Lesion of sciatic nerve, right lower limb: Secondary | ICD-10-CM

## 2015-09-02 DIAGNOSIS — R160 Hepatomegaly, not elsewhere classified: Secondary | ICD-10-CM | POA: Diagnosis not present

## 2015-09-02 DIAGNOSIS — M47816 Spondylosis without myelopathy or radiculopathy, lumbar region: Secondary | ICD-10-CM | POA: Diagnosis not present

## 2015-09-02 DIAGNOSIS — G589 Mononeuropathy, unspecified: Secondary | ICD-10-CM | POA: Diagnosis not present

## 2015-09-02 DIAGNOSIS — M19011 Primary osteoarthritis, right shoulder: Secondary | ICD-10-CM | POA: Diagnosis not present

## 2015-09-02 DIAGNOSIS — M533 Sacrococcygeal disorders, not elsewhere classified: Secondary | ICD-10-CM | POA: Insufficient documentation

## 2015-09-02 DIAGNOSIS — F419 Anxiety disorder, unspecified: Secondary | ICD-10-CM | POA: Diagnosis not present

## 2015-09-02 DIAGNOSIS — N858 Other specified noninflammatory disorders of uterus: Secondary | ICD-10-CM

## 2015-09-02 DIAGNOSIS — M5481 Occipital neuralgia: Secondary | ICD-10-CM

## 2015-09-02 DIAGNOSIS — C540 Malignant neoplasm of isthmus uteri: Secondary | ICD-10-CM

## 2015-09-02 DIAGNOSIS — M5030 Other cervical disc degeneration, unspecified cervical region: Secondary | ICD-10-CM | POA: Diagnosis not present

## 2015-09-02 DIAGNOSIS — R52 Pain, unspecified: Secondary | ICD-10-CM | POA: Diagnosis present

## 2015-09-02 DIAGNOSIS — M51369 Other intervertebral disc degeneration, lumbar region without mention of lumbar back pain or lower extremity pain: Secondary | ICD-10-CM

## 2015-09-02 MED ORDER — TAPENTADOL HCL ER 150 MG PO TB12: ORAL_TABLET | ORAL | 0 refills | Status: DC

## 2015-09-02 MED ORDER — TIZANIDINE HCL 2 MG PO TABS: ORAL_TABLET | ORAL | 0 refills | Status: AC

## 2015-09-02 MED ORDER — OXYCODONE HCL 10 MG PO TABS: ORAL_TABLET | ORAL | 0 refills | Status: DC

## 2015-09-02 MED ORDER — GABAPENTIN 600 MG PO TABS
ORAL_TABLET | ORAL | 0 refills | Status: DC
Start: 2015-09-02 — End: 2015-10-15

## 2015-09-02 NOTE — Progress Notes (Signed)
Safety precautions to be maintained throughout the outpatient stay will include: orient to surroundings, keep bed in low position, maintain call bell within reach at all times, provide assistance with transfer out of bed and ambulation.  

## 2015-09-02 NOTE — Progress Notes (Signed)
The patient is a 55 year old female who returns to pain management for further evaluation and treatment of pain. Patient was recently diagnosed with uterine carcinoma. The patient was recently hospitalized due to her recently diagnosed cancer. Patient returns to pain management today for follow-up evaluation treatment of her condition. On today's visit we reviewed patient's medications at time of discharge from hospital. The patient continues to be with severely disabling pain despite to present treatment regimen. We informed patient that we would modify medications on today's visit and we will consider patient for additional modifications of treatment pending further assessment of patient's condition. We also advised patient to follow up her primary care physician Dr. Quay Burow as well as her psychiatrist Dr. Kasandra Knudsen for further evaluation and treatment as well. We will continue patient's medications including Neurontin Zanaflex oxycodone and Nucynta ER. All agreed to suggested treatment plan     Physical examination  There was tenderness over the region of the paraspinal musculature region cervical region a moderate degree. There was moderate tenderness over the cervical facet cervical paraspinal musculature region as well as the splenius capitis and occipitalis region. No new masses of the head and neck were noted. Patient appeared to be unremarkable Spurling's maneuver. Range of motion maneuvers of the shoulders reveal patient to be with decreased range of motion of the shoulders with difficulty attempted to perform drop test. Palpation of the acromioclavicular and glenohumeral joint regions reproduced moderately severe discomfort. Palpation of the thoracic region was with moderately severe tenderness to palpation of muscle spasms noted without crepitus of the thoracic region noted. Palpation over the lumbar paraspinal musculatures and lumbar facet region was with moderate tenderness to palpation with  lateral bending rotation extension and palpation of the lumbar facets reproducing moderate discomfort. Straight leg raise was limited to 20 without increased pain with dorsiflexion noted. There was negative anterior and posterior drawer signs without ballottement of the patella. There was increase of pain of moderate degree with pressure applied to the ileum with patient in lateral decubitus position. There was no sensory deficit or dermatomal dystrophy detected. EHL strength decreased on the right compared to the left. There was negative clonus negative Homans. Abdomen nontender with no costovertebral tenderness noted.     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  Caution pill is stronger Nucynta ER and can cause respiratory depression, stopping of breathing, excessive sedation, confusion, and other side effects as we reminded you at time of previous visit   F/U PCP Dr. Quay Burow for evaluation of  BP carcinoma condition of liver and general medical  condition.   F/U surgical evaluation. May consider pending follow-up evaluations as discussed  F/U in college he as planned  F/U neurological evaluation. May consider PNCV/EMG studies and other studies pending follow-up evaluations. We will avoid such studies at this time F/U GI evaluation as planned  F/U psych evaluation Please follow-up with Dr. Kasandra Knudsen as  discussed  Appointment oncology as discussed  May consider radiofrequency  rhizolysis or intraspinal procedures pending response to present treatment and F/U evaluation   Patient to call [facility] should patient have concerns prior to scheduled return appointment.

## 2015-09-02 NOTE — Patient Instructions (Signed)
PLAN   Continue present medication Neurontin Zanaflex oxycodone and Nucynta ER.  Caution pill is stronger Nucynta ER and can cause respiratory depression, stopping of breathing, excessive sedation, confusion, and other side effects as we reminded you at time of previous visit   F/U PCP Dr. Quay Burow for evaluation of  BP carcinoma condition of liver and general medical  condition.   F/U surgical evaluation. May consider pending follow-up evaluations as discussed  F/U in college he as planned  F/U neurological evaluation. May consider PNCV/EMG studies and other studies pending follow-up evaluations. We will avoid such studies at this time F/U GI evaluation as planned  F/U psych evaluation Please follow-up with Dr. Kasandra Knudsen as discussed  Appointment oncology as discussed  May consider radiofrequency rhizolysis or intraspinal procedures pending response to present treatment and F/U evaluation   Patient to call Pain Management Center should patient have concerns prior to scheduled return appointment.

## 2015-09-03 ENCOUNTER — Inpatient Hospital Stay: Payer: Medicare Other

## 2015-09-03 ENCOUNTER — Inpatient Hospital Stay (HOSPITAL_BASED_OUTPATIENT_CLINIC_OR_DEPARTMENT_OTHER): Payer: Medicare Other | Admitting: Internal Medicine

## 2015-09-03 ENCOUNTER — Other Ambulatory Visit: Payer: Medicare Other

## 2015-09-03 ENCOUNTER — Ambulatory Visit: Payer: Medicare Other | Admitting: Internal Medicine

## 2015-09-03 VITALS — BP 143/85 | HR 74 | Temp 98.5°F | Resp 18 | Wt 289.5 lb

## 2015-09-03 VITALS — BP 137/83 | HR 64 | Temp 98.5°F | Resp 18

## 2015-09-03 DIAGNOSIS — G8929 Other chronic pain: Secondary | ICD-10-CM

## 2015-09-03 DIAGNOSIS — M852 Hyperostosis of skull: Secondary | ICD-10-CM

## 2015-09-03 DIAGNOSIS — I700 Atherosclerosis of aorta: Secondary | ICD-10-CM

## 2015-09-03 DIAGNOSIS — G629 Polyneuropathy, unspecified: Secondary | ICD-10-CM

## 2015-09-03 DIAGNOSIS — E785 Hyperlipidemia, unspecified: Secondary | ICD-10-CM

## 2015-09-03 DIAGNOSIS — C7800 Secondary malignant neoplasm of unspecified lung: Secondary | ICD-10-CM | POA: Diagnosis not present

## 2015-09-03 DIAGNOSIS — F329 Major depressive disorder, single episode, unspecified: Secondary | ICD-10-CM

## 2015-09-03 DIAGNOSIS — R0602 Shortness of breath: Secondary | ICD-10-CM

## 2015-09-03 DIAGNOSIS — N2889 Other specified disorders of kidney and ureter: Secondary | ICD-10-CM

## 2015-09-03 DIAGNOSIS — I10 Essential (primary) hypertension: Secondary | ICD-10-CM

## 2015-09-03 DIAGNOSIS — C548 Malignant neoplasm of overlapping sites of corpus uteri: Secondary | ICD-10-CM

## 2015-09-03 DIAGNOSIS — Z79899 Other long term (current) drug therapy: Secondary | ICD-10-CM

## 2015-09-03 DIAGNOSIS — C787 Secondary malignant neoplasm of liver and intrahepatic bile duct: Secondary | ICD-10-CM | POA: Diagnosis not present

## 2015-09-03 DIAGNOSIS — G4700 Insomnia, unspecified: Secondary | ICD-10-CM

## 2015-09-03 DIAGNOSIS — F17210 Nicotine dependence, cigarettes, uncomplicated: Secondary | ICD-10-CM

## 2015-09-03 DIAGNOSIS — E669 Obesity, unspecified: Secondary | ICD-10-CM

## 2015-09-03 DIAGNOSIS — Z7982 Long term (current) use of aspirin: Secondary | ICD-10-CM

## 2015-09-03 DIAGNOSIS — E119 Type 2 diabetes mellitus without complications: Secondary | ICD-10-CM

## 2015-09-03 DIAGNOSIS — M5136 Other intervertebral disc degeneration, lumbar region: Secondary | ICD-10-CM

## 2015-09-03 DIAGNOSIS — J45909 Unspecified asthma, uncomplicated: Secondary | ICD-10-CM

## 2015-09-03 DIAGNOSIS — M21371 Foot drop, right foot: Secondary | ICD-10-CM

## 2015-09-03 DIAGNOSIS — R410 Disorientation, unspecified: Secondary | ICD-10-CM

## 2015-09-03 DIAGNOSIS — N644 Mastodynia: Secondary | ICD-10-CM

## 2015-09-03 DIAGNOSIS — Z7984 Long term (current) use of oral hypoglycemic drugs: Secondary | ICD-10-CM

## 2015-09-03 LAB — CBC WITH DIFFERENTIAL/PLATELET
BASOS PCT: 1 %
Basophils Absolute: 0.1 10*3/uL (ref 0–0.1)
EOS ABS: 0.2 10*3/uL (ref 0–0.7)
Eosinophils Relative: 2 %
HEMATOCRIT: 34.9 % — AB (ref 35.0–47.0)
Hemoglobin: 11.6 g/dL — ABNORMAL LOW (ref 12.0–16.0)
LYMPHS ABS: 2.5 10*3/uL (ref 1.0–3.6)
Lymphocytes Relative: 24 %
MCH: 29.7 pg (ref 26.0–34.0)
MCHC: 33.3 g/dL (ref 32.0–36.0)
MCV: 89 fL (ref 80.0–100.0)
MONO ABS: 1.3 10*3/uL — AB (ref 0.2–0.9)
MONOS PCT: 13 %
NEUTROS ABS: 6.2 10*3/uL (ref 1.4–6.5)
Neutrophils Relative %: 60 %
Platelets: 174 10*3/uL (ref 150–440)
RBC: 3.92 MIL/uL (ref 3.80–5.20)
RDW: 13.5 % (ref 11.5–14.5)
WBC: 10.3 10*3/uL (ref 3.6–11.0)

## 2015-09-03 LAB — COMPREHENSIVE METABOLIC PANEL
ALBUMIN: 4 g/dL (ref 3.5–5.0)
ALK PHOS: 76 U/L (ref 38–126)
ALT: 17 U/L (ref 14–54)
ANION GAP: 8 (ref 5–15)
AST: 24 U/L (ref 15–41)
BUN: 9 mg/dL (ref 6–20)
CALCIUM: 8.6 mg/dL — AB (ref 8.9–10.3)
CO2: 32 mmol/L (ref 22–32)
Chloride: 95 mmol/L — ABNORMAL LOW (ref 101–111)
Creatinine, Ser: 0.99 mg/dL (ref 0.44–1.00)
GFR calc Af Amer: >60 mL/min (ref >60)
GFR calc non Af Amer: >60 mL/min (ref >60)
GLUCOSE: 128 mg/dL — AB (ref 65–99)
Potassium: 3.9 mmol/L (ref 3.5–5.1)
Sodium: 135 mmol/L (ref 135–145)
Total Bilirubin: 0.6 mg/dL (ref 0.3–1.2)
Total Protein: 8 g/dL (ref 6.5–8.1)

## 2015-09-03 MED ORDER — DIPHENHYDRAMINE HCL 50 MG/ML IJ SOLN
50.0000 mg | Freq: Once | INTRAMUSCULAR | Status: AC
  Administered 2015-09-03: 50 mg via INTRAVENOUS
  Filled 2015-09-03: qty 1

## 2015-09-03 MED ORDER — PROCHLORPERAZINE MALEATE 10 MG PO TABS
10.0000 mg | ORAL_TABLET | Freq: Once | ORAL | Status: AC
  Administered 2015-09-03: 10 mg via ORAL
  Filled 2015-09-03: qty 1

## 2015-09-03 MED ORDER — SODIUM CHLORIDE 0.9 % IV SOLN
Freq: Once | INTRAVENOUS | Status: AC
  Administered 2015-09-03: 11:00:00 via INTRAVENOUS
  Filled 2015-09-03: qty 1000

## 2015-09-03 MED ORDER — TEMSIROLIMUS CHEMO INJECTION 25 MG/ML W/DILUENT
25.0000 mg | Freq: Once | INTRAVENOUS | Status: AC
  Administered 2015-09-03: 25 mg via INTRAVENOUS
  Filled 2015-09-03: qty 2.5

## 2015-09-03 NOTE — Progress Notes (Signed)
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   In the interim patient was film emergency room for worsening pain- currently stable.  Patient is chronic back pain- she follows up with pain clinic. Denies any unusual shortness of breath or chest pain. Patient has chronic mild tingling and numbness of extremities. No nausea no vomiting. No chest pain.  ROS: A complete 10 point review of system is done which is negative except mentioned above in history of present illness  MEDICAL HISTORY:  Past Medical History:  Diagnosis Date  . Asthma   . Back pain   . DDD (degenerative disc disease), lumbar   . Depression   . Diabetes mellitus without complication ([place])   . Hyperlipidemia   . Hypertension   . Insomnia   . Menopausal syndrome   . Obesity   . Onychomycosis   . Peripheral neuropathy ([place])   . Renal insufficiency     SURGICAL HISTORY: Past Surgical History:  Procedure Laterality Date  . COSMETIC SURGERY    . JOINT REPLACEMENT Bilateral [id number]  . LIVER BIOPSY    . REDUCTION MAMMAPLASTY Bilateral 12/15/2013  . SHOULDER SURGERY Left      SOCIAL HISTORY: [place] Heal; live by self; smoking; no alcohol. She has children/family support. Social History   Social History  . Marital status: Single    Spouse name: N/A  . Number of children: N/A  . Years of education: N/A   Occupational History  . Not on file.   Social History Main Topics  . Smoking status: Former Smoker    Quit date: 08/19/2015  . Smokeless tobacco: Never Used  . Alcohol use No  . Drug use: No  . Sexual activity: Not on file   Other Topics Concern  . Not on file   Social History Narrative  . No narrative on file    FAMILY HISTORY: Family History  Problem Relation Age of Onset  . COPD Mother   . Sleep apnea Mother   . Alcohol abuse Mother   . Depression Mother   . Stroke Mother   . Heart disease Father   . Heart disease Sister     ALLERGIES:  is allergic to iodides; iodine; shellfish allergy; shellfish-derived products; iodinated diagnostic agents; povidone iodine; and povidone-iodine.  MEDICATIONS:  Current Outpatient Prescriptions  Medication Sig Dispense Refill  . albuterol (PROAIR HFA) 108 (90 Base) MCG/ACT inhaler Inhale 1-2 puffs into the lungs every 6 (six) hours as needed for wheezing.     Marland Kitchen aspirin EC 81 MG tablet Take 81 mg by mouth.    . beclomethasone (QVAR) 40 MCG/ACT inhaler  Inhale 2 puffs into the lungs 2 (two) times daily. Reported on 05/26/2015    . fluticasone (FLONASE) 50 MCG/ACT nasal spray Place 1 spray into both nostrils daily.     Marland Kitchen gabapentin (NEURONTIN) 600 MG tablet Limit 1 tablet by mouth twice per day if tolerated 60 tablet 0  . hydrochlorothiazide (HYDRODIURIL) 25 MG tablet Take 25 mg by mouth daily.     Marland Kitchen ibuprofen (ADVIL,MOTRIN) 800 MG tablet Take 800 mg by mouth every 8 (eight) hours as needed for mild pain or moderate pain.     Marland Kitchen lactulose (CHRONULAC) 10 GM/15ML solution Take 30 g by mouth 2 (two) times daily as needed for mild constipation or moderate constipation.     Marland Kitchen lisinopril (PRINIVIL,ZESTRIL) 10 MG  tablet Take 10 mg by mouth daily.     . metFORMIN (GLUCOPHAGE-XR) 500 MG 24 hr tablet Take 500 mg by mouth daily with breakfast.    . pravastatin (PRAVACHOL) 40 MG tablet Take 40 mg by mouth daily.     Marland Kitchen PREMARIN vaginal cream Place 1 Applicatorful vaginally at bedtime.     . senna-docusate (SENOKOT-S) 8.6-50 MG tablet Take 2 tablets by mouth daily. 60 tablet 0  . silver sulfADIAZINE (SILVADENE) 1 % cream Apply topically daily. 50 g 0  . Tapentadol HCl (NUCYNTA ER) 150 MG TB12 Limit 1 tab by mouth  every 8 -12 hours IF tolerated   (Note pill is now 150 mg and much stronger) 90 tablet 0  . tiZANidine (ZANAFLEX) 2 MG tablet Limit 1 tablet by mouth 2-4 times per day if tolerated 120 tablet 0  . traZODone (DESYREL) 150 MG tablet Take 150 mg by mouth 2 (two) times daily.     . Vilazodone HCl (VIIBRYD) 40 MG TABS Take 40 mg by mouth daily.     . ondansetron (ZOFRAN) 4 MG tablet Take 4 mg by mouth every 8 (eight) hours as needed for nausea or vomiting.     . Oxycodone HCl 10 MG TABS Limit 1 tablet by mouth every 4 hours as needed for breakthrough pain while taking Nucynta if tolerated 180 tablet 0   No current facility-administered medications for this visit.       Marland Kitchen  PHYSICAL EXAMINATION: ECOG PERFORMANCE STATUS: 2 - Symptomatic, <50% confined to bed  Vitals:   09/03/15 1006  BP: (!) 143/85  Pulse: 74  Resp: 18  Temp: 98.5 F (36.9 C)   Filed Weights   09/03/15 1006  Weight: 289 lb 8 oz (131.3 kg)    GENERAL: Well-nourished well-developed; Alert, no distress and comfortable. Accompanied by sister. She is walking by herself; Obese.  EYES: no pallor or icterus OROPHARYNX: no thrush or ulceration;  NECK: supple, no masses felt LYMPH:  no palpable lymphadenopathy in the cervical, axillary or inguinal regions LUNGS: clear to auscultation and  No wheeze or crackles HEART/CVS: regular rate & rhythm and no murmurs; No lower extremity edema ABDOMEN: abdomen soft, non-tender and normal  bowel sounds Musculoskeletal:no cyanosis of digits and no clubbing  PSYCH: drowsy-but easily arousable & oriented x 3 NEURO: no focal motor/sensory deficits SKIN:  no rashes or significant lesions  LABORATORY DATA:  I have reviewed the data as listed Lab Results  Component Value Date   WBC 10.3 09/03/2015   HGB 11.6 (L) 09/03/2015   HCT 34.9 (L) 09/03/2015   MCV 89.0 09/03/2015   PLT 174 09/03/2015    Recent Labs  08/23/15 1519  08/25/15 0325 08/28/15 [id number] 08/30/15 1241 09/03/15 0915  NA 138  < > 142  --  135 135  K 3.4*  < > 3.6  --  3.0* 3.9  CL 100*  < > 105  --  101 95*  CO2 28  < > 30  --  21* 32  GLUCOSE 115*  < > 109*  --  125* 128*  BUN 10  < > 10  --  10 9  CREATININE 0.88  < > 0.82 0.83 0.82 0.99  CALCIUM 9.1  < > 8.7*  --  8.5* 8.6*  GFRNONAA >60  < > >60 >60 >60 >60  GFRAA >60  < > >60 >60 >60 >60  PROT 8.0  --   --   --  7.9 8.0  ALBUMIN 4.0  --   --   --  3.9 4.0  AST 35  --   --   --  27 24  ALT 27  --   --   --  22 17  ALKPHOS 81  --   --   --  69 76  BILITOT <0.1*  --   --   --  0.6 0.6  < > = values in this interval not displayed.  RADIOGRAPHIC STUDIES: I have personally reviewed the radiological images as listed and agreed with the findings in the report. Ct Abdomen Pelvis Wo Contrast  Result Date: 08/23/2015 CLINICAL DATA:  Upper and lower abdominal pain. Recently diagnosed uterine cancer. EXAM: CT ABDOMEN AND PELVIS WITHOUT CONTRAST TECHNIQUE: Multidetector CT imaging of the abdomen and pelvis was performed following the standard protocol without IV contrast. COMPARISON:  07/23/2015 FINDINGS: Lower chest: Numerous nodules are again seen in the visualized lung bases and have increased in size, with the largest measuring 18 mm in the right lower lobe (series 4, image 18, previously 13 mm). There is no pleural effusion. Hepatobiliary: The numerous hypoattenuating liver lesions which have increased in size and number from the prior study, the largest  measuring 3.1 cm in segment IV (series 2, image 18, previously 1.7 cm). Gallbladder is unremarkable. No biliary dilatation. Pancreas: Unremarkable. Spleen: Unremarkable. Adrenals/Urinary Tract: Unremarkable adrenal glands and right kidney. Slight fullness of the left renal collecting system. No significant ureteral dilatation. Bladder is decompressed. Stomach/Bowel: Oral contrast is present in the stomach and multiple loops of nondilated proximal to mid small bowel. There is no evidence of bowel obstruction. The appendix is unremarkable. Vascular/Lymphatic: Normal caliber of the abdominal aorta. Rounded soft tissue posteriorly in the left aspect of the pelvis measures 3.2 x 3.1 cm (series 2, image 73, previously 2.3 x 3.0 cm). Reproductive: Suspected calcified fibroid in the posterior superior uterine segment is unchanged. Masslike enlargement of the uterus more inferiorly is grossly similar to the prior study, with assessment partially limited by streak artifact from bilateral hip arthroplasties. Other: No free fluid. Musculoskeletal: Bilateral hip arthroplasties. No suspicious lytic or blastic osseous lesion identified. IMPRESSION: 1. Interval progression of lung and liver metastases. 2. Enlargement of left pelvic nodal metastasis/ peritoneal implant. 3. Grossly similar appearance of infiltrative lower uterine mass. Electronically Signed   By: Logan Bores M.D.   On: 08/23/2015 20:40   Dg Chest 2 View  Result Date: 08/23/2015 CLINICAL DATA:  Pain under right breast and in right arm with shortness of breath beginning this morning. EXAM: CHEST  2 VIEW COMPARISON:  Chest x-ray dated 12/07/2011. FINDINGS: Heart size is normal. Atherosclerotic changes noted at the aortic arch. Ill-defined nodularity is seen within the right lower lung, largest within  the lateral aspects of the right lower lung. Left lung is relatively clear. No pleural effusion or pneumothorax seen. Osseous structures about the chest are  unremarkable. IMPRESSION: Probable small pulmonary nodules within the right lower lung, perhaps with surrounding interstitial edema. Recommend chest CT for further characterization. Aortic atherosclerosis. Electronically Signed   By: Franki Cabot M.D.   On: 08/23/2015 18:56   Ct Head Wo Contrast  Result Date: 08/30/2015 CLINICAL DATA:  Periods of confusion for the past 2 days. History of metastatic cancer. EXAM: CT HEAD WITHOUT CONTRAST TECHNIQUE: Contiguous axial images were obtained from the base of the skull through the vertex without intravenous contrast. COMPARISON:  02/28/2013; 11/10/2006 FINDINGS: Brain: Gray-white differentiation is maintained. No CT evidence of acute large territory infarct. No intraparenchymal extra-axial mass or hemorrhage. Normal size and configuration the ventricles and basilar cisterns. No midline shift. Vascular: No hyperdense vessel or unexpected calcification. Skull: No displaced calvarial fracture. Note is made of mild hyperostosis frontalis. Sinuses/Orbits: There is underpneumatization of the right frontal sinus. The remaining paranasal sinuses and mastoid air cells are normally aerated. Air-fluid levels. Other: Regional soft tissues appear normal. IMPRESSION: Negative noncontrast head CT. Electronically Signed   By: Sandi Mariscal M.D.   On: 08/30/2015 22:14  Ct Chest Wo Contrast  Result Date: 08/25/2015 CLINICAL DATA:  Pulmonary nodules by chest x-ray 08/23/2015. History of metastatic uterine cancer. EXAM: CT CHEST WITHOUT CONTRAST TECHNIQUE: Multidetector CT imaging of the chest was performed following the standard protocol without IV contrast. COMPARISON:  08/23/2015, 07/23/2015, 01/30/2008 FINDINGS: Mediastinum/Lymph Nodes: Atherosclerosis of the thoracic aorta noted. No definite mediastinal or hilar adenopathy within the limits of noncontrast imaging. Normal heart size. No pericardial effusion. Lungs/Pleura: Numerous bilateral new pulmonary nodules throughout all lobes  of both lungs. These are new compared to 01/30/2008 chest CT. Nodules are compatible with diffuse pulmonary metastases. Index peripheral right lower lobe pulmonary nodule measures 16 mm, image 80. Index left upper lobe pulmonary nodule measures 10 mm, image 33. No superimposed edema, collapse or consolidation. No focal pneumonia or airspace process. No pleural abnormality, effusion, or pneumothorax. Trachea and central airways remain patent. Upper abdomen: Numerous hypodense lesions throughout the imaged portion of the liver compatible with extensive hepatic metastases. No biliary dilatation. Gallbladder is collapsed. No upper abdominal adenopathy. Musculoskeletal: Minor thoracic degenerative change. No acute compression fracture. Thoracic spine and sternum appear intact. IMPRESSION: Innumerable bilateral pulmonary metastases involving all lobes of both lungs. Evidence of extensive diffuse hepatic metastases as well without biliary obstruction or dilatation. Thoracic aortic atherosclerosis Electronically Signed   By: Jerilynn Mages.  Shick M.D.   On: 08/25/2015 14:42   Nm Pulmonary Perf And Vent  Result Date: 08/23/2015 CLINICAL DATA:  Pain. EXAM: NUCLEAR MEDICINE VENTILATION - PERFUSION LUNG SCAN TECHNIQUE: Ventilation images were obtained in multiple projections using inhaled aerosol Tc-57m DTPA. Perfusion images were obtained in multiple projections after intravenous injection of Tc-42m MAA. RADIOPHARMACEUTICALS:  32.1 mCi Technetium-29m DTPA aerosol inhalation and 3.88 mCi Technetium-33m MAA IV COMPARISON:  Chest x-ray from earlier today FINDINGS: Ventilation: No focal ventilation defect. Perfusion: No wedge shaped peripheral perfusion defects to suggest acute pulmonary embolism. IMPRESSION: No evidence of pulmonary emboli.  Very low probability V/Q scan. Electronically Signed   By: Dorise Bullion III M.D   On: 08/23/2015 23:55   Dg Abd Acute W/chest  Result Date: 08/30/2015 CLINICAL DATA:  Low abdominal pain with  weakness and confusion for 2 days. Metastatic uterine cancer with known bilateral pulmonary nodules. EXAM: DG ABDOMEN ACUTE W/  1V CHEST COMPARISON:  Chest CT 08/25/2015.  Radiographs 08/23/2015. FINDINGS: The heart size and mediastinal contours are stable without definite adenopathy. There is aortic atherosclerosis. Innumerable pulmonary nodules are present in both lungs. There is no airspace disease, pleural effusion or pneumothorax. Postsurgical changes are noted status post lower cervical fusion. The bowel gas pattern is nonobstructive. There is no free intraperitoneal air or suspicious abdominal calcification. Patient is status post bilateral total hip arthroplasties. There are no acute or suspicious osseous findings. IMPRESSION: 1. No acute findings or explanation for the patient's symptoms. 2. Widespread pulmonary metastatic disease as demonstrated on recent prior studies. Electronically Signed   By: Richardean Sale M.D.   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 Stage IV- reviewed with the patient and family at length.  She understands  That the treatments or palliative.  #  I recommend the use of Torisel  Weekly discuss option the side efects including mucositis; diarrhea elevated glucose and hyperlipidemia. Discuss option would recommend getting a CT san after 2-3 months.  # Chronic pain- currently managed by Dr.Crisp-  Continue pain management at this time.  #  Patient will follow-up with me with labs treatment;  Follow-up next week for lab/ treatment.   # 25 minutes face-to-face with the patient discussing the above plan of care; more than 50% of time spent on prognosis/ natural history; counseling and coordination.     Cammie Sickle, MD 09/03/2015 6:29 PM

## 2015-09-03 NOTE — Assessment & Plan Note (Addendum)
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 Stage IV- reviewed with the patient and family at length.  She understands  That the treatments or palliative.  #  I recommend the use of Torisel  Weekly discuss option the side efects including mucositis; diarrhea elevated glucose and hyperlipidemia. Discuss option would recommend getting a CT san after 2-3 months.  # Chronic pain- currently managed by Dr.Crisp-  Continue pain management at this time.  #  Patient will follow-up with me with labs treatment;  Follow-up next week for lab/ treatment.

## 2015-09-04 LAB — CULTURE, BLOOD (ROUTINE X 2)
CULTURE: NO GROWTH
Culture: NO GROWTH

## 2015-09-06 ENCOUNTER — Observation Stay
Admission: EM | Admit: 2015-09-06 | Discharge: 2015-09-08 | Disposition: A | Discharge status: Home / Self Care | Payer: Medicare Other | Attending: Internal Medicine | Admitting: Internal Medicine

## 2015-09-06 ENCOUNTER — Encounter: Payer: Self-pay | Admitting: Emergency Medicine

## 2015-09-06 ENCOUNTER — Other Ambulatory Visit: Payer: Self-pay

## 2015-09-06 ENCOUNTER — Emergency Department: Payer: Medicare Other

## 2015-09-06 DIAGNOSIS — Z87891 Personal history of nicotine dependence: Secondary | ICD-10-CM | POA: Diagnosis not present

## 2015-09-06 DIAGNOSIS — K5900 Constipation, unspecified: Secondary | ICD-10-CM | POA: Insufficient documentation

## 2015-09-06 DIAGNOSIS — I700 Atherosclerosis of aorta: Secondary | ICD-10-CM | POA: Insufficient documentation

## 2015-09-06 DIAGNOSIS — M5030 Other cervical disc degeneration, unspecified cervical region: Secondary | ICD-10-CM | POA: Insufficient documentation

## 2015-09-06 DIAGNOSIS — F329 Major depressive disorder, single episode, unspecified: Secondary | ICD-10-CM | POA: Diagnosis not present

## 2015-09-06 DIAGNOSIS — Z7982 Long term (current) use of aspirin: Secondary | ICD-10-CM | POA: Insufficient documentation

## 2015-09-06 DIAGNOSIS — R1084 Generalized abdominal pain: Secondary | ICD-10-CM

## 2015-09-06 DIAGNOSIS — Z811 Family history of alcohol abuse and dependence: Secondary | ICD-10-CM | POA: Insufficient documentation

## 2015-09-06 DIAGNOSIS — R52 Pain, unspecified: Secondary | ICD-10-CM | POA: Diagnosis present

## 2015-09-06 DIAGNOSIS — G4700 Insomnia, unspecified: Secondary | ICD-10-CM | POA: Insufficient documentation

## 2015-09-06 DIAGNOSIS — R109 Unspecified abdominal pain: Secondary | ICD-10-CM

## 2015-09-06 DIAGNOSIS — M549 Dorsalgia, unspecified: Secondary | ICD-10-CM | POA: Insufficient documentation

## 2015-09-06 DIAGNOSIS — N1330 Unspecified hydronephrosis: Principal | ICD-10-CM | POA: Diagnosis present

## 2015-09-06 DIAGNOSIS — N644 Mastodynia: Secondary | ICD-10-CM | POA: Insufficient documentation

## 2015-09-06 DIAGNOSIS — Z515 Encounter for palliative care: Secondary | ICD-10-CM

## 2015-09-06 DIAGNOSIS — E785 Hyperlipidemia, unspecified: Secondary | ICD-10-CM | POA: Diagnosis not present

## 2015-09-06 DIAGNOSIS — E119 Type 2 diabetes mellitus without complications: Secondary | ICD-10-CM

## 2015-09-06 DIAGNOSIS — C7801 Secondary malignant neoplasm of right lung: Secondary | ICD-10-CM | POA: Diagnosis not present

## 2015-09-06 DIAGNOSIS — K5909 Other constipation: Secondary | ICD-10-CM | POA: Insufficient documentation

## 2015-09-06 DIAGNOSIS — Z8249 Family history of ischemic heart disease and other diseases of the circulatory system: Secondary | ICD-10-CM | POA: Insufficient documentation

## 2015-09-06 DIAGNOSIS — J45909 Unspecified asthma, uncomplicated: Secondary | ICD-10-CM | POA: Insufficient documentation

## 2015-09-06 DIAGNOSIS — M5136 Other intervertebral disc degeneration, lumbar region: Secondary | ICD-10-CM | POA: Diagnosis not present

## 2015-09-06 DIAGNOSIS — Z6841 Body Mass Index (BMI) 40.0 and over, adult: Secondary | ICD-10-CM | POA: Insufficient documentation

## 2015-09-06 DIAGNOSIS — C548 Malignant neoplasm of overlapping sites of corpus uteri: Secondary | ICD-10-CM | POA: Diagnosis present

## 2015-09-06 DIAGNOSIS — Z818 Family history of other mental and behavioral disorders: Secondary | ICD-10-CM | POA: Insufficient documentation

## 2015-09-06 DIAGNOSIS — Z91041 Radiographic dye allergy status: Secondary | ICD-10-CM | POA: Insufficient documentation

## 2015-09-06 DIAGNOSIS — C775 Secondary and unspecified malignant neoplasm of intrapelvic lymph nodes: Secondary | ICD-10-CM | POA: Diagnosis not present

## 2015-09-06 DIAGNOSIS — D259 Leiomyoma of uterus, unspecified: Secondary | ICD-10-CM | POA: Diagnosis not present

## 2015-09-06 DIAGNOSIS — Z888 Allergy status to other drugs, medicaments and biological substances status: Secondary | ICD-10-CM | POA: Insufficient documentation

## 2015-09-06 DIAGNOSIS — Z794 Long term (current) use of insulin: Secondary | ICD-10-CM | POA: Insufficient documentation

## 2015-09-06 DIAGNOSIS — I10 Essential (primary) hypertension: Secondary | ICD-10-CM | POA: Diagnosis not present

## 2015-09-06 DIAGNOSIS — C7802 Secondary malignant neoplasm of left lung: Secondary | ICD-10-CM | POA: Diagnosis not present

## 2015-09-06 DIAGNOSIS — Z825 Family history of asthma and other chronic lower respiratory diseases: Secondary | ICD-10-CM | POA: Insufficient documentation

## 2015-09-06 DIAGNOSIS — C787 Secondary malignant neoplasm of liver and intrahepatic bile duct: Secondary | ICD-10-CM | POA: Diagnosis not present

## 2015-09-06 DIAGNOSIS — B351 Tinea unguium: Secondary | ICD-10-CM | POA: Insufficient documentation

## 2015-09-06 DIAGNOSIS — C55 Malignant neoplasm of uterus, part unspecified: Secondary | ICD-10-CM | POA: Diagnosis not present

## 2015-09-06 DIAGNOSIS — C549 Malignant neoplasm of corpus uteri, unspecified: Secondary | ICD-10-CM | POA: Diagnosis not present

## 2015-09-06 DIAGNOSIS — Z79891 Long term (current) use of opiate analgesic: Secondary | ICD-10-CM | POA: Insufficient documentation

## 2015-09-06 DIAGNOSIS — M5481 Occipital neuralgia: Secondary | ICD-10-CM | POA: Insufficient documentation

## 2015-09-06 DIAGNOSIS — E1142 Type 2 diabetes mellitus with diabetic polyneuropathy: Secondary | ICD-10-CM | POA: Diagnosis not present

## 2015-09-06 DIAGNOSIS — G8929 Other chronic pain: Secondary | ICD-10-CM | POA: Insufficient documentation

## 2015-09-06 DIAGNOSIS — F419 Anxiety disorder, unspecified: Secondary | ICD-10-CM | POA: Diagnosis not present

## 2015-09-06 DIAGNOSIS — N289 Disorder of kidney and ureter, unspecified: Secondary | ICD-10-CM | POA: Diagnosis not present

## 2015-09-06 DIAGNOSIS — Z91013 Allergy to seafood: Secondary | ICD-10-CM | POA: Insufficient documentation

## 2015-09-06 DIAGNOSIS — E669 Obesity, unspecified: Secondary | ICD-10-CM | POA: Diagnosis not present

## 2015-09-06 DIAGNOSIS — Z79899 Other long term (current) drug therapy: Secondary | ICD-10-CM | POA: Insufficient documentation

## 2015-09-06 DIAGNOSIS — Z96643 Presence of artificial hip joint, bilateral: Secondary | ICD-10-CM | POA: Insufficient documentation

## 2015-09-06 LAB — COMPREHENSIVE METABOLIC PANEL
ALK PHOS: 88 U/L (ref 38–126)
ALT: 23 U/L (ref 14–54)
AST: 33 U/L (ref 15–41)
Albumin: 4 g/dL (ref 3.5–5.0)
Anion gap: 13 (ref 5–15)
BILIRUBIN TOTAL: 0.8 mg/dL (ref 0.3–1.2)
BUN: 13 mg/dL (ref 6–20)
CALCIUM: 9.2 mg/dL (ref 8.9–10.3)
CO2: 28 mmol/L (ref 22–32)
CREATININE: 0.96 mg/dL (ref 0.44–1.00)
Chloride: 94 mmol/L — ABNORMAL LOW (ref 101–111)
GFR calc non Af Amer: >60 mL/min (ref >60)
Glucose, Bld: 195 mg/dL — ABNORMAL HIGH (ref 65–99)
Potassium: 3.3 mmol/L — ABNORMAL LOW (ref 3.5–5.1)
SODIUM: 135 mmol/L (ref 135–145)
Total Protein: 8.7 g/dL — ABNORMAL HIGH (ref 6.5–8.1)

## 2015-09-06 LAB — URINALYSIS COMPLETE WITH MICROSCOPIC (ARMC ONLY)
Bacteria, UA: NONE SEEN
Bilirubin Urine: NEGATIVE
GLUCOSE, UA: NEGATIVE mg/dL
Hgb urine dipstick: NEGATIVE
KETONES UR: NEGATIVE mg/dL
Leukocytes, UA: NEGATIVE
Nitrite: NEGATIVE
Protein, ur: NEGATIVE mg/dL
RBC / HPF: NONE SEEN RBC/hpf (ref 0–5)
SPECIFIC GRAVITY, URINE: 1.005 (ref 1.005–1.030)
pH: 7 (ref 5.0–8.0)

## 2015-09-06 LAB — CBC
HCT: 39.7 % (ref 35.0–47.0)
Hemoglobin: 13.2 g/dL (ref 12.0–16.0)
MCH: 29.7 pg (ref 26.0–34.0)
MCHC: 33.3 g/dL (ref 32.0–36.0)
MCV: 89.3 fL (ref 80.0–100.0)
PLATELETS: 204 10*3/uL (ref 150–440)
RBC: 4.45 MIL/uL (ref 3.80–5.20)
RDW: 12.9 % (ref 11.5–14.5)
WBC: 16 10*3/uL — ABNORMAL HIGH (ref 3.6–11.0)

## 2015-09-06 LAB — LIPASE, BLOOD: Lipase: 18 U/L (ref 11–51)

## 2015-09-06 MED ORDER — MORPHINE SULFATE (PF) 4 MG/ML IV SOLN
6.0000 mg | Freq: Once | INTRAVENOUS | Status: AC
  Administered 2015-09-06: 6 mg via INTRAVENOUS
  Filled 2015-09-06: qty 2

## 2015-09-06 MED ORDER — MAGNESIUM HYDROXIDE 400 MG/5ML PO SUSP
960.0000 mL | Freq: Once | ORAL | Status: AC
  Administered 2015-09-07: 960 mL via RECTAL
  Filled 2015-09-06: qty 240

## 2015-09-06 MED ORDER — HYDROMORPHONE HCL 1 MG/ML IJ SOLN
1.0000 mg | INTRAMUSCULAR | Status: AC
Start: 2015-09-06 — End: 2015-09-06
  Administered 2015-09-06: 1 mg via INTRAVENOUS
  Filled 2015-09-06: qty 1

## 2015-09-06 MED ORDER — BARIUM SULFATE 2.1 % PO SUSP
450.0000 mL | ORAL | Status: AC
Start: 2015-09-06 — End: 2015-09-06
  Administered 2015-09-06 (×2): 450 mL via ORAL

## 2015-09-06 NOTE — Discharge Instructions (Signed)
Please seek medical attention for any high fevers, chest pain, shortness of breath, change in behavior, persistent vomiting, bloody stool or any other new or concerning symptoms.  

## 2015-09-06 NOTE — ED Triage Notes (Signed)
Pt c/o lower abdominal pain and around umbical area X 3 days but worse today. No bowel movement X 4 days

## 2015-09-06 NOTE — ED Provider Notes (Signed)
[facility] Emergency Department Provider Note    ____________________________________________   I have reviewed the triage vital signs and the nursing notes.   HISTORY  Chief Complaint Abdominal Pain   History limited by: Not Limited   HPI Annette [name] is a 55 y.o. female with history of metastatic uterine cancer who presents to the emergency department today because of concerns for abdominal pain. Has been getting worse the past couple of days. Patient unfortunately has a poor prognosis with her cancer and is currently on palliative treatment through our oncology clinic. Patient has also been seen by the colleges and pain clinic doctor recently. She has been trying a narcotic pain medication at home without any great relief. She denies any recent fevers.   Past Medical History:  Diagnosis Date  . Asthma   . Back pain   . DDD (degenerative disc disease), lumbar   . Depression   . Diabetes mellitus without complication ([place])   . Hyperlipidemia   . Hypertension   . Insomnia   . Menopausal syndrome   . Obesity   . Onychomycosis   . Peripheral neuropathy ([place])   . Renal insufficiency     Patient Active Problem List   Diagnosis Date Noted  . Sore on leg 08/24/2015  . Intractable pain 08/23/2015  . HTN (hypertension) 08/23/2015  . HLD (hyperlipidemia) 08/23/2015  . Cancer of uterine body ([place]) 08/18/2015  . Malignant neoplasm of overlapping sites of body of uterus ([place]) 08/18/2015  . Uterine mass 07/28/2015  . 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 DDD (degenerative disc disease), lumbar 06/24/2014  . Facet syndrome, lumbar 06/24/2014  . Neuropathy due to secondary diabetes ([place]) 06/24/2014  . DDD (degenerative disc disease), cervical 06/24/2014  . Occipital neuralgia 06/24/2014  . LBP (low back pain) 06/24/2014  . Degeneration of intervertebral disc of cervical region 06/24/2014  . Degeneration of intervertebral disc of lumbar region  06/24/2014  . Diabetes mellitus ([place]) 06/24/2014  . Large breasts 05/30/2012    Past Surgical History:  Procedure Laterality Date  . COSMETIC SURGERY    . JOINT REPLACEMENT Bilateral [id number]  . LIVER BIOPSY    . REDUCTION MAMMAPLASTY Bilateral 12/15/2013  . SHOULDER SURGERY Left     Prior to Admission medications   Medication Sig Start Date End Date Taking? Authorizing Provider  albuterol (PROAIR HFA) 108 (90 Base) MCG/ACT inhaler Inhale 1-2 puffs into the lungs every 6 (six) hours as needed for wheezing.     Historical Provider, MD  aspirin EC 81 MG tablet Take 81 mg by mouth.    Historical Provider, MD  beclomethasone (QVAR) 40 MCG/ACT inhaler Inhale 2 puffs into the lungs 2 (two) times daily. Reported on 05/26/2015    Historical Provider, MD  fluticasone (FLONASE) 50 MCG/ACT nasal spray Place 1 spray into both nostrils daily.     Historical Provider, MD  gabapentin (NEURONTIN) 600 MG tablet Limit 1 tablet by mouth twice per day if tolerated 09/02/15   Mohammed Kindle, MD  hydrochlorothiazide (HYDRODIURIL) 25 MG tablet Take 25 mg by mouth daily.     Historical Provider, MD  ibuprofen (ADVIL,MOTRIN) 800 MG tablet Take 800 mg by mouth every 8 (eight) hours as needed for mild pain or moderate pain.     Historical Provider, MD  lactulose (CHRONULAC) 10 GM/15ML solution Take 30 g by mouth 2 (two) times daily as needed for mild constipation or moderate constipation.     Historical Provider, MD  lisinopril (PRINIVIL,ZESTRIL)  10 MG tablet Take 10 mg by mouth daily.     Historical Provider, MD  metFORMIN (GLUCOPHAGE-XR) 500 MG 24 hr tablet Take 500 mg by mouth daily with breakfast.    Historical Provider, MD  ondansetron (ZOFRAN) 4 MG tablet Take 4 mg by mouth every 8 (eight) hours as needed for nausea or vomiting.     Historical Provider, MD  Oxycodone HCl 10 MG TABS Limit 1 tablet by mouth every 4 hours as needed for breakthrough pain while taking Nucynta if tolerated 09/02/15   Mohammed Kindle, MD   pravastatin (PRAVACHOL) 40 MG tablet Take 40 mg by mouth daily.     Historical Provider, MD  PREMARIN vaginal cream Place 1 Applicatorful vaginally at bedtime.     Historical Provider, MD  senna-docusate (SENOKOT-S) 8.6-50 MG tablet Take 2 tablets by mouth daily. 08/28/15   Bettey Costa, MD  silver sulfADIAZINE (SILVADENE) 1 % cream Apply topically daily. 08/28/15   Bettey Costa, MD  Tapentadol HCl (NUCYNTA ER) 150 MG TB12 Limit 1 tab by mouth  every 8 -12 hours IF tolerated   (Note pill is now 150 mg and much stronger) 09/02/15   Mohammed Kindle, MD  tiZANidine (ZANAFLEX) 2 MG tablet Limit 1 tablet by mouth 2-4 times per day if tolerated 09/02/15   Mohammed Kindle, MD  traZODone (DESYREL) 150 MG tablet Take 150 mg by mouth 2 (two) times daily.     Historical Provider, MD  Vilazodone HCl (VIIBRYD) 40 MG TABS Take 40 mg by mouth daily.     Historical Provider, MD    Allergies Iodides; Iodine; Shellfish allergy; Shellfish-derived products; Iodinated diagnostic agents; Povidone iodine; and Povidone-iodine  Family History  Problem Relation Age of Onset  . COPD Mother   . Sleep apnea Mother   . Alcohol abuse Mother   . Depression Mother   . Stroke Mother   . Heart disease Father   . Heart disease Sister     Social History Social History  Substance Use Topics  . Smoking status: Former Smoker    Quit date: 08/19/2015  . Smokeless tobacco: Never Used  . Alcohol use No    Review of Systems  Constitutional: Negative for fever. Cardiovascular: Negative for chest pain. Respiratory: Negative for shortness of breath. Gastrointestinal: Positive for abdominal pain Neurological: Negative for headaches, focal weakness or numbness.   10-point ROS otherwise negative.  ____________________________________________   PHYSICAL EXAM:  VITAL SIGNS: ED Triage Vitals  Enc Vitals Group     BP 09/06/15 1846 (!) 120/108     Pulse Rate 09/06/15 1844 (!) 110     Resp 09/06/15 1844 (!) 26     Temp 09/06/15  1844 98.7 F (37.1 C)     Temp Source 09/06/15 1844 Oral     SpO2 09/06/15 1844 99 %     Weight 09/06/15 1844 281 lb (127.5 kg)     Height 09/06/15 1844 5\' 5"  (1.651 m)     Head Circumference --      Peak Flow --      Pain Score 09/06/15 1845 10   Constitutional: Alert and oriented. Appears uncomfortable. Eyes: Conjunctivae are normal. PERRL. Normal extraocular movements. ENT   Head: Normocephalic and atraumatic.   Nose: No congestion/rhinnorhea.   Mouth/Throat: Mucous membranes are moist.   Neck: No stridor. Hematological/Lymphatic/Immunilogical: No cervical lymphadenopathy. Cardiovascular: Normal rate, regular rhythm.  No murmurs, rubs, or gallops. Respiratory: Normal respiratory effort without tachypnea nor retractions. Breath sounds are clear and equal bilaterally.  No wheezes/rales/rhonchi. Gastrointestinal: Soft and diffusely tender to palpation. Genitourinary: Deferred Musculoskeletal: Normal range of motion in all extremities. No joint effusions.  No lower extremity tenderness nor edema. Neurologic:  Normal speech and language. No gross focal neurologic deficits are appreciated.  Skin:  Skin is warm, dry and intact. No rash noted. Psychiatric: Tearful.  ____________________________________________    LABS (pertinent positives/negatives)  WBC 16 Na 135 K 3.3 Crr 0.96  ____________________________________________   EKG  I, Nance Pear, attending physician, personally viewed and interpreted this EKG  EKG Time: 1918 Rate: 95 Rhythm: normal sinus rhythm Axis: normal Intervals: qtc 489 QRS: narrow, q waves II, III, aVF ST changes: no st elevation Impression: abnormal ekg   ____________________________________________    RADIOLOGY  CT abd/pel IMPRESSION: Progressive metastatic uterine carcinoma with increase in the size and number of pulmonary nodules and increase the size of the hepatic metastases. Moderate bilateral hydronephrosis due  to obstruction from the patient's uterine mass is new since the CT 2 weeks ago.   ____________________________________________   PROCEDURES  Procedures  ____________________________________________   INITIAL IMPRESSION / ASSESSMENT AND PLAN / ED COURSE  Pertinent labs & imaging results that were available during my care of the patient were reviewed by me and considered in my medical decision making (see chart for details).  Patient with history of known uterine cancer with metastases who presents to the emergency department with increasing abdominal pain. Patient is been a seen in the emergency department, oncology clinic and pain clinic for her pain. Will repeat imaging today. Will try to manage pain with IV pain medication.  Clinical Course   Patient CT scan without any acute findings. There is some question of hydronephrosis at this point. Creatinine is within normal limits and patient is producing urine. Do not feel she requires emergent decompression. Will continue to try pain control. If patient's pain is not able to be controlled she will likely need admission. ____________________________________________   FINAL CLINICAL IMPRESSION(S) / ED DIAGNOSES  Final diagnoses:  Abdominal pain, unspecified abdominal location  Generalized abdominal pain  Intractable pain  Malignant neoplasm of uterus, unspecified site [facility])     Note: This dictation was prepared with Dragon dictation. Any transcriptional errors that result from this process are unintentional    Nance Pear, MD 09/07/15 [phone]

## 2015-09-06 NOTE — H&P (Signed)
[place] [facility] [place] NAME: Annette [name]    MR#:  [id number]  DATE OF BIRTH:  1960/08/25  DATE OF ADMISSION:  09/06/2015  PRIMARY CARE PHYSICIAN: Ellamae Sia, MD   REQUESTING/REFERRING PHYSICIAN: Karma Greaser, MD  CHIEF COMPLAINT:   Chief Complaint  Patient presents with  . Abdominal Pain    HISTORY OF PRESENT ILLNESS:  Annette [name]  is a 55 y.o. female who presents with Intractable abdominal pain. Patient was recently admitted here to the hospital for the same thing within the last couple weeks and states that she's had a difficult time managing her pain since then. Tonight became significantly worse again. Lab workup here is initially within normal limits, but imaging shows new bilateral hydronephrosis due to her uterine mass. Multiple rounds of pain meds in the ED did not control her pain. Patient also states she's not had a bowel movement for the past 6 days. Hospitals were called for admission.  PAST MEDICAL HISTORY:   Past Medical History:  Diagnosis Date  . Asthma   . Back pain   . DDD (degenerative disc disease), lumbar   . Depression   . Diabetes mellitus without complication ([place])   . Hyperlipidemia   . Hypertension   . Insomnia   . Menopausal syndrome   . Obesity   . Onychomycosis   . Peripheral neuropathy ([place])   . Renal insufficiency     PAST SURGICAL HISTORY:   Past Surgical History:  Procedure Laterality Date  . COSMETIC SURGERY    . JOINT REPLACEMENT Bilateral [id number]  . LIVER BIOPSY    . REDUCTION MAMMAPLASTY Bilateral 12/15/2013  . SHOULDER SURGERY Left     SOCIAL HISTORY:   Social History  Substance Use Topics  . Smoking status: Former Smoker    Quit date: 08/19/2015  . Smokeless tobacco: Never Used  . Alcohol use No    FAMILY HISTORY:   Family History  Problem Relation Age of Onset  . COPD Mother   . Sleep apnea Mother   . Alcohol abuse Mother   . Depression Mother   . Stroke  Mother   . Heart disease Father   . Heart disease Sister     DRUG ALLERGIES:   Allergies  Allergen Reactions  . Iodides Swelling  . Iodine Swelling  . Shellfish Allergy Swelling    shrimp causes swelling  . Shellfish-Derived Products Swelling  . Iodinated Diagnostic Agents Swelling  . Povidone Iodine Swelling    eyes  . Povidone-Iodine Swelling    MEDICATIONS AT HOME:   Prior to Admission medications   Medication Sig Start Date End Date Taking? Authorizing Provider  albuterol (PROAIR HFA) 108 (90 Base) MCG/ACT inhaler Inhale 1-2 puffs into the lungs every 6 (six) hours as needed for wheezing.     Historical Provider, MD  aspirin EC 81 MG tablet Take 81 mg by mouth.    Historical Provider, MD  beclomethasone (QVAR) 40 MCG/ACT inhaler Inhale 2 puffs into the lungs 2 (two) times daily. Reported on 05/26/2015    Historical Provider, MD  fluticasone (FLONASE) 50 MCG/ACT nasal spray Place 1 spray into both nostrils daily.     Historical Provider, MD  gabapentin (NEURONTIN) 600 MG tablet Limit 1 tablet by mouth twice per day if tolerated 09/02/15   Mohammed Kindle, MD  hydrochlorothiazide (HYDRODIURIL) 25 MG tablet Take 25 mg by mouth daily.     Historical Provider, MD  ibuprofen (ADVIL,MOTRIN) 800 MG tablet  Take 800 mg by mouth every 8 (eight) hours as needed for mild pain or moderate pain.     Historical Provider, MD  lactulose (CHRONULAC) 10 GM/15ML solution Take 30 g by mouth 2 (two) times daily as needed for mild constipation or moderate constipation.     Historical Provider, MD  lisinopril (PRINIVIL,ZESTRIL) 10 MG tablet Take 10 mg by mouth daily.     Historical Provider, MD  metFORMIN (GLUCOPHAGE-XR) 500 MG 24 hr tablet Take 500 mg by mouth daily with breakfast.    Historical Provider, MD  ondansetron (ZOFRAN) 4 MG tablet Take 4 mg by mouth every 8 (eight) hours as needed for nausea or vomiting.     Historical Provider, MD  Oxycodone HCl 10 MG TABS Limit 1 tablet by mouth every 4 hours  as needed for breakthrough pain while taking Nucynta if tolerated 09/02/15   Mohammed Kindle, MD  pravastatin (PRAVACHOL) 40 MG tablet Take 40 mg by mouth daily.     Historical Provider, MD  PREMARIN vaginal cream Place 1 Applicatorful vaginally at bedtime.     Historical Provider, MD  senna-docusate (SENOKOT-S) 8.6-50 MG tablet Take 2 tablets by mouth daily. 08/28/15   Bettey Costa, MD  silver sulfADIAZINE (SILVADENE) 1 % cream Apply topically daily. 08/28/15   Bettey Costa, MD  Tapentadol HCl (NUCYNTA ER) 150 MG TB12 Limit 1 tab by mouth  every 8 -12 hours IF tolerated   (Note pill is now 150 mg and much stronger) 09/02/15   Mohammed Kindle, MD  tiZANidine (ZANAFLEX) 2 MG tablet Limit 1 tablet by mouth 2-4 times per day if tolerated 09/02/15   Mohammed Kindle, MD  traZODone (DESYREL) 150 MG tablet Take 150 mg by mouth 2 (two) times daily.     Historical Provider, MD  Vilazodone HCl (VIIBRYD) 40 MG TABS Take 40 mg by mouth daily.     Historical Provider, MD    REVIEW OF SYSTEMS:  Review of Systems  Constitutional: Negative for chills, fever, malaise/fatigue and weight loss.  HENT: Negative for ear pain, hearing loss and tinnitus.   Eyes: Negative for blurred vision, double vision, pain and redness.  Respiratory: Negative for cough, hemoptysis and shortness of breath.   Cardiovascular: Negative for chest pain, palpitations, orthopnea and leg swelling.  Gastrointestinal: Positive for abdominal pain and constipation. Negative for diarrhea, nausea and vomiting.  Genitourinary: Negative for dysuria, frequency and hematuria.  Musculoskeletal: Negative for back pain, joint pain and neck pain.  Skin:       No acne, rash, or lesions  Neurological: Negative for dizziness, tremors, focal weakness and weakness.  Endo/Heme/Allergies: Negative for polydipsia. Does not bruise/bleed easily.  Psychiatric/Behavioral: Negative for depression. The patient is not nervous/anxious and does not have insomnia.      VITAL  SIGNS:   Vitals:   09/06/15 2100 09/06/15 2115 09/06/15 2130 09/06/15 2206  BP: (!) 155/88   (!) 159/91  Pulse: 93 92 89 91  Resp: 19 (!) 22 17 (!) 22  Temp:    99.9 F (37.7 C)  TempSrc:    Oral  SpO2: 97% 96% 92% 95%  Weight:      Height:       Wt Readings from Last 3 Encounters:  09/06/15 127.5 kg (281 lb)  09/03/15 131.3 kg (289 lb 8 oz)  09/02/15 129.3 kg (285 lb)    PHYSICAL EXAMINATION:  Physical Exam  Vitals reviewed. Constitutional: She is oriented to person, place, and time. She appears well-developed and well-nourished. She  appears distressed.  HENT:  Head: Normocephalic and atraumatic.  Mouth/Throat: Oropharynx is clear and moist.  Eyes: Conjunctivae and EOM are normal. Pupils are equal, round, and reactive to light. No scleral icterus.  Neck: Normal range of motion. Neck supple. No JVD present. No thyromegaly present.  Cardiovascular: Normal rate, regular rhythm and intact distal pulses.  Exam reveals no gallop and no friction rub.   No murmur heard. Respiratory: Effort normal and breath sounds normal. No respiratory distress. She has no wheezes. She has no rales.  GI: Soft. Bowel sounds are normal. She exhibits no distension. There is tenderness.  Musculoskeletal: Normal range of motion. She exhibits no edema.  No arthritis, no gout  Lymphadenopathy:    She has no cervical adenopathy.  Neurological: She is alert and oriented to person, place, and time. No cranial nerve deficit.  No dysarthria, no aphasia  Skin: Skin is warm and dry. No rash noted. No erythema.  Psychiatric: She has a normal mood and affect. Her behavior is normal. Judgment and thought content normal.    LABORATORY PANEL:   CBC  Recent Labs Lab 09/06/15 1846  WBC 16.0*  HGB 13.2  HCT 39.7  PLT 204   ------------------------------------------------------------------------------------------------------------------  Chemistries   Recent Labs Lab 09/06/15 1846  NA 135  K 3.3*   CL 94*  CO2 28  GLUCOSE 195*  BUN 13  CREATININE 0.96  CALCIUM 9.2  AST 33  ALT 23  ALKPHOS 88  BILITOT 0.8   ------------------------------------------------------------------------------------------------------------------  Cardiac Enzymes No results for input(s): TROPONINI in the last 168 hours. ------------------------------------------------------------------------------------------------------------------  RADIOLOGY:  Ct Abdomen Pelvis Wo Contrast  Result Date: 09/06/2015 CLINICAL DATA:  Lower abdominal pain about the mid umbilicus for 3 days, worsening. History of metastatic uterine carcinoma. EXAM: CT ABDOMEN AND PELVIS WITHOUT CONTRAST TECHNIQUE: Multidetector CT imaging of the abdomen and pelvis was performed following the standard protocol without IV contrast. COMPARISON:  CT abdomen and pelvis 08/23/2015 and 07/23/2014. FINDINGS: Innumerable pulmonary nodules are seen in the lower lobes bilaterally consistent with metastatic disease. Largest nodule in the right lower lobe measures 1.6 cm, unchanged. A nodule in the left lower lobe which had measured 1.0 cm in diameter today measures 1.5 cm on image 8. Several small new lesions are identified. No pleural or pericardial effusion. Innumerable hepatic metastases are identified as on the prior examinations. Index lesion in the right hepatic lobe measuring 3.1 cm on image 19 is unchanged since the most recent study. A second index lesion near the dome of the liver on image 9 measures 1.6 cm compared to 1.0 cm on the prior exam. The spleen, adrenal glands and pancreas appear normal. Since the prior study, the patient has developed moderate bilateral hydronephrosis, worse on the left. Cause for the obstruction is the patient's markedly enlarged uterus consistent with history of carcinoma. The uterus today measures 13.5 x 13.2 cm in the axial plane on image 72 compared to 11.5 x 12.7 cm on the prior examination. There is mass effect on the  urinary bladder from the uterus. Calcified fibroids are noted. No lymphadenopathy is identified. Previously seen soft tissue density along the posterior aspect of the uterus on the left can no longer be separately identified. No evidence of bowel obstruction is seen. The rectosigmoid colon is difficult to visualize due to the patient's uterine mass. No fluid collection is identified. No lytic or sclerotic bony lesion is seen. IMPRESSION: Progressive metastatic uterine carcinoma with increase in the size and number of pulmonary  nodules and increase the size of the hepatic metastases. Moderate bilateral hydronephrosis due to obstruction from the patient's uterine mass is new since the CT 2 weeks ago. Electronically Signed   By: Inge Rise M.D.   On: 09/06/2015 21:28    EKG:   Orders placed or performed in visit on 09/06/15  . EKG 12-Lead    IMPRESSION AND PLAN:  Principal Problem:   Intractable pain - IV Dilaudid in the ED seem to help her pain some. We'll admit her for observation today with IV pain medications as well as IV antiemetics. We'll give her an enema upfront to help treat her constipation. We'll also consult oncology for further recommendations, also regarding her hydronephrosis as below. Active Problems:   Diabetes mellitus (HCC) - sliding scale insulin with corresponding glucose checks and carb modified diet   Malignant neoplasm of overlapping sites of body of uterus ([place]) - following with oncology, consult as above   HTN (hypertension) - continue home meds   Bilateral hydronephrosis - new, due to traction from uterine mass, creatinine currently stable, oncology consult as above   HLD (hyperlipidemia) - continue home meds  All the records are reviewed and case discussed with ED provider. Management plans discussed with the patient and/or family.  DVT PROPHYLAXIS: SubQ lovenox  GI PROPHYLAXIS: None  ADMISSION STATUS: Observation  CODE STATUS: Full Code Status History     Date Active Date Inactive Code Status Order ID Comments User Context   08/24/2015  2:10 AM 08/28/2015 12:17 PM Full Code [id number]  Lance Coon, MD Inpatient      TOTAL TIME TAKING CARE OF THIS PATIENT: 40 minutes.    Ulyses Panico [place] 09/06/2015, 11:01 PM  [facility]  Office  [phone]  CC: Primary care physician; Ellamae Sia, MD

## 2015-09-06 NOTE — ED Provider Notes (Signed)
-----------------------------------------   9:32 PM on 09/06/2015 -----------------------------------------   Blood pressure (!) 159/91, pulse 91, temperature 99.9 F (37.7 C), temperature source Oral, resp. rate (!) 22, height 5\' 5"  (1.651 m), weight 127.5 kg, SpO2 95 %.  Assuming care from Dr. Archie Balboa.  In short, Annette [name] is a 55 y.o. female with a chief complaint of Abdominal Pain .  Refer to the original H&P for additional details.  The current plan of care is to reassess is pain control is sufficient.Marland Kitchen   ----------------------------------------- 10:48 PM on 09/06/2015 -----------------------------------------  Pain is not adequately controlled after multiple rounds of IV medication.  The patient is profusely diaphoretic, sitting up in bed rocking back and forth.  Her CT scan is notable for "innumerable metastases".  She has little to no understanding of the extent of her disease and became tearful when I explained to her that there does not seem to be something acute tonight but that she is certainly struggling from aggressive cancer.  She burst into tears and said that no one is explained that to her, she had no idea that it was bad, etc.  I discussed the case with the hospitalist, Dr. Jannifer Franklin, who remembers her from her prior admission.  We reviewed notes that indicate that Dr. Rogue Bussing has, in fact, explained to her that she has stage IV cancer and is getting palliative treatment only.  We will admit her for pain control and consult with oncology to try to further explain to her the gravity of her diagnosis and poor prognosis.   Hinda Kehr, MD 09/06/15 [phone]

## 2015-09-06 NOTE — Progress Notes (Signed)
Dr Archie Balboa at beside assessing pt.

## 2015-09-07 ENCOUNTER — Telehealth: Payer: Self-pay

## 2015-09-07 DIAGNOSIS — J45909 Unspecified asthma, uncomplicated: Secondary | ICD-10-CM

## 2015-09-07 DIAGNOSIS — Z79899 Other long term (current) drug therapy: Secondary | ICD-10-CM

## 2015-09-07 DIAGNOSIS — M5136 Other intervertebral disc degeneration, lumbar region: Secondary | ICD-10-CM

## 2015-09-07 DIAGNOSIS — C55 Malignant neoplasm of uterus, part unspecified: Secondary | ICD-10-CM

## 2015-09-07 DIAGNOSIS — C548 Malignant neoplasm of overlapping sites of corpus uteri: Secondary | ICD-10-CM | POA: Diagnosis not present

## 2015-09-07 DIAGNOSIS — C7800 Secondary malignant neoplasm of unspecified lung: Secondary | ICD-10-CM | POA: Diagnosis not present

## 2015-09-07 DIAGNOSIS — Z515 Encounter for palliative care: Secondary | ICD-10-CM | POA: Diagnosis not present

## 2015-09-07 DIAGNOSIS — E669 Obesity, unspecified: Secondary | ICD-10-CM

## 2015-09-07 DIAGNOSIS — K5900 Constipation, unspecified: Secondary | ICD-10-CM

## 2015-09-07 DIAGNOSIS — C787 Secondary malignant neoplasm of liver and intrahepatic bile duct: Secondary | ICD-10-CM | POA: Diagnosis not present

## 2015-09-07 DIAGNOSIS — R1084 Generalized abdominal pain: Secondary | ICD-10-CM | POA: Diagnosis not present

## 2015-09-07 DIAGNOSIS — N1330 Unspecified hydronephrosis: Secondary | ICD-10-CM | POA: Diagnosis not present

## 2015-09-07 DIAGNOSIS — E119 Type 2 diabetes mellitus without complications: Secondary | ICD-10-CM

## 2015-09-07 DIAGNOSIS — R109 Unspecified abdominal pain: Secondary | ICD-10-CM

## 2015-09-07 DIAGNOSIS — Z7982 Long term (current) use of aspirin: Secondary | ICD-10-CM

## 2015-09-07 DIAGNOSIS — N2889 Other specified disorders of kidney and ureter: Secondary | ICD-10-CM

## 2015-09-07 DIAGNOSIS — I10 Essential (primary) hypertension: Secondary | ICD-10-CM

## 2015-09-07 DIAGNOSIS — G629 Polyneuropathy, unspecified: Secondary | ICD-10-CM

## 2015-09-07 DIAGNOSIS — F329 Major depressive disorder, single episode, unspecified: Secondary | ICD-10-CM

## 2015-09-07 DIAGNOSIS — M549 Dorsalgia, unspecified: Secondary | ICD-10-CM

## 2015-09-07 DIAGNOSIS — G8929 Other chronic pain: Secondary | ICD-10-CM

## 2015-09-07 DIAGNOSIS — Z87891 Personal history of nicotine dependence: Secondary | ICD-10-CM

## 2015-09-07 DIAGNOSIS — E785 Hyperlipidemia, unspecified: Secondary | ICD-10-CM

## 2015-09-07 DIAGNOSIS — F419 Anxiety disorder, unspecified: Secondary | ICD-10-CM

## 2015-09-07 LAB — BASIC METABOLIC PANEL
ANION GAP: 11 (ref 5–15)
BUN: 10 mg/dL (ref 6–20)
CALCIUM: 8.7 mg/dL — AB (ref 8.9–10.3)
CHLORIDE: 96 mmol/L — AB (ref 101–111)
CO2: 32 mmol/L (ref 22–32)
CREATININE: 0.91 mg/dL (ref 0.44–1.00)
GFR calc non Af Amer: >60 mL/min (ref >60)
Glucose, Bld: 147 mg/dL — ABNORMAL HIGH (ref 65–99)
Potassium: 2.8 mmol/L — ABNORMAL LOW (ref 3.5–5.1)
SODIUM: 139 mmol/L (ref 135–145)

## 2015-09-07 LAB — POTASSIUM: Potassium: 3.4 mmol/L — ABNORMAL LOW (ref 3.5–5.1)

## 2015-09-07 LAB — GLUCOSE, CAPILLARY
GLUCOSE-CAPILLARY: 145 mg/dL — AB (ref 65–99)
GLUCOSE-CAPILLARY: 151 mg/dL — AB (ref 65–99)
Glucose-Capillary: 160 mg/dL — ABNORMAL HIGH (ref 65–99)
Glucose-Capillary: 84 mg/dL (ref 65–99)
Glucose-Capillary: 147 mg/dL — ABNORMAL HIGH (ref 65–99)

## 2015-09-07 LAB — CBC
HCT: 36.8 % (ref 35.0–47.0)
Hemoglobin: 12.4 g/dL (ref 12.0–16.0)
MCH: 30.1 pg (ref 26.0–34.0)
MCHC: 33.8 g/dL (ref 32.0–36.0)
MCV: 89.1 fL (ref 80.0–100.0)
PLATELETS: 169 10*3/uL (ref 150–440)
RBC: 4.13 MIL/uL (ref 3.80–5.20)
RDW: 13.2 % (ref 11.5–14.5)
WBC: 10.6 10*3/uL (ref 3.6–11.0)

## 2015-09-07 LAB — MAGNESIUM: Magnesium: 2.1 mg/dL (ref 1.7–2.4)

## 2015-09-07 MED ORDER — PRAVASTATIN SODIUM 40 MG PO TABS
40.0000 mg | ORAL_TABLET | Freq: Every day | ORAL | Status: DC
  Administered 2015-09-07 – 2015-09-08 (×2): 40 mg via ORAL
  Filled 2015-09-07 (×2): qty 1

## 2015-09-07 MED ORDER — ACETAMINOPHEN 650 MG RE SUPP: 650.0000 mg | Freq: Four times a day (QID) | RECTAL | Status: DC | PRN

## 2015-09-07 MED ORDER — GABAPENTIN 300 MG PO CAPS
600.0000 mg | ORAL_CAPSULE | Freq: Two times a day (BID) | ORAL | Status: DC
  Administered 2015-09-07 – 2015-09-08 (×4): 600 mg via ORAL
  Filled 2015-09-07 (×4): qty 2

## 2015-09-07 MED ORDER — BECLOMETHASONE DIPROPIONATE 40 MCG/ACT IN AERS: 2.0000 | INHALATION_SPRAY | Freq: Two times a day (BID) | RESPIRATORY_TRACT | Status: DC

## 2015-09-07 MED ORDER — SENNA 8.6 MG PO TABS
2.0000 | ORAL_TABLET | Freq: Every day | ORAL | Status: DC
  Administered 2015-09-07: 17.2 mg via ORAL
  Filled 2015-09-07: qty 2

## 2015-09-07 MED ORDER — OXYCODONE HCL 5 MG PO TABS
10.0000 mg | ORAL_TABLET | ORAL | Status: DC | PRN
  Administered 2015-09-07 – 2015-09-08 (×6): 10 mg via ORAL
  Filled 2015-09-07 (×6): qty 2

## 2015-09-07 MED ORDER — ONDANSETRON HCL 4 MG PO TABS: 4.0000 mg | ORAL_TABLET | Freq: Four times a day (QID) | ORAL | Status: DC | PRN

## 2015-09-07 MED ORDER — ALPRAZOLAM 1 MG PO TABS
2.0000 mg | ORAL_TABLET | Freq: Two times a day (BID) | ORAL | Status: DC | PRN
  Administered 2015-09-08: 2 mg via ORAL
  Filled 2015-09-07: qty 2

## 2015-09-07 MED ORDER — CALCIUM CARBONATE ANTACID 500 MG PO CHEW
1.0000 | CHEWABLE_TABLET | Freq: Three times a day (TID) | ORAL | Status: DC | PRN
  Administered 2015-09-07: 23:00:00 400 mg via ORAL
  Filled 2015-09-07 (×2): qty 1

## 2015-09-07 MED ORDER — INSULIN ASPART 100 UNIT/ML ~~LOC~~ SOLN
0.0000 [IU] | Freq: Three times a day (TID) | SUBCUTANEOUS | Status: DC
  Administered 2015-09-07: 2 [IU] via SUBCUTANEOUS
  Administered 2015-09-07: 1 [IU] via SUBCUTANEOUS
  Administered 2015-09-08: 2 [IU] via SUBCUTANEOUS
  Administered 2015-09-08: 1 [IU] via SUBCUTANEOUS
  Filled 2015-09-07: qty 1
  Filled 2015-09-07: qty 2
  Filled 2015-09-07: qty 1
  Filled 2015-09-07: qty 2

## 2015-09-07 MED ORDER — VILAZODONE HCL 20 MG PO TABS
60.0000 mg | ORAL_TABLET | Freq: Every day | ORAL | Status: DC
  Administered 2015-09-08: 08:00:00 60 mg via ORAL
  Filled 2015-09-07: qty 3

## 2015-09-07 MED ORDER — ACETAMINOPHEN 325 MG PO TABS: 650.0000 mg | ORAL_TABLET | Freq: Four times a day (QID) | ORAL | Status: DC | PRN

## 2015-09-07 MED ORDER — INSULIN ASPART 100 UNIT/ML ~~LOC~~ SOLN: 0.0000 [IU] | Freq: Every day | SUBCUTANEOUS | Status: DC

## 2015-09-07 MED ORDER — TIZANIDINE HCL 4 MG PO TABS: 2.0000 mg | ORAL_TABLET | Freq: Three times a day (TID) | ORAL | Status: DC | PRN

## 2015-09-07 MED ORDER — DOCUSATE SODIUM 100 MG PO CAPS
100.0000 mg | ORAL_CAPSULE | Freq: Two times a day (BID) | ORAL | Status: DC
  Administered 2015-09-07 – 2015-09-08 (×3): 100 mg via ORAL
  Filled 2015-09-07 (×3): qty 1

## 2015-09-07 MED ORDER — LISINOPRIL 10 MG PO TABS
10.0000 mg | ORAL_TABLET | Freq: Every day | ORAL | Status: DC
  Administered 2015-09-07 – 2015-09-08 (×2): 10 mg via ORAL
  Filled 2015-09-07 (×2): qty 1

## 2015-09-07 MED ORDER — ENOXAPARIN SODIUM 40 MG/0.4ML ~~LOC~~ SOLN
40.0000 mg | Freq: Two times a day (BID) | SUBCUTANEOUS | Status: DC
  Administered 2015-09-07 – 2015-09-08 (×4): 40 mg via SUBCUTANEOUS
  Filled 2015-09-07 (×4): qty 0.4

## 2015-09-07 MED ORDER — TRAZODONE HCL 100 MG PO TABS
300.0000 mg | ORAL_TABLET | Freq: Every day | ORAL | Status: DC
  Administered 2015-09-07: 02:00:00 300 mg via ORAL
  Filled 2015-09-07: qty 3
  Filled 2015-09-07: qty 1

## 2015-09-07 MED ORDER — OXYCODONE HCL 5 MG PO TABS: 5.0000 mg | ORAL_TABLET | ORAL | Status: DC | PRN

## 2015-09-07 MED ORDER — POLYETHYLENE GLYCOL 3350 17 G PO PACK: 17.0000 g | PACK | Freq: Every day | ORAL | Status: DC | PRN

## 2015-09-07 MED ORDER — VILAZODONE HCL 20 MG PO TABS: 20.0000 mg | ORAL_TABLET | Freq: Every day | ORAL | Status: DC

## 2015-09-07 MED ORDER — VILAZODONE HCL 40 MG PO TABS
40.0000 mg | ORAL_TABLET | Freq: Every day | ORAL | Status: DC
  Administered 2015-09-07: 40 mg via ORAL
  Filled 2015-09-07 (×2): qty 1

## 2015-09-07 MED ORDER — ASPIRIN EC 81 MG PO TBEC
81.0000 mg | DELAYED_RELEASE_TABLET | Freq: Every day | ORAL | Status: DC
  Administered 2015-09-07 – 2015-09-08 (×2): 81 mg via ORAL
  Filled 2015-09-07 (×2): qty 1

## 2015-09-07 MED ORDER — ALBUTEROL SULFATE (2.5 MG/3ML) 0.083% IN NEBU: 3.0000 mL | INHALATION_SOLUTION | Freq: Four times a day (QID) | RESPIRATORY_TRACT | Status: DC | PRN

## 2015-09-07 MED ORDER — OXYCODONE HCL ER 20 MG PO T12A
40.0000 mg | EXTENDED_RELEASE_TABLET | Freq: Two times a day (BID) | ORAL | Status: DC
  Administered 2015-09-07 – 2015-09-08 (×3): 40 mg via ORAL
  Filled 2015-09-07 (×3): qty 2

## 2015-09-07 MED ORDER — SODIUM CHLORIDE 0.9% FLUSH
3.0000 mL | Freq: Two times a day (BID) | INTRAVENOUS | Status: DC
  Administered 2015-09-07 – 2015-09-08 (×3): 3 mL via INTRAVENOUS

## 2015-09-07 MED ORDER — TRAZODONE HCL 100 MG PO TABS
300.0000 mg | ORAL_TABLET | Freq: Every evening | ORAL | Status: DC | PRN
  Administered 2015-09-07: 22:00:00 300 mg via ORAL
  Filled 2015-09-07: qty 3

## 2015-09-07 MED ORDER — POTASSIUM CHLORIDE CRYS ER 20 MEQ PO TBCR
40.0000 meq | EXTENDED_RELEASE_TABLET | ORAL | Status: AC
  Administered 2015-09-07 (×3): 40 meq via ORAL
  Filled 2015-09-07 (×4): qty 2

## 2015-09-07 MED ORDER — HYDROMORPHONE HCL 1 MG/ML IJ SOLN
1.0000 mg | INTRAMUSCULAR | Status: DC | PRN
  Administered 2015-09-07 – 2015-09-08 (×7): 1 mg via INTRAVENOUS
  Filled 2015-09-07 (×7): qty 1

## 2015-09-07 MED ORDER — ONDANSETRON HCL 4 MG/2ML IJ SOLN: 4.0000 mg | Freq: Four times a day (QID) | INTRAMUSCULAR | Status: DC | PRN

## 2015-09-07 MED ORDER — BUDESONIDE 0.25 MG/2ML IN SUSP
0.2500 mg | Freq: Two times a day (BID) | RESPIRATORY_TRACT | Status: DC
  Administered 2015-09-07 – 2015-09-08 (×3): 0.25 mg via RESPIRATORY_TRACT
  Filled 2015-09-07 (×3): qty 2

## 2015-09-07 MED ORDER — BUPROPION HCL ER (SR) 150 MG PO TB12
150.0000 mg | ORAL_TABLET | Freq: Every day | ORAL | Status: DC
  Administered 2015-09-07 – 2015-09-08 (×2): 150 mg via ORAL
  Filled 2015-09-07 (×2): qty 1

## 2015-09-07 NOTE — Progress Notes (Signed)
[facility] [place] NAME: Annette [name]    MR#:  [id number]  DATE OF BIRTH:  July 12, 1960  SUBJECTIVE:  CHIEF COMPLAINT:   Chief Complaint  Patient presents with  . Abdominal Pain   Anxious and in distress due to pain. Abdominal pain No vomiting Constipated  REVIEW OF SYSTEMS:    Review of Systems  Constitutional: Positive for malaise/fatigue. Negative for chills and fever.  HENT: Negative for sore throat.   Eyes: Negative for blurred vision, double vision and pain.  Respiratory: Negative for cough, hemoptysis, shortness of breath and wheezing.   Cardiovascular: Negative for chest pain, palpitations, orthopnea and leg swelling.  Gastrointestinal: Positive for abdominal pain and constipation. Negative for diarrhea, heartburn, nausea and vomiting.  Genitourinary: Negative for dysuria and hematuria.  Musculoskeletal: Positive for back pain and joint pain.  Skin: Negative for rash.  Neurological: Positive for weakness. Negative for sensory change, speech change, focal weakness and headaches.  Endo/Heme/Allergies: Does not bruise/bleed easily.  Psychiatric/Behavioral: Negative for depression. The patient is not nervous/anxious.     DRUG ALLERGIES:   Allergies  Allergen Reactions  . Iodides Swelling  . Iodine Swelling  . Shellfish Allergy Swelling    shrimp causes swelling  . Shellfish-Derived Products Swelling  . Iodinated Diagnostic Agents Swelling  . Povidone Iodine Swelling    eyes  . Povidone-Iodine Swelling    VITALS:  Blood pressure (!) 177/85, pulse 88, temperature 98.8 F (37.1 C), temperature source Oral, resp. rate 18, height 5\' 5"  (1.651 m), weight 126.9 kg (279 lb 11.2 oz), SpO2 97 %.  PHYSICAL EXAMINATION:   Physical Exam  GENERAL:  55 y.o.-year-old patient lying in the bed with no acute distress. Obese EYES: Pupils equal, round, reactive to light and accommodation. No scleral icterus. Extraocular  muscles intact.  HEENT: Head atraumatic, normocephalic. Oropharynx and nasopharynx clear.  NECK:  Supple, no jugular venous distention. No thyroid enlargement, no tenderness.  LUNGS: Normal breath sounds bilaterally, no wheezing, rales, rhonchi. No use of accessory muscles of respiration.  CARDIOVASCULAR: S1, S2 normal. No murmurs, rubs, or gallops.  ABDOMEN: Soft, nontender, nondistended. Bowel sounds present. No organomegaly or mass.  EXTREMITIES: No cyanosis, clubbing or edema b/l.    NEUROLOGIC: Cranial nerves II through XII are intact. No focal Motor or sensory deficits b/l.   PSYCHIATRIC: The patient is alert and oriented x 3.  SKIN: No obvious rash, lesion, or ulcer.   LABORATORY PANEL:   CBC  Recent Labs Lab 09/07/15 0511  WBC 10.6  HGB 12.4  HCT 36.8  PLT 169   ------------------------------------------------------------------------------------------------------------------ Chemistries   Recent Labs Lab 09/06/15 1846 09/07/15 0511  NA 135 139  K 3.3* 2.8*  CL 94* 96*  CO2 28 32  GLUCOSE 195* 147*  BUN 13 10  CREATININE 0.96 0.91  CALCIUM 9.2 8.7*  MG  --  2.1  AST 33  --   ALT 23  --   ALKPHOS 88  --   BILITOT 0.8  --    ------------------------------------------------------------------------------------------------------------------  Cardiac Enzymes No results for input(s): TROPONINI in the last 168 hours. ------------------------------------------------------------------------------------------------------------------  RADIOLOGY:  Ct Abdomen Pelvis Wo Contrast  Result Date: 09/06/2015 CLINICAL DATA:  Lower abdominal pain about the mid umbilicus for 3 days, worsening. History of metastatic uterine carcinoma. EXAM: CT ABDOMEN AND PELVIS WITHOUT CONTRAST TECHNIQUE: Multidetector CT imaging of the abdomen and pelvis was performed following the standard protocol without IV contrast. COMPARISON:  CT abdomen and pelvis  08/23/2015 and 07/23/2014. FINDINGS:  Innumerable pulmonary nodules are seen in the lower lobes bilaterally consistent with metastatic disease. Largest nodule in the right lower lobe measures 1.6 cm, unchanged. A nodule in the left lower lobe which had measured 1.0 cm in diameter today measures 1.5 cm on image 8. Several small new lesions are identified. No pleural or pericardial effusion. Innumerable hepatic metastases are identified as on the prior examinations. Index lesion in the right hepatic lobe measuring 3.1 cm on image 19 is unchanged since the most recent study. A second index lesion near the dome of the liver on image 9 measures 1.6 cm compared to 1.0 cm on the prior exam. The spleen, adrenal glands and pancreas appear normal. Since the prior study, the patient has developed moderate bilateral hydronephrosis, worse on the left. Cause for the obstruction is the patient's markedly enlarged uterus consistent with history of carcinoma. The uterus today measures 13.5 x 13.2 cm in the axial plane on image 72 compared to 11.5 x 12.7 cm on the prior examination. There is mass effect on the urinary bladder from the uterus. Calcified fibroids are noted. No lymphadenopathy is identified. Previously seen soft tissue density along the posterior aspect of the uterus on the left can no longer be separately identified. No evidence of bowel obstruction is seen. The rectosigmoid colon is difficult to visualize due to the patient's uterine mass. No fluid collection is identified. No lytic or sclerotic bony lesion is seen. IMPRESSION: Progressive metastatic uterine carcinoma with increase in the size and number of pulmonary nodules and increase the size of the hepatic metastases. Moderate bilateral hydronephrosis due to obstruction from the patient's uterine mass is new since the CT 2 weeks ago. Electronically Signed   By: Inge Rise M.D.   On: 09/06/2015 21:28     ASSESSMENT AND PLAN:   Chronic pain, abdomen and back - worsening progressively She  has been on Nucynta and Oxycodone per Dr. Primus Bravo. We will replace Nucynta with Oxycontin as it is not available here. Dilaudid IV PRN    Diabetes mellitus (HCC) - sliding scale insulin with corresponding glucose checks and carb modified diet    Malignant neoplasm of overlapping sites of body of uterus [facility]) - following with oncology Dr. Rogue Bussing will see patient    HTN (hypertension) - continue home meds    Bilateral hydronephrosis - new, due to traction from uterine mass, creatinine currently stable, oncology consult as above Discussed with urology. Patient has good urine output and BUN/Cr normal. Needs OP follow up.    HLD (hyperlipidemia) - continue home meds  All the records are reviewed and case discussed with Care Management/Social Workerr. Management plans discussed with the patient, family and they are in agreement.  CODE STATUS: FULL CODE  DVT Prophylaxis: SCDs  TOTAL TIME TAKING CARE OF THIS PATIENT: 30 minutes.   POSSIBLE D/C IN 1-2 DAYS, DEPENDING ON CLINICAL CONDITION.  Hillary Bow R M.D on 09/07/2015 at 2:30 PM  Between 7am to 6pm - Pager - [phone]  After 6pm go to [email or web address] - password [email or web address] [facility]  Office  [phone]  CC: Primary care physician; Ellamae Sia, MD  Note: This dictation was prepared with Dragon dictation along with smaller phrase technology. Any transcriptional errors that result from this process are unintentional.

## 2015-09-07 NOTE — Care Management Obs Status (Signed)
[place] NOTIFICATION   Patient Details  Name: NORELY SPOERL MRN: [id number] Date of Birth: 03-27-1960   Medicare Observation Status Notification Given:  Yes    Shelbie Ammons, RN 09/07/2015, 10:59 AM

## 2015-09-07 NOTE — Care Management (Signed)
Admitted to [facility] with the diagnosis of intractable pain. Lives alone. Mother is Clara [phone]). No home Health. No skilled facility. Uses no aids for ambulation. Self feed, self dress, needs help with baths. 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 [employer] this year. Appetite is better. Prescriptions are filled at [facility] in [place]. Information and application given regarding [facility] program. Nursing assistant will transport if discharged in the morning time. Shelbie Ammons RN MSN CCM Care Management [phone]
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
## 2015-09-07 NOTE — Telephone Encounter (Signed)
md made aware of consult

## 2015-09-07 NOTE — Progress Notes (Signed)
CSW received a consult due to patient needing medication assistance. CSW informed RNCM. CSW is signing off but is available if a CSW need were to arise.  Ernest Pine, MSW, LCSW, [place] Clinical Social Worker [phone]

## 2015-09-07 NOTE — ED Notes (Signed)
Report called to receiving nurse Asencion Partridge

## 2015-09-07 NOTE — Consult Note (Signed)
Consultation Note Date: 09/07/2015   Patient Name: Annette [name]  DOB: [birth year]  MRN: [id number]  Age / Sex: 55 y.o., female  PCP: Ellamae Sia, MD Referring Physician: Hillary Bow, MD  Reason for Consultation: Establishing goals of care, Pain control and Psychosocial/spiritual support  HPI/Patient Profile: 55 y.o. female admitted on 09/06/2015 with  Intractable abdominal pain. Patient was recently admitted here to the hospital for the same thing within the last couple weeks and states that she's had a difficult time managing her pain since then.  Lab workup here is initially within normal limits, but imaging shows new bilateral hydronephrosis due to her uterine mass.  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 Dr see oncologist Dr Nelwyn Salisbury         She has lomg history of chronic back pain on chronic narcotic pain medication managed by Dr Gerald Stabs  Now with worsening of chronic pain- patient has multiple worsening lesions in the liver- which could potentially explain her worsening pain in the abdomen.  Unfortunate situation, patient faces many difficult decisions, with limited social support.  Faces treatment options, advanced directives and anticipatory care needs and her own mortality   Clinical Assessment and Goals of Care:  This NP Wadie Lessen reviewed medical records, received report from team, assessed the patient and then meet at the patient's bedside along with  to discuss, diagnosis  GOC, disposition and options.   A discussion was had today regarding importance of documentation of advanced directives.   Values and goals of care important to patient and family were attempted to be elicited.  Concept of Palliative Care was discussed and its role in a holistic treatment plan, specific to symptom management and emotional support.   Questions and concerns addressed.  PMT will continue to support holistically.   SUMMARY OF RECOMMENDATIONS    Code Status/Advance Care Planning:  Full code   Symptom Management:   Discussed with Dr Darvin Neighbours and pharmacy continuation of her home meds specific to [place] feels that medication will be available in the morning.  Oxycodone ER 40 mg po every 12 hours at this time  Need input from Dr Josefa Half for continued pain ment  issues  Palliative Prophylaxis:    Bowel Regimen and Frequent Pain Assessment   Psycho-social/Spiritual:      Desire for further Chaplaincy support:yes  Patient easily shared with me some of her thoughts and fears involved in her current medical situation.   Prognosis:   Unable to determine, dependant on response to treatemtn and desire for life prolonging   Discharge Planning: Likely home with previous services     Primary Diagnoses: Present on Admission: . Malignant neoplasm of overlapping sites of body of uterus ([place]) . HTN (hypertension) . HLD (hyperlipidemia) . Intractable pain . Bilateral hydronephrosis   I have reviewed the medical record, interviewed the patient and family, and examined the patient. The following aspects are pertinent.  Past Medical History:  Diagnosis  Date  . Asthma   . Back pain   . DDD (degenerative disc disease), lumbar   . Depression   . Diabetes mellitus without complication ([place])   . Hyperlipidemia   . Hypertension   . Insomnia   . Menopausal syndrome   . Obesity   . Onychomycosis   . Peripheral neuropathy ([place])   . Renal insufficiency    Social History   Social History  . Marital status: Single    Spouse name: N/A  . Number of children: N/A  . Years of education: N/A   Social History Main Topics  . Smoking status: Former Smoker    Quit date: 08/19/2015  . Smokeless tobacco: Never Used  . Alcohol use No  . Drug use: No  . Sexual activity: Not Asked   Other Topics Concern    . None   Social History Narrative  . None   Family History  Problem Relation Age of Onset  . COPD Mother   . Sleep apnea Mother   . Alcohol abuse Mother   . Depression Mother   . Stroke Mother   . Heart disease Father   . Heart disease Sister    Scheduled Meds: . aspirin EC  81 mg Oral Daily  . budesonide (PULMICORT) nebulizer solution  0.25 mg Nebulization BID  . enoxaparin (LOVENOX) injection  40 mg Subcutaneous BID  . gabapentin  600 mg Oral BID  . insulin aspart  0-5 Units Subcutaneous QHS  . insulin aspart  0-9 Units Subcutaneous TID WC  . lisinopril  10 mg Oral Daily  . potassium chloride  40 mEq Oral Q4H  . pravastatin  40 mg Oral Daily  . sodium chloride flush  3 mL Intravenous Q12H  . traZODone  300 mg Oral QHS  . Vilazodone HCl  40 mg Oral Q breakfast   Continuous Infusions:  PRN Meds:.acetaminophen **OR** acetaminophen, albuterol, HYDROmorphone (DILAUDID) injection, ondansetron **OR** ondansetron (ZOFRAN) IV, oxyCODONE, tiZANidine Medications Prior to Admission:  Prior to Admission medications   Medication Sig Start Date End Date Taking? Authorizing Provider  albuterol (PROAIR HFA) 108 (90 Base) MCG/ACT inhaler Inhale 1-2 puffs into the lungs every 6 (six) hours as needed for wheezing.    Yes Historical Provider, MD  alprazolam Duanne Moron) 2 MG tablet Take 2 mg by mouth 2 (two) times daily as needed for anxiety.    Yes Historical Provider, MD  aspirin EC 81 MG tablet Take 81 mg by mouth.   Yes Historical Provider, MD  beclomethasone (QVAR) 40 MCG/ACT inhaler Inhale 2 puffs into the lungs 2 (two) times daily. Reported on 05/26/2015   Yes Historical Provider, MD  buPROPion (WELLBUTRIN SR) 150 MG 12 hr tablet Take 150 mg by mouth daily.   Yes Historical Provider, MD  docusate sodium (COLACE) 100 MG capsule Take 100 mg by mouth 2 (two) times daily.   Yes Historical Provider, MD  gabapentin (NEURONTIN) 600 MG tablet Limit 1 tablet by mouth twice per day if tolerated 09/02/15   Yes Mohammed Kindle, MD  hydrochlorothiazide (HYDRODIURIL) 25 MG tablet Take 25 mg by mouth daily.    Yes Historical Provider, MD  ibuprofen (ADVIL,MOTRIN) 800 MG tablet Take 800 mg by mouth every 8 (eight) hours as needed for mild pain or moderate pain.    Yes Historical Provider, MD  lactulose (CHRONULAC) 10 GM/15ML solution Take 30 g by mouth 2 (two) times daily as needed for mild constipation or moderate constipation.    Yes Historical Provider, MD  lisinopril (PRINIVIL,ZESTRIL) 10 MG tablet Take 10 mg by mouth daily.    Yes Historical Provider, MD  metFORMIN (GLUCOPHAGE-XR) 500 MG 24 hr tablet Take 500 mg by mouth daily with breakfast.   Yes Historical Provider, MD  ondansetron (ZOFRAN) 4 MG tablet Take 4 mg by mouth every 8 (eight) hours as needed for nausea or vomiting.    Yes Historical Provider, MD  Oxycodone HCl 10 MG TABS Limit 1 tablet by mouth every 4 hours as needed for breakthrough pain while taking Nucynta if tolerated 09/02/15  Yes Mohammed Kindle, MD  pravastatin (PRAVACHOL) 40 MG tablet Take 40 mg by mouth daily.    Yes Historical Provider, MD  senna-docusate (SENOKOT-S) 8.6-50 MG tablet Take 2 tablets by mouth daily. 08/28/15  Yes Bettey Costa, MD  silver sulfADIAZINE (SILVADENE) 1 % cream Apply topically daily. Patient taking differently: Apply 1 application topically daily.  08/28/15  Yes Bettey Costa, MD  Tapentadol HCl (NUCYNTA ER) 150 MG TB12 Limit 1 tab by mouth  every 8 -12 hours IF tolerated   (Note pill is now 150 mg and much stronger) Patient taking differently: Take 150 mg by mouth 2 (two) times daily. Limit 1 tab by mouth  every 8 -12 hours IF tolerated   (Note pill is now 150 mg and much stronger) 09/02/15  Yes Mohammed Kindle, MD  tiZANidine (ZANAFLEX) 2 MG tablet Limit 1 tablet by mouth 2-4 times per day if tolerated 09/02/15  Yes Mohammed Kindle, MD  traZODone (DESYREL) 150 MG tablet Take 300 mg by mouth at bedtime as needed for sleep.    Yes Historical Provider, MD  Vilazodone  HCl (VIIBRYD) 20 MG TABS Take 20 mg by mouth daily. Takes with 40mg  for total of 60mg  daily   Yes Historical Provider, MD  Vilazodone HCl (VIIBRYD) 40 MG TABS Take 40 mg by mouth daily. Takes with 20mg  for total of 60mg  daily   Yes Historical Provider, MD   Allergies  Allergen Reactions  . Iodides Swelling  . Iodine Swelling  . Shellfish Allergy Swelling    shrimp causes swelling  . Shellfish-Derived Products Swelling  . Iodinated Diagnostic Agents Swelling  . Povidone Iodine Swelling    eyes  . Povidone-Iodine Swelling   Review of Systems  Constitutional: Positive for fatigue.  Gastrointestinal: Positive for abdominal pain.  Psychiatric/Behavioral:       H/o ongoing psych support for depression and anxiety    Physical Exam  Constitutional: She appears well-developed.  Cardiovascular: Normal rate, regular rhythm and normal heart sounds.   Pulmonary/Chest: Effort normal and breath sounds normal.  Neurological: She is alert.    Vital Signs: BP (!) 141/71 (BP Location: Right Arm)   Pulse 95   Temp 99.2 F (37.3 C) (Oral)   Resp 18   Ht 5\' 5"  (1.651 m)   Wt 126.9 kg (279 lb 11.2 oz)   LMP  (Approximate) Comment: 2007  SpO2 95%   BMI 46.54 kg/m  Pain Assessment: 0-10   Pain Score: 7    SpO2: SpO2: 95 % O2 Device:SpO2: 95 % O2 Flow Rate: .   IO: Intake/output summary:  Intake/Output Summary (Last 24 hours) at 09/07/15 1228 Last data filed at 09/07/15 0900  Gross per 24 hour  Intake              240 ml  Output              250 ml  Net              -  10 ml    LBM: Last BM Date: 09/07/15 Baseline Weight: Weight: 127.5 kg (281 lb) Most recent weight: Weight: 126.9 kg (279 lb 11.2 oz)     Palliative Assessment/Data:   Discussed with Dr Darvin Neighbours and pharmacy  Time In: 1500 Time Out: 1625 Time Total: 75 min Greater than 50%  of this time was spent counseling and coordinating care related to the above assessment and plan.  Signed by: Wadie Lessen, NP   Please  contact Palliative Medicine Team phone at [phone] for questions and concerns.  For individual provider: See Shea Evans

## 2015-09-07 NOTE — Telephone Encounter (Signed)
Patient is in room 110, enlarged uterine mass

## 2015-09-07 NOTE — Progress Notes (Signed)
   09/07/15 0930  Clinical Encounter Type  Visited With Patient  Visit Type Follow-up;Spiritual support  Referral From Nurse  Consult/Referral To Chaplain  Spiritual Encounters  Spiritual Needs Prayer;Ritual  Stress Factors  Patient Stress Factors Exhausted;Health changes  Family Stress Factors Not reviewed  Chap. Denton Ar and I visited w/[name]. [name] expressed that she feels good most of the time, but she is hurting and tired at this time. She is aprehensive of her future, but continues to express her commitment to overcome her cancer. We reassured her that we will journey with her. She shared that sometimes she wants company, but does not have the strength to carry on a conversation. We discussed ways to advocate for her needs, to advise her healthcare team of how she feels and ask questions even if she feels that she is annoying others. The healthcare team wants to know what is happening with her. She requested unction (annointing for the sick with Holy Oil) and we offered her that Sacrament. Chap. Denielle Bayard G. [place]

## 2015-09-08 ENCOUNTER — Ambulatory Visit: Payer: Medicare Other | Admitting: Internal Medicine

## 2015-09-08 ENCOUNTER — Ambulatory Visit: Payer: Medicare Other

## 2015-09-08 ENCOUNTER — Other Ambulatory Visit: Payer: Medicare Other

## 2015-09-08 DIAGNOSIS — R1084 Generalized abdominal pain: Secondary | ICD-10-CM

## 2015-09-08 DIAGNOSIS — Z515 Encounter for palliative care: Secondary | ICD-10-CM

## 2015-09-08 DIAGNOSIS — N1330 Unspecified hydronephrosis: Secondary | ICD-10-CM | POA: Diagnosis not present

## 2015-09-08 DIAGNOSIS — G8929 Other chronic pain: Secondary | ICD-10-CM

## 2015-09-08 LAB — BASIC METABOLIC PANEL
Anion gap: 12 (ref 5–15)
BUN: 8 mg/dL (ref 6–20)
CHLORIDE: 96 mmol/L — AB (ref 101–111)
CO2: 30 mmol/L (ref 22–32)
Calcium: 8.9 mg/dL (ref 8.9–10.3)
Creatinine, Ser: 1.02 mg/dL — ABNORMAL HIGH (ref 0.44–1.00)
GFR calc Af Amer: >60 mL/min (ref >60)
GFR calc non Af Amer: >60 mL/min (ref >60)
GLUCOSE: 144 mg/dL — AB (ref 65–99)
POTASSIUM: 4.1 mmol/L (ref 3.5–5.1)
Sodium: 138 mmol/L (ref 135–145)

## 2015-09-08 LAB — GLUCOSE, CAPILLARY
GLUCOSE-CAPILLARY: 149 mg/dL — AB (ref 65–99)
Glucose-Capillary: 154 mg/dL — ABNORMAL HIGH (ref 65–99)
Glucose-Capillary: 138 mg/dL — ABNORMAL HIGH (ref 65–99)

## 2015-09-08 MED ORDER — SODIUM CHLORIDE 0.9 % IV SOLN
INTRAVENOUS | Status: DC
  Administered 2015-09-08: 12:00:00 via INTRAVENOUS

## 2015-09-08 MED ORDER — OXYCODONE HCL 5 MG PO TABS
20.0000 mg | ORAL_TABLET | ORAL | Status: DC | PRN
  Administered 2015-09-08 (×2): 20 mg via ORAL
  Filled 2015-09-08 (×2): qty 4

## 2015-09-08 MED ORDER — BISACODYL 5 MG PO TBEC
10.0000 mg | DELAYED_RELEASE_TABLET | Freq: Once | ORAL | Status: AC
  Administered 2015-09-08: 10:00:00 10 mg via ORAL
  Filled 2015-09-08: qty 2

## 2015-09-08 MED ORDER — OXYCODONE HCL 10 MG PO TABS: 20.0000 mg | ORAL_TABLET | ORAL | 0 refills | Status: DC | PRN

## 2015-09-08 NOTE — Progress Notes (Signed)
Patient is alert and oriented x 4, c/o abdominal pain improved with prn medications, small bm during shift, fair appetite, on room air, denies n/v, vital signs stable, afebrile, MD notified that patient heart rate becomes tachycardic with ambulation up to 150's that resolves with rest, 500 cc bolus of NS given, following bolus heart rate only increased to 117 with ambulation and returned to normal with rest, MD notified and is okay for patient to be discharged. Pt is d/c to home and recommended to f/u with pcp in 2 days, patient has an appt. with cancer center on 8/4 for lab work and infusion, patient is aware. Chaplain consulted with patient to meet emotional needs. Friend at bedside, patient occasionally sleeping in between care, ambulatory independently. Medications from pharmacy returned to patient. Patient pushed to visitor entrance via nursing staff, patient reports understanding of d/c instructions and was provided unit number in case she has questions. Oxycodone IR given to patient prior to discharge per patient request.

## 2015-09-08 NOTE — Progress Notes (Signed)
   09/08/15 1230  Clinical Encounter Type  Visited With Patient  Visit Type Follow-up  Referral From Chaplain  Spiritual Encounters  Spiritual Needs Prayer;Ritual  Stress Factors  Patient Stress Factors Health changes  Chap. D[place] Ar and I visited [place] and sat with her to discussed anxiety sources. Provided her with contemplative materials and instruction to aid with anxiety coping. Provided her with a prayer shawl to help her focus on her spiritual grounding. Chap. Nguyet Mercer G. [place]

## 2015-09-08 NOTE — Consult Note (Signed)
[facility] CONSULT NOTE  Patient Care Team: Harriett Neita Carp, MD as PCP - General (Internal Medicine) Clent Jacks, RN as Registered Nurse  CHIEF COMPLAINTS/PURPOSE OF CONSULTATION:  PECOMA-metastatic/intractable pain   HISTORY OF PRESENTING ILLNESS:  Annette [name] 55 y.o.  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  Patient had multiple admission the hospital in the last few weeks- on worsening pain. The CT of the abdomen and pelvis showed multiple increasing liver lesions; and also lung lesions.  Patient is currently on oxycodone; and OxyContin and needing IV Dilaudid.  Oncology has been consult for further evaluation and recommendations for her metastatic malignancy. Patient is anxious. No nausea no vomiting. Chronic constipation.  ROS: A complete 10 point review of system is done which is negative except mentioned above in history of present illness  MEDICAL HISTORY:  Past Medical History:  Diagnosis Date  . Asthma   . Back pain   . DDD (degenerative disc disease), lumbar   . Depression   . Diabetes mellitus without complication ([place])   . Hyperlipidemia   . Hypertension   . Insomnia   . Menopausal syndrome   . Obesity   . Onychomycosis   . Peripheral neuropathy ([place])   . Renal insufficiency     SURGICAL HISTORY: Past Surgical History:  Procedure Laterality Date  . COSMETIC SURGERY    . JOINT REPLACEMENT Bilateral [id number]  . LIVER BIOPSY    . REDUCTION MAMMAPLASTY Bilateral 12/15/2013  . SHOULDER SURGERY Left     SOCIAL HISTORY: Social History   Social History  . Marital status: Single    Spouse name: N/A  . Number of children: N/A  . Years of education: N/A   Occupational History  . Not on file.   Social History Main Topics  . Smoking status: Former Smoker    Quit date:  08/19/2015  . Smokeless tobacco: Never Used  . Alcohol use No  . Drug use: No  . Sexual activity: Not on file   Other Topics Concern  . Not on file   Social History Narrative  . No narrative on file    FAMILY HISTORY: Family History  Problem Relation Age of Onset  . COPD Mother   . Sleep apnea Mother   . Alcohol abuse Mother   . Depression Mother   . Stroke Mother   . Heart disease Father   . Heart disease Sister     ALLERGIES:  is allergic to iodides; iodine; shellfish allergy; shellfish-derived products; iodinated diagnostic agents; povidone iodine; and povidone-iodine.  MEDICATIONS:  Current Facility-Administered Medications  Medication Dose Route Frequency Provider Last Rate Last Dose  . acetaminophen (TYLENOL) tablet 650 mg  650 mg Oral Q6H PRN Lance Coon, MD       Or  . acetaminophen (TYLENOL) suppository 650 mg  650 mg Rectal Q6H PRN Lance Coon, MD      . albuterol (PROVENTIL) (2.5 MG/3ML) 0.083% nebulizer solution 3 mL  3 mL Inhalation Q6H PRN Lance Coon, MD      . ALPRAZolam Duanne Moron) tablet 2 mg  2 mg Oral BID PRN Hillary Bow, MD      . aspirin EC tablet 81 mg  81 mg Oral Daily Lance Coon, MD   81 mg at 09/07/15 [id number]  . budesonide (PULMICORT) nebulizer solution 0.25 mg  0.25  mg Nebulization BID Lance Coon, MD   0.25 mg at 09/08/15 0741  . buPROPion [facility] SR) 12 hr tablet 150 mg  150 mg Oral Daily Hillary Bow, MD   150 mg at 09/07/15 1632  . calcium carbonate (TUMS - dosed in mg elemental calcium) chewable tablet 200-400 mg of elemental calcium  1-2 tablet Oral TID PRN Lance Coon, MD   400 mg of elemental calcium at 09/07/15 2250  . docusate sodium (COLACE) capsule 100 mg  100 mg Oral BID Hillary Bow, MD   100 mg at 09/07/15 2040  . enoxaparin (LOVENOX) injection 40 mg  40 mg Subcutaneous BID Lance Coon, MD   40 mg at 09/07/15 2138  . gabapentin (NEURONTIN) capsule 600 mg  600 mg Oral BID Lance Coon, MD   600 mg at 09/07/15 2040  .  HYDROmorphone (DILAUDID) injection 1 mg  1 mg Intravenous Q4H PRN Lance Coon, MD   1 mg at 09/08/15 0707  . insulin aspart (novoLOG) injection 0-5 Units  0-5 Units Subcutaneous QHS Lance Coon, MD      . insulin aspart (novoLOG) injection 0-9 Units  0-9 Units Subcutaneous TID WC Lance Coon, MD   2 Units at 09/07/15 1222  . lisinopril (PRINIVIL,ZESTRIL) tablet 10 mg  10 mg Oral Daily Lance Coon, MD   10 mg at 09/07/15 0807  . ondansetron (ZOFRAN) tablet 4 mg  4 mg Oral Q6H PRN Lance Coon, MD       Or  . ondansetron [facility]) injection 4 mg  4 mg Intravenous Q6H PRN Lance Coon, MD      . oxyCODONE (Oxy IR/ROXICODONE) immediate release tablet 10 mg  10 mg Oral Q4H PRN Lance Coon, MD   10 mg at 09/07/15 2040  . oxyCODONE (OXYCONTIN) 12 hr tablet 40 mg  40 mg Oral Q12H Hillary Bow, MD   40 mg at 09/07/15 2138  . polyethylene glycol (MIRALAX / GLYCOLAX) packet 17 g  17 g Oral Daily PRN Srikar Sudini, MD      . pravastatin (PRAVACHOL) tablet 40 mg  40 mg Oral Daily Lance Coon, MD   40 mg at 09/07/15 0806  . senna (SENOKOT) tablet 17.2 mg  2 tablet Oral QHS Hillary Bow, MD   17.2 mg at 09/07/15 2040  . sodium chloride flush (NS) 0.9 % injection 3 mL  3 mL Intravenous Q12H Lance Coon, MD   3 mL at 09/07/15 2232  . tiZANidine (ZANAFLEX) tablet 2 mg  2 mg Oral Q8H PRN Lance Coon, MD      . traZODone (DESYREL) tablet 300 mg  300 mg Oral QHS PRN Hillary Bow, MD   300 mg at 09/07/15 2138  . Vilazodone HCl TABS 60 mg  60 mg Oral Q breakfast Srikar Sudini, MD          .  PHYSICAL EXAMINATION:  Vitals:   09/07/15 2105 09/08/15 0447  BP: (!) 143/52 (!) 155/68  Pulse: 82 91  Resp: 17 18  Temp: 98.8 F (37.1 C) 98.7 F (37.1 C)   Filed Weights   09/06/15 1844 09/07/15 0043  Weight: 281 lb (127.5 kg) 279 lb 11.2 oz (126.9 kg)    GENERAL: Well-nourished well-developed; Alert, no distress and comfortable.  Alone. EYES: no pallor or icterus OROPHARYNX: no thrush or  ulceration. NECK: supple, no masses felt LYMPH:  no palpable lymphadenopathy in the cervical, axillary or inguinal regions LUNGS: decreased breath sounds to auscultation at bases and  No wheeze or crackles HEART/CVS:  regular rate & rhythm and no murmurs; No lower extremity edema ABDOMEN: abdomen soft, non-tender and normal bowel sounds Musculoskeletal:no cyanosis of digits and no clubbing  PSYCH: alert & oriented x 3 with fluent speech NEURO: no focal motor/sensory deficits SKIN:  no rashes or significant lesions  LABORATORY DATA:  I have reviewed the data as listed Lab Results  Component Value Date   WBC 10.6 09/07/2015   HGB 12.4 09/07/2015   HCT 36.8 09/07/2015   MCV 89.1 09/07/2015   PLT 169 09/07/2015    Recent Labs  08/30/15 1241 09/03/15 0915 09/06/15 1846 09/07/15 0511 09/07/15 1600 09/08/15 0511  NA 135 135 135 139  --  138  K 3.0* 3.9 3.3* 2.8* 3.4* 4.1  CL 101 95* 94* 96*  --  96*  CO2 21* 32 28 32  --  30  GLUCOSE 125* 128* 195* 147*  --  144*  BUN 10 9 13 10   --  8  CREATININE 0.82 0.99 0.96 0.91  --  1.02*  CALCIUM 8.5* 8.6* 9.2 8.7*  --  8.9  GFRNONAA >60 >60 >60 >60  --  >60  GFRAA >60 >60 >60 >60  --  >60  PROT 7.9 8.0 8.7*  --   --   --   ALBUMIN 3.9 4.0 4.0  --   --   --   AST 27 24 33  --   --   --   ALT 22 17 23   --   --   --   ALKPHOS 69 76 88  --   --   --   BILITOT 0.6 0.6 0.8  --   --   --     RADIOGRAPHIC STUDIES: I have personally reviewed the radiological images as listed and agreed with the findings in the report. Ct Abdomen Pelvis Wo Contrast  Result Date: 09/06/2015 CLINICAL DATA:  Lower abdominal pain about the mid umbilicus for 3 days, worsening. History of metastatic uterine carcinoma. EXAM: CT ABDOMEN AND PELVIS WITHOUT CONTRAST TECHNIQUE: Multidetector CT imaging of the abdomen and pelvis was performed following the standard protocol without IV contrast. COMPARISON:  CT abdomen and pelvis 08/23/2015 and 07/23/2014. FINDINGS:  Innumerable pulmonary nodules are seen in the lower lobes bilaterally consistent with metastatic disease. Largest nodule in the right lower lobe measures 1.6 cm, unchanged. A nodule in the left lower lobe which had measured 1.0 cm in diameter today measures 1.5 cm on image 8. Several small new lesions are identified. No pleural or pericardial effusion. Innumerable hepatic metastases are identified as on the prior examinations. Index lesion in the right hepatic lobe measuring 3.1 cm on image 19 is unchanged since the most recent study. A second index lesion near the dome of the liver on image 9 measures 1.6 cm compared to 1.0 cm on the prior exam. The spleen, adrenal glands and pancreas appear normal. Since the prior study, the patient has developed moderate bilateral hydronephrosis, worse on the left. Cause for the obstruction is the patient's markedly enlarged uterus consistent with history of carcinoma. The uterus today measures 13.5 x 13.2 cm in the axial plane on image 72 compared to 11.5 x 12.7 cm on the prior examination. There is mass effect on the urinary bladder from the uterus. Calcified fibroids are noted. No lymphadenopathy is identified. Previously seen soft tissue density along the posterior aspect of the uterus on the left can no longer be separately identified. No evidence of bowel obstruction is seen. The rectosigmoid colon  is difficult to visualize due to the patient's uterine mass. No fluid collection is identified. No lytic or sclerotic bony lesion is seen. IMPRESSION: Progressive metastatic uterine carcinoma with increase in the size and number of pulmonary nodules and increase the size of the hepatic metastases. Moderate bilateral hydronephrosis due to obstruction from the patient's uterine mass is new since the CT 2 weeks ago. Electronically Signed   By: Inge Rise M.D.   On: 09/06/2015 21:28   Ct Abdomen Pelvis Wo Contrast  Result Date: 08/23/2015 CLINICAL DATA:  Upper and lower  abdominal pain. Recently diagnosed uterine cancer. EXAM: CT ABDOMEN AND PELVIS WITHOUT CONTRAST TECHNIQUE: Multidetector CT imaging of the abdomen and pelvis was performed following the standard protocol without IV contrast. COMPARISON:  07/23/2015 FINDINGS: Lower chest: Numerous nodules are again seen in the visualized lung bases and have increased in size, with the largest measuring 18 mm in the right lower lobe (series 4, image 18, previously 13 mm). There is no pleural effusion. Hepatobiliary: The numerous hypoattenuating liver lesions which have increased in size and number from the prior study, the largest measuring 3.1 cm in segment IV (series 2, image 18, previously 1.7 cm). Gallbladder is unremarkable. No biliary dilatation. Pancreas: Unremarkable. Spleen: Unremarkable. Adrenals/Urinary Tract: Unremarkable adrenal glands and right kidney. Slight fullness of the left renal collecting system. No significant ureteral dilatation. Bladder is decompressed. Stomach/Bowel: Oral contrast is present in the stomach and multiple loops of nondilated proximal to mid small bowel. There is no evidence of bowel obstruction. The appendix is unremarkable. Vascular/Lymphatic: Normal caliber of the abdominal aorta. Rounded soft tissue posteriorly in the left aspect of the pelvis measures 3.2 x 3.1 cm (series 2, image 73, previously 2.3 x 3.0 cm). Reproductive: Suspected calcified fibroid in the posterior superior uterine segment is unchanged. Masslike enlargement of the uterus more inferiorly is grossly similar to the prior study, with assessment partially limited by streak artifact from bilateral hip arthroplasties. Other: No free fluid. Musculoskeletal: Bilateral hip arthroplasties. No suspicious lytic or blastic osseous lesion identified. IMPRESSION: 1. Interval progression of lung and liver metastases. 2. Enlargement of left pelvic nodal metastasis/ peritoneal implant. 3. Grossly similar appearance of infiltrative lower  uterine mass. Electronically Signed   By: Logan Bores M.D.   On: 08/23/2015 20:40   Dg Chest 2 View  Result Date: 08/23/2015 CLINICAL DATA:  Pain under right breast and in right arm with shortness of breath beginning this morning. EXAM: CHEST  2 VIEW COMPARISON:  Chest x-ray dated 12/07/2011. FINDINGS: Heart size is normal. Atherosclerotic changes noted at the aortic arch. Ill-defined nodularity is seen within the right lower lung, largest within the lateral aspects of the right lower lung. Left lung is relatively clear. No pleural effusion or pneumothorax seen. Osseous structures about the chest are unremarkable. IMPRESSION: Probable small pulmonary nodules within the right lower lung, perhaps with surrounding interstitial edema. Recommend chest CT for further characterization. Aortic atherosclerosis. Electronically Signed   By: Franki Cabot M.D.   On: 08/23/2015 18:56   Ct Head Wo Contrast  Result Date: 08/30/2015 CLINICAL DATA:  Periods of confusion for the past 2 days. History of metastatic cancer. EXAM: CT HEAD WITHOUT CONTRAST TECHNIQUE: Contiguous axial images were obtained from the base of the skull through the vertex without intravenous contrast. COMPARISON:  02/28/2013; 11/10/2006 FINDINGS: Brain: Gray-white differentiation is maintained. No CT evidence of acute large territory infarct. No intraparenchymal extra-axial mass or hemorrhage. Normal size and configuration the ventricles and basilar cisterns. No  midline shift. Vascular: No hyperdense vessel or unexpected calcification. Skull: No displaced calvarial fracture. Note is made of mild hyperostosis frontalis. Sinuses/Orbits: There is underpneumatization of the right frontal sinus. The remaining paranasal sinuses and mastoid air cells are normally aerated. Air-fluid levels. Other: Regional soft tissues appear normal. IMPRESSION: Negative noncontrast head CT. Electronically Signed   By: Sandi Mariscal M.D.   On: 08/30/2015 22:14  Ct Chest Wo  Contrast  Result Date: 08/25/2015 CLINICAL DATA:  Pulmonary nodules by chest x-ray 08/23/2015. History of metastatic uterine cancer. EXAM: CT CHEST WITHOUT CONTRAST TECHNIQUE: Multidetector CT imaging of the chest was performed following the standard protocol without IV contrast. COMPARISON:  08/23/2015, 07/23/2015, 01/30/2008 FINDINGS: Mediastinum/Lymph Nodes: Atherosclerosis of the thoracic aorta noted. No definite mediastinal or hilar adenopathy within the limits of noncontrast imaging. Normal heart size. No pericardial effusion. Lungs/Pleura: Numerous bilateral new pulmonary nodules throughout all lobes of both lungs. These are new compared to 01/30/2008 chest CT. Nodules are compatible with diffuse pulmonary metastases. Index peripheral right lower lobe pulmonary nodule measures 16 mm, image 80. Index left upper lobe pulmonary nodule measures 10 mm, image 33. No superimposed edema, collapse or consolidation. No focal pneumonia or airspace process. No pleural abnormality, effusion, or pneumothorax. Trachea and central airways remain patent. Upper abdomen: Numerous hypodense lesions throughout the imaged portion of the liver compatible with extensive hepatic metastases. No biliary dilatation. Gallbladder is collapsed. No upper abdominal adenopathy. Musculoskeletal: Minor thoracic degenerative change. No acute compression fracture. Thoracic spine and sternum appear intact. IMPRESSION: Innumerable bilateral pulmonary metastases involving all lobes of both lungs. Evidence of extensive diffuse hepatic metastases as well without biliary obstruction or dilatation. Thoracic aortic atherosclerosis Electronically Signed   By: Jerilynn Mages.  Shick M.D.   On: 08/25/2015 14:42   Nm Pulmonary Perf And Vent  Result Date: 08/23/2015 CLINICAL DATA:  Pain. EXAM: NUCLEAR MEDICINE VENTILATION - PERFUSION LUNG SCAN TECHNIQUE: Ventilation images were obtained in multiple projections using inhaled aerosol Tc-3m DTPA. Perfusion images were  obtained in multiple projections after intravenous injection of Tc-21m MAA. RADIOPHARMACEUTICALS:  32.1 mCi Technetium-14m DTPA aerosol inhalation and 3.88 mCi Technetium-74m MAA IV COMPARISON:  Chest x-ray from earlier today FINDINGS: Ventilation: No focal ventilation defect. Perfusion: No wedge shaped peripheral perfusion defects to suggest acute pulmonary embolism. IMPRESSION: No evidence of pulmonary emboli.  Very low probability V/Q scan. Electronically Signed   By: Dorise Bullion III M.D   On: 08/23/2015 23:55   Dg Abd Acute W/chest  Result Date: 08/30/2015 CLINICAL DATA:  Low abdominal pain with weakness and confusion for 2 days. Metastatic uterine cancer with known bilateral pulmonary nodules. EXAM: DG ABDOMEN ACUTE W/ 1V CHEST COMPARISON:  Chest CT 08/25/2015.  Radiographs 08/23/2015. FINDINGS: The heart size and mediastinal contours are stable without definite adenopathy. There is aortic atherosclerosis. Innumerable pulmonary nodules are present in both lungs. There is no airspace disease, pleural effusion or pneumothorax. Postsurgical changes are noted status post lower cervical fusion. The bowel gas pattern is nonobstructive. There is no free intraperitoneal air or suspicious abdominal calcification. Patient is status post bilateral total hip arthroplasties. There are no acute or suspicious osseous findings. IMPRESSION: 1. No acute findings or explanation for the patient's symptoms. 2. Widespread pulmonary metastatic disease as demonstrated on recent prior studies. Electronically Signed   By: Richardean Sale M.D.   On: 08/30/2015 19:16   ASSESSMENT & PLAN:   # Metastatic uterine PECOMA- with multiple metastatic lesions to the liver/ lung. Status post cycle #1 of Torisel last  week.  Long discussion again with the patient regarding- palliative nature of the treatments/and her disease is not curable. Since she has a tumor- with significant difficulty to predict her response rates/longevity. For  now I would recommend continued Torisel.   # Appreciated palliative care evaluation. Patient has difficulty coping up with her diagnosis/some concerns for lack of enough family support. Involvement of palliative care in the patient's care- would be helpful in controlling her anxiety/ depression etc. I spoke to palliative care team about the patient's plan of care prognosis.  # For now continue current pain control.  Thank you Dr. Darvin Neighbours for allowing me to participate in the care of your pleasant patient. Please do not hesitate to contact me with questions or concerns in the interim.  All questions were answered. The patient knows to call the clinic with any problems, questions or concerns.  # I reviewed the blood work- with the patient in detail; also reviewed the imaging independently [as summarized above].     Cammie Sickle, MD 09/08/2015 7:50 AM

## 2015-09-08 NOTE — Progress Notes (Signed)
Notified Dr. Darvin Neighbours via telephone that following bolus patients heart rate remained under 120 while ambulating, highest heart rate reached was 117 and returned to below 100 at rest, per MD patient may be discharged.

## 2015-09-08 NOTE — Progress Notes (Signed)
Notified Dr. Darvin Neighbours that patient's heart rate elevated with ambulation 140-150's and return below 100 bpm when at rest. MD will enter order for 500 cc fluid bolus, patient denies chest pain. Verbal in person.

## 2015-09-08 NOTE — Progress Notes (Signed)
Silent prayer given for patient. Patient not available.

## 2015-09-10 ENCOUNTER — Inpatient Hospital Stay: Payer: Medicare Other | Attending: Internal Medicine

## 2015-09-10 ENCOUNTER — Inpatient Hospital Stay: Payer: Medicare Other

## 2015-09-10 ENCOUNTER — Other Ambulatory Visit: Payer: Self-pay | Admitting: Internal Medicine

## 2015-09-10 VITALS — BP 119/75 | HR 87 | Temp 98.8°F | Resp 20

## 2015-09-10 DIAGNOSIS — R300 Dysuria: Secondary | ICD-10-CM | POA: Insufficient documentation

## 2015-09-10 DIAGNOSIS — Z7982 Long term (current) use of aspirin: Secondary | ICD-10-CM | POA: Diagnosis not present

## 2015-09-10 DIAGNOSIS — X19XXXA Contact with other heat and hot substances, initial encounter: Secondary | ICD-10-CM | POA: Insufficient documentation

## 2015-09-10 DIAGNOSIS — C548 Malignant neoplasm of overlapping sites of corpus uteri: Secondary | ICD-10-CM | POA: Insufficient documentation

## 2015-09-10 DIAGNOSIS — G629 Polyneuropathy, unspecified: Secondary | ICD-10-CM | POA: Diagnosis not present

## 2015-09-10 DIAGNOSIS — N2889 Other specified disorders of kidney and ureter: Secondary | ICD-10-CM | POA: Insufficient documentation

## 2015-09-10 DIAGNOSIS — C7800 Secondary malignant neoplasm of unspecified lung: Secondary | ICD-10-CM | POA: Insufficient documentation

## 2015-09-10 DIAGNOSIS — Z5111 Encounter for antineoplastic chemotherapy: Secondary | ICD-10-CM | POA: Diagnosis not present

## 2015-09-10 DIAGNOSIS — M5136 Other intervertebral disc degeneration, lumbar region: Secondary | ICD-10-CM | POA: Diagnosis not present

## 2015-09-10 DIAGNOSIS — Z87891 Personal history of nicotine dependence: Secondary | ICD-10-CM | POA: Insufficient documentation

## 2015-09-10 DIAGNOSIS — E669 Obesity, unspecified: Secondary | ICD-10-CM | POA: Insufficient documentation

## 2015-09-10 DIAGNOSIS — E119 Type 2 diabetes mellitus without complications: Secondary | ICD-10-CM | POA: Insufficient documentation

## 2015-09-10 DIAGNOSIS — N1330 Unspecified hydronephrosis: Secondary | ICD-10-CM | POA: Insufficient documentation

## 2015-09-10 DIAGNOSIS — R339 Retention of urine, unspecified: Secondary | ICD-10-CM | POA: Insufficient documentation

## 2015-09-10 DIAGNOSIS — G4700 Insomnia, unspecified: Secondary | ICD-10-CM | POA: Diagnosis not present

## 2015-09-10 DIAGNOSIS — F329 Major depressive disorder, single episode, unspecified: Secondary | ICD-10-CM | POA: Diagnosis not present

## 2015-09-10 DIAGNOSIS — I10 Essential (primary) hypertension: Secondary | ICD-10-CM | POA: Insufficient documentation

## 2015-09-10 DIAGNOSIS — G893 Neoplasm related pain (acute) (chronic): Secondary | ICD-10-CM | POA: Diagnosis not present

## 2015-09-10 DIAGNOSIS — E785 Hyperlipidemia, unspecified: Secondary | ICD-10-CM | POA: Diagnosis not present

## 2015-09-10 DIAGNOSIS — K5900 Constipation, unspecified: Secondary | ICD-10-CM | POA: Diagnosis not present

## 2015-09-10 DIAGNOSIS — Z79899 Other long term (current) drug therapy: Secondary | ICD-10-CM | POA: Insufficient documentation

## 2015-09-10 DIAGNOSIS — R1110 Vomiting, unspecified: Secondary | ICD-10-CM | POA: Insufficient documentation

## 2015-09-10 DIAGNOSIS — C787 Secondary malignant neoplasm of liver and intrahepatic bile duct: Secondary | ICD-10-CM | POA: Diagnosis not present

## 2015-09-10 LAB — CBC WITH DIFFERENTIAL/PLATELET
BASOS ABS: 0.1 10*3/uL (ref 0–0.1)
BASOS PCT: 1 %
Eosinophils Absolute: 0.1 10*3/uL (ref 0–0.7)
Eosinophils Relative: 1 %
HEMATOCRIT: 35.5 % (ref 35.0–47.0)
HEMOGLOBIN: 11.8 g/dL — AB (ref 12.0–16.0)
LYMPHS PCT: 18 %
Lymphs Abs: 2 10*3/uL (ref 1.0–3.6)
MCH: 29.6 pg (ref 26.0–34.0)
MCHC: 33.4 g/dL (ref 32.0–36.0)
MCV: 88.9 fL (ref 80.0–100.0)
MONO ABS: 1.4 10*3/uL — AB (ref 0.2–0.9)
MONOS PCT: 12 %
NEUTROS ABS: 7.9 10*3/uL — AB (ref 1.4–6.5)
NEUTROS PCT: 68 %
Platelets: 141 10*3/uL — ABNORMAL LOW (ref 150–440)
RBC: 3.99 MIL/uL (ref 3.80–5.20)
RDW: 12.9 % (ref 11.5–14.5)
WBC: 11.4 10*3/uL — ABNORMAL HIGH (ref 3.6–11.0)

## 2015-09-10 LAB — BASIC METABOLIC PANEL
ANION GAP: 10 (ref 5–15)
BUN: 12 mg/dL (ref 6–20)
CHLORIDE: 94 mmol/L — AB (ref 101–111)
CO2: 27 mmol/L (ref 22–32)
Calcium: 8.5 mg/dL — ABNORMAL LOW (ref 8.9–10.3)
Creatinine, Ser: 0.97 mg/dL (ref 0.44–1.00)
GFR calc non Af Amer: >60 mL/min (ref >60)
Glucose, Bld: 166 mg/dL — ABNORMAL HIGH (ref 65–99)
Potassium: 3.4 mmol/L — ABNORMAL LOW (ref 3.5–5.1)
Sodium: 131 mmol/L — ABNORMAL LOW (ref 135–145)

## 2015-09-10 MED ORDER — SODIUM CHLORIDE 0.9 % IV SOLN
Freq: Once | INTRAVENOUS | Status: AC
  Administered 2015-09-10: 11:00:00 via INTRAVENOUS
  Filled 2015-09-10: qty 1000

## 2015-09-10 MED ORDER — PROCHLORPERAZINE MALEATE 10 MG PO TABS
10.0000 mg | ORAL_TABLET | Freq: Once | ORAL | Status: AC
  Administered 2015-09-10: 10 mg via ORAL
  Filled 2015-09-10: qty 1

## 2015-09-10 MED ORDER — DIPHENHYDRAMINE HCL 50 MG/ML IJ SOLN
12.5000 mg | Freq: Once | INTRAMUSCULAR | Status: AC
  Administered 2015-09-10: 12.5 mg via INTRAVENOUS
  Filled 2015-09-10: qty 1

## 2015-09-10 MED ORDER — TEMSIROLIMUS CHEMO INJECTION 25 MG/ML W/DILUENT
25.0000 mg | Freq: Once | INTRAVENOUS | Status: AC
  Administered 2015-09-10: 25 mg via INTRAVENOUS
  Filled 2015-09-10: qty 2.5

## 2015-09-10 NOTE — Progress Notes (Signed)
Patient is extremely lethargic.  Hard to arouse.  Will open eyes and speak for a few seconds and go back to sleep.  Family states patient took nucynta to help with pain.  Dr. Rogue Bussing aware.  Reduced Benadryl dose from 50 mg to 12.5.

## 2015-09-10 NOTE — Discharge Summary (Signed)
[facility] [place] NAME: Annette [name]    MR#:  [id number]  DATE OF BIRTH:  04/09/1960  DATE OF ADMISSION:  09/06/2015 ADMITTING PHYSICIAN: Lance Coon, MD  DATE OF DISCHARGE: 09/08/2015  5:53 PM  PRIMARY CARE PHYSICIAN: Ellamae Sia, MD   ADMISSION DIAGNOSIS:  Generalized abdominal pain [R10.84] Intractable pain [R52] Abdominal pain, unspecified abdominal location [R10.9] Malignant neoplasm of uterus, unspecified site [facility]) [C55]  DISCHARGE DIAGNOSIS:  Principal Problem:   Intractable pain Active Problems:   Diabetes mellitus ([place])   Malignant neoplasm of overlapping sites of body of uterus ([place])   HTN (hypertension)   HLD (hyperlipidemia)   Bilateral hydronephrosis   Generalized abdominal pain   Palliative care encounter   Chronic pain   SECONDARY DIAGNOSIS:   Past Medical History:  Diagnosis Date  . Asthma   . Back pain   . DDD (degenerative disc disease), lumbar   . Depression   . Diabetes mellitus without complication ([place])   . Hyperlipidemia   . Hypertension   . Insomnia   . Menopausal syndrome   . Obesity   . Onychomycosis   . Peripheral neuropathy ([place])   . Renal insufficiency      ADMITTING HISTORY  Annette [name]  is a 55 y.o. female who presents with Intractable abdominal pain. Patient was recently admitted here to the hospital for the same thing within the last couple weeks and states that she's had a difficult time managing her pain since then. Tonight became significantly worse again. Lab workup here is initially within normal limits, but imaging shows new bilateral hydronephrosis due to her uterine mass. Multiple rounds of pain meds in the ED did not control her pain. Patient also states she's not had a bowel movement for the past 6 days. Hospitals were called for admission.  HOSPITAL COURSE:   Chronic pain, abdomen and back - worsening progressively She has been on Nucynta and Oxycodone per  Dr. Primus Bravo. Replaced Nucynta with Oxycontin as it is not available in hospital. Dilaudid IV PRN used. ON day of discharge her dilaudid was discontinued. Oxycodone increased from 10 to 20 mg with which her pain is better controlled. She was seen by oncology and palliative care in the hospital. Follow-up at the pain Center and oncology as outpatient.  Diabetes mellitus ([place]) - sliding scale insulin with corresponding glucose checks and carb modified diet  Malignant neoplasm of overlapping sites of body of uterus ([place]) - following with oncology  HTN (hypertension) - continue home meds  Bilateral hydronephrosis - new, due to traction from uterine mass, creatinine currently stable, oncology consult as above Discussed with urology. Patient has good urine output and BUN/Cr normal. Needs OP follow up with urology.  HLD (hyperlipidemia) - continue home meds  Stable for discharge home.  CONSULTS OBTAINED:  Treatment Team:  Bjorn Loser, MD Cammie Sickle, MD  DRUG ALLERGIES:   Allergies  Allergen Reactions  . Iodides Swelling  . Iodine Swelling  . Shellfish Allergy Swelling    shrimp causes swelling  . Shellfish-Derived Products Swelling  . Iodinated Diagnostic Agents Swelling  . Povidone Iodine Swelling    eyes  . Povidone-Iodine Swelling    DISCHARGE MEDICATIONS:   Discharge Medication List as of 09/08/2015  4:53 PM    CONTINUE these medications which have CHANGED   Details  Oxycodone HCl 10 MG TABS Take 2 tablets (20 mg total) by mouth every 4 (four) hours as needed. Limit  1 tablet by mouth every 4 hours as needed for breakthrough pain while taking Nucynta if tolerated, Starting Wed 09/08/2015, Print      CONTINUE these medications which have NOT CHANGED   Details  albuterol (PROAIR HFA) 108 (90 Base) MCG/ACT inhaler Inhale 1-2 puffs into the lungs every 6 (six) hours as needed for wheezing. , Historical Med    alprazolam (XANAX) 2 MG tablet Take 2  mg by mouth 2 (two) times daily as needed for anxiety. , Historical Med    aspirin EC 81 MG tablet Take 81 mg by mouth., Historical Med    beclomethasone (QVAR) 40 MCG/ACT inhaler Inhale 2 puffs into the lungs 2 (two) times daily. Reported on 05/26/2015, Historical Med    buPROPion (WELLBUTRIN SR) 150 MG 12 hr tablet Take 150 mg by mouth daily., Historical Med    docusate sodium (COLACE) 100 MG capsule Take 100 mg by mouth 2 (two) times daily., Historical Med    gabapentin (NEURONTIN) 600 MG tablet Limit 1 tablet by mouth twice per day if tolerated, Normal    hydrochlorothiazide (HYDRODIURIL) 25 MG tablet Take 25 mg by mouth daily. , Historical Med    ibuprofen (ADVIL,MOTRIN) 800 MG tablet Take 800 mg by mouth every 8 (eight) hours as needed for mild pain or moderate pain. , Historical Med    lactulose (CHRONULAC) 10 GM/15ML solution Take 30 g by mouth 2 (two) times daily as needed for mild constipation or moderate constipation. , Historical Med    lisinopril (PRINIVIL,ZESTRIL) 10 MG tablet Take 10 mg by mouth daily. , Historical Med    metFORMIN (GLUCOPHAGE-XR) 500 MG 24 hr tablet Take 500 mg by mouth daily with breakfast., Historical Med    ondansetron (ZOFRAN) 4 MG tablet Take 4 mg by mouth every 8 (eight) hours as needed for nausea or vomiting. , Historical Med    pravastatin (PRAVACHOL) 40 MG tablet Take 40 mg by mouth daily. , Historical Med    senna-docusate (SENOKOT-S) 8.6-50 MG tablet Take 2 tablets by mouth daily., Starting Sat 08/28/2015, Normal    silver sulfADIAZINE (SILVADENE) 1 % cream Apply topically daily., Starting Sat 08/28/2015, Normal    Tapentadol HCl (NUCYNTA ER) 150 MG TB12 Limit 1 tab by mouth  every 8 -12 hours IF tolerated   (Note pill is now 150 mg and much stronger), Print    tiZANidine (ZANAFLEX) 2 MG tablet Limit 1 tablet by mouth 2-4 times per day if tolerated, Normal    traZODone (DESYREL) 150 MG tablet Take 300 mg by mouth at bedtime as needed for  sleep. , Historical Med    !! Vilazodone HCl (VIIBRYD) 20 MG TABS Take 20 mg by mouth daily. Takes with 40mg  for total of 60mg  daily, Historical Med    !! Vilazodone HCl (VIIBRYD) 40 MG TABS Take 40 mg by mouth daily. Takes with 20mg  for total of 60mg  daily, Historical Med     !! - Potential duplicate medications found. Please discuss with provider.      Today   VITAL SIGNS:  Blood pressure (!) 142/55, pulse 98, temperature 98.2 F (36.8 C), temperature source Oral, resp. rate 20, height 5\' 5"  (1.651 m), weight 126.9 kg (279 lb 11.2 oz), SpO2 95 %.  I/O:  No intake or output data in the 24 hours ending 09/10/15 1027  PHYSICAL EXAMINATION:  Physical Exam  GENERAL:  55 y.o.-year-old patient lying in the bed with no acute distress.  LUNGS: Normal breath sounds bilaterally, no wheezing, rales,rhonchi  or crepitation. No use of accessory muscles of respiration.  CARDIOVASCULAR: S1, S2 normal. No murmurs, rubs, or gallops.  ABDOMEN: Soft, non-tender, non-distended. Bowel sounds present. No organomegaly or mass.  NEUROLOGIC: Moves all 4 extremities. PSYCHIATRIC: The patient is alert and oriented x 3.  SKIN: No obvious rash, lesion, or ulcer.   DATA REVIEW:   CBC  Recent Labs Lab 09/07/15 0511  WBC 10.6  HGB 12.4  HCT 36.8  PLT 169    Chemistries   Recent Labs Lab 09/06/15 1846 09/07/15 0511  09/08/15 0511  NA 135 139  --  138  K 3.3* 2.8*  < > 4.1  CL 94* 96*  --  96*  CO2 28 32  --  30  GLUCOSE 195* 147*  --  144*  BUN 13 10  --  8  CREATININE 0.96 0.91  --  1.02*  CALCIUM 9.2 8.7*  --  8.9  MG  --  2.1  --   --   AST 33  --   --   --   ALT 23  --   --   --   ALKPHOS 88  --   --   --   BILITOT 0.8  --   --   --   < > = values in this interval not displayed.  Cardiac Enzymes No results for input(s): TROPONINI in the last 168 hours.  Microbiology Results  Results for orders placed or performed during the hospital encounter of 08/30/15  Culture, blood  (routine x 2)     Status: None   Collection Time: 08/30/15  4:29 PM  Result Value Ref Range Status   Specimen Description BLOOD RAC  Final   Special Requests BOTTLES DRAWN AEROBIC AND ANAEROBIC 6CC  Final   Culture NO GROWTH 5 DAYS  Final   Report Status 09/04/2015 FINAL  Final  Culture, blood (routine x 2)     Status: None   Collection Time: 08/30/15  4:29 PM  Result Value Ref Range Status   Specimen Description BLOOD LAC  Final   Special Requests BOTTLES DRAWN AEROBIC AND ANAEROBIC 10CC  Final   Culture NO GROWTH 5 DAYS  Final   Report Status 09/04/2015 FINAL  Final  Urine culture     Status: Abnormal   Collection Time: 08/30/15  5:00 PM  Result Value Ref Range Status   Specimen Description URINE, RANDOM  Final   Special Requests NONE  Final   Culture MULTIPLE SPECIES PRESENT, SUGGEST RECOLLECTION (A)  Final   Report Status 09/01/2015 FINAL  Final    RADIOLOGY:  No results found.  Follow up with PCP in 1 week.  Management plans discussed with the patient, family and they are in agreement.  CODE STATUS:  Code Status History    Date Active Date Inactive Code Status Order ID Comments User Context   09/07/2015 12:37 AM 09/08/2015  8:59 PM Full Code [id number]  Lance Coon, MD Inpatient   08/24/2015  2:10 AM 08/28/2015 12:17 PM Full Code [id number]  Lance Coon, MD Inpatient      TOTAL TIME TAKING CARE OF THIS PATIENT ON DAY OF DISCHARGE: more than 30 minutes.   Hillary Bow R M.D on 09/10/2015 at 10:27 AM  Between 7am to 6pm - Pager - [phone]  After 6pm go to [email or web address] - password [email or web address] [facility]  Office  [phone]  CC: Primary care physician; Ellamae Sia, MD  Note: This dictation was prepared with  Dragon dictation along with smaller Company secretary. Any transcriptional errors that result from this process are unintentional.

## 2015-09-13 ENCOUNTER — Telehealth: Payer: Self-pay | Admitting: *Deleted

## 2015-09-13 NOTE — Telephone Encounter (Signed)
Received incoming fax from [facility] requesting order to be changed to [facility] "which is more appropriate for most hematology malignancies/sacrcomas." "test is more expensive $7,200 and there is a longer turn around time by 15-18 days.  order signed by Dr. Rogue Bussing and faxed directly back to [facility].  I inquired about the patient's personal expense of this test. According to [facility], prior auths are not performed on any [facility] testing. Once processed, the claim is submitted directly to the patient's insurance.  There is not patient support available to discuss this information to patient prior to submitting the request for this service.  The [facility] department stated that It was the doctor's responsibility to review that with the patient that [facility] will be submitted & provide pt education regarding [facility] purpose and roll in treatment plan. Patient could always be encouraged to contact [facility], but again, she receptionist stressed that the exact expense of the test can not be determined until the entire testing process is performed.

## 2015-09-15 ENCOUNTER — Other Ambulatory Visit: Payer: Self-pay | Admitting: Internal Medicine

## 2015-09-16 ENCOUNTER — Other Ambulatory Visit: Payer: Self-pay

## 2015-09-16 DIAGNOSIS — N1330 Unspecified hydronephrosis: Secondary | ICD-10-CM

## 2015-09-16 DIAGNOSIS — C548 Malignant neoplasm of overlapping sites of corpus uteri: Secondary | ICD-10-CM

## 2015-09-16 NOTE — Progress Notes (Signed)
  Oncology Nurse Navigator Documentation  Navigator Location: [facility] (09/16/15 1400)                 Barriers/Navigation Needs: Coordination of Care (09/16/15 1400)   Interventions: Coordination of Care;Referrals (09/16/15 1400)   Coordination of Care: Appts (09/16/15 1400)                  Time Spent with Patient: 30 (09/16/15 1400)   Spoke with Lattie Haw at [facility]. Consultation for Bilateral hydronephrosis arranged for 09/21/15 at 1530 with Dr Erlene Quan. Patient to be notified at appt with Dr Rogue Bussing 8/11

## 2015-09-17 ENCOUNTER — Inpatient Hospital Stay (HOSPITAL_BASED_OUTPATIENT_CLINIC_OR_DEPARTMENT_OTHER): Payer: Medicare Other | Admitting: Internal Medicine

## 2015-09-17 ENCOUNTER — Inpatient Hospital Stay: Payer: Medicare Other

## 2015-09-17 VITALS — BP 127/88 | HR 91 | Temp 98.1°F | Resp 18 | Wt 278.4 lb

## 2015-09-17 DIAGNOSIS — C548 Malignant neoplasm of overlapping sites of corpus uteri: Secondary | ICD-10-CM

## 2015-09-17 DIAGNOSIS — R339 Retention of urine, unspecified: Secondary | ICD-10-CM

## 2015-09-17 DIAGNOSIS — C540 Malignant neoplasm of isthmus uteri: Secondary | ICD-10-CM

## 2015-09-17 DIAGNOSIS — C7800 Secondary malignant neoplasm of unspecified lung: Secondary | ICD-10-CM | POA: Diagnosis not present

## 2015-09-17 DIAGNOSIS — E669 Obesity, unspecified: Secondary | ICD-10-CM

## 2015-09-17 DIAGNOSIS — G893 Neoplasm related pain (acute) (chronic): Secondary | ICD-10-CM | POA: Diagnosis not present

## 2015-09-17 DIAGNOSIS — Z7982 Long term (current) use of aspirin: Secondary | ICD-10-CM

## 2015-09-17 DIAGNOSIS — G629 Polyneuropathy, unspecified: Secondary | ICD-10-CM

## 2015-09-17 DIAGNOSIS — K5900 Constipation, unspecified: Secondary | ICD-10-CM

## 2015-09-17 DIAGNOSIS — R300 Dysuria: Secondary | ICD-10-CM

## 2015-09-17 DIAGNOSIS — C787 Secondary malignant neoplasm of liver and intrahepatic bile duct: Secondary | ICD-10-CM | POA: Diagnosis not present

## 2015-09-17 DIAGNOSIS — M5136 Other intervertebral disc degeneration, lumbar region: Secondary | ICD-10-CM

## 2015-09-17 DIAGNOSIS — N1330 Unspecified hydronephrosis: Secondary | ICD-10-CM

## 2015-09-17 DIAGNOSIS — N2889 Other specified disorders of kidney and ureter: Secondary | ICD-10-CM

## 2015-09-17 DIAGNOSIS — I10 Essential (primary) hypertension: Secondary | ICD-10-CM

## 2015-09-17 DIAGNOSIS — E119 Type 2 diabetes mellitus without complications: Secondary | ICD-10-CM

## 2015-09-17 DIAGNOSIS — X19XXXA Contact with other heat and hot substances, initial encounter: Secondary | ICD-10-CM

## 2015-09-17 DIAGNOSIS — R1110 Vomiting, unspecified: Secondary | ICD-10-CM

## 2015-09-17 DIAGNOSIS — F329 Major depressive disorder, single episode, unspecified: Secondary | ICD-10-CM

## 2015-09-17 DIAGNOSIS — Z79899 Other long term (current) drug therapy: Secondary | ICD-10-CM

## 2015-09-17 DIAGNOSIS — G4700 Insomnia, unspecified: Secondary | ICD-10-CM

## 2015-09-17 DIAGNOSIS — Z87891 Personal history of nicotine dependence: Secondary | ICD-10-CM

## 2015-09-17 DIAGNOSIS — E785 Hyperlipidemia, unspecified: Secondary | ICD-10-CM

## 2015-09-17 LAB — COMPREHENSIVE METABOLIC PANEL
ALBUMIN: 3.4 g/dL — AB (ref 3.5–5.0)
ALK PHOS: 100 U/L (ref 38–126)
ALT: 20 U/L (ref 14–54)
ANION GAP: 12 (ref 5–15)
AST: 30 U/L (ref 15–41)
BUN: 12 mg/dL (ref 6–20)
CALCIUM: 8.5 mg/dL — AB (ref 8.9–10.3)
CHLORIDE: 90 mmol/L — AB (ref 101–111)
CO2: 30 mmol/L (ref 22–32)
Creatinine, Ser: 1.09 mg/dL — ABNORMAL HIGH (ref 0.44–1.00)
GFR calc Af Amer: >60 mL/min (ref >60)
GFR calc non Af Amer: 56 mL/min — ABNORMAL LOW (ref >60)
GLUCOSE: 235 mg/dL — AB (ref 65–99)
Potassium: 3.5 mmol/L (ref 3.5–5.1)
SODIUM: 132 mmol/L — AB (ref 135–145)
Total Bilirubin: 0.3 mg/dL (ref 0.3–1.2)
Total Protein: 8.6 g/dL — ABNORMAL HIGH (ref 6.5–8.1)

## 2015-09-17 LAB — CBC WITH DIFFERENTIAL/PLATELET
BASOS PCT: 1 %
Basophils Absolute: 0.1 10*3/uL (ref 0–0.1)
EOS ABS: 0.1 10*3/uL (ref 0–0.7)
Eosinophils Relative: 1 %
HCT: 34.5 % — ABNORMAL LOW (ref 35.0–47.0)
HEMOGLOBIN: 11.4 g/dL — AB (ref 12.0–16.0)
Lymphocytes Relative: 26 %
Lymphs Abs: 2.8 10*3/uL (ref 1.0–3.6)
MCH: 29 pg (ref 26.0–34.0)
MCHC: 33 g/dL (ref 32.0–36.0)
MCV: 87.9 fL (ref 80.0–100.0)
Monocytes Absolute: 1 10*3/uL — ABNORMAL HIGH (ref 0.2–0.9)
Monocytes Relative: 9 %
NEUTROS PCT: 63 %
Neutro Abs: 6.9 10*3/uL — ABNORMAL HIGH (ref 1.4–6.5)
PLATELETS: 174 10*3/uL (ref 150–440)
RBC: 3.92 MIL/uL (ref 3.80–5.20)
RDW: 13.3 % (ref 11.5–14.5)
WBC: 10.9 10*3/uL (ref 3.6–11.0)

## 2015-09-17 LAB — URINALYSIS COMPLETE WITH MICROSCOPIC (ARMC ONLY)
Bacteria, UA: NONE SEEN
Bilirubin Urine: NEGATIVE
Glucose, UA: NEGATIVE mg/dL
KETONES UR: NEGATIVE mg/dL
Leukocytes, UA: NEGATIVE
Nitrite: NEGATIVE
PROTEIN: 100 mg/dL — AB
Specific Gravity, Urine: 1.009 (ref 1.005–1.030)
pH: 6 (ref 5.0–8.0)

## 2015-09-17 MED ORDER — TEMSIROLIMUS CHEMO INJECTION 25 MG/ML W/DILUENT
25.0000 mg | Freq: Once | INTRAVENOUS | Status: AC
  Administered 2015-09-17: 25 mg via INTRAVENOUS
  Filled 2015-09-17: qty 2.5

## 2015-09-17 MED ORDER — SODIUM CHLORIDE 0.9 % IV SOLN
Freq: Once | INTRAVENOUS | Status: AC
  Administered 2015-09-17: 11:00:00 via INTRAVENOUS
  Filled 2015-09-17: qty 1000

## 2015-09-17 MED ORDER — DIPHENHYDRAMINE HCL 50 MG/ML IJ SOLN
12.5000 mg | Freq: Once | INTRAMUSCULAR | Status: AC
  Administered 2015-09-17: 12.5 mg via INTRAVENOUS
  Filled 2015-09-17: qty 1

## 2015-09-17 MED ORDER — PROCHLORPERAZINE MALEATE 10 MG PO TABS
10.0000 mg | ORAL_TABLET | Freq: Once | ORAL | Status: AC
  Administered 2015-09-17: 10 mg via ORAL
  Filled 2015-09-17: qty 1

## 2015-09-17 NOTE — Progress Notes (Signed)
Patient states she is constipated.  She has had 1 BM this week.  Talked to her about her diet, increasing her fluid intake and using her lactalose.  States she is up a lot at night to urinate.  Patient has an area on her right thigh that she states she burned by lying on a heating pad. 1 X 2.5 inches.  Asking for dressing change.  Would like MD to assess.    Cleaned area around wound with chloraprep .  Applied clean, dry dressing.  Patient tolerated well.

## 2015-09-17 NOTE — Assessment & Plan Note (Addendum)
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 Stage IV- reviewed with the patient and family at length.  She understands  That the treatments or palliative. Also discussed with Dr.Berchuck.   #  I recommend the use of Torisel  Weekly- s/p 2 treatments; proceed with # 3 today. Tolerating well. Labs are okay. No major side effects noted. We will plan to get a CT scan after 4-6 treatments.  # Dysuria- recommend UA.   # Chronic pain- currently managed by Dr.Crisp-  Continue pain management at this time.  # Hydronephrosis on the CT scan recommend- evaluation with Dr. Erlene Quan.  # skin ulceration- sec to heating pad- improving. Nepsorin/ silavdene  #  Patient will follow-up with me with labs treatment in 2w;  Follow-up next week for lab/ treatment.

## 2015-09-18 LAB — URINE CULTURE

## 2015-09-19 ENCOUNTER — Inpatient Hospital Stay
Admission: EM | Admit: 2015-09-19 | Discharge: 2015-09-21 | DRG: 948 | Disposition: A | Discharge status: Home / Self Care | Payer: Medicare Other | Attending: Internal Medicine | Admitting: Internal Medicine

## 2015-09-19 ENCOUNTER — Emergency Department: Payer: Medicare Other

## 2015-09-19 DIAGNOSIS — Z87891 Personal history of nicotine dependence: Secondary | ICD-10-CM

## 2015-09-19 DIAGNOSIS — I10 Essential (primary) hypertension: Secondary | ICD-10-CM

## 2015-09-19 DIAGNOSIS — G893 Neoplasm related pain (acute) (chronic): Principal | ICD-10-CM

## 2015-09-19 DIAGNOSIS — Z7982 Long term (current) use of aspirin: Secondary | ICD-10-CM

## 2015-09-19 DIAGNOSIS — N3289 Other specified disorders of bladder: Secondary | ICD-10-CM | POA: Diagnosis present

## 2015-09-19 DIAGNOSIS — G8929 Other chronic pain: Secondary | ICD-10-CM | POA: Diagnosis present

## 2015-09-19 DIAGNOSIS — Z79899 Other long term (current) drug therapy: Secondary | ICD-10-CM | POA: Diagnosis not present

## 2015-09-19 DIAGNOSIS — E1142 Type 2 diabetes mellitus with diabetic polyneuropathy: Secondary | ICD-10-CM | POA: Diagnosis present

## 2015-09-19 DIAGNOSIS — Z6841 Body Mass Index (BMI) 40.0 and over, adult: Secondary | ICD-10-CM

## 2015-09-19 DIAGNOSIS — F329 Major depressive disorder, single episode, unspecified: Secondary | ICD-10-CM | POA: Diagnosis present

## 2015-09-19 DIAGNOSIS — E785 Hyperlipidemia, unspecified: Secondary | ICD-10-CM

## 2015-09-19 DIAGNOSIS — R300 Dysuria: Secondary | ICD-10-CM

## 2015-09-19 DIAGNOSIS — E876 Hypokalemia: Secondary | ICD-10-CM

## 2015-09-19 DIAGNOSIS — K629 Disease of anus and rectum, unspecified: Secondary | ICD-10-CM

## 2015-09-19 DIAGNOSIS — C548 Malignant neoplasm of overlapping sites of corpus uteri: Secondary | ICD-10-CM | POA: Diagnosis not present

## 2015-09-19 DIAGNOSIS — Z7984 Long term (current) use of oral hypoglycemic drugs: Secondary | ICD-10-CM | POA: Diagnosis not present

## 2015-09-19 DIAGNOSIS — J45909 Unspecified asthma, uncomplicated: Secondary | ICD-10-CM | POA: Diagnosis present

## 2015-09-19 DIAGNOSIS — E669 Obesity, unspecified: Secondary | ICD-10-CM | POA: Diagnosis present

## 2015-09-19 DIAGNOSIS — G4700 Insomnia, unspecified: Secondary | ICD-10-CM

## 2015-09-19 DIAGNOSIS — R3911 Hesitancy of micturition: Secondary | ICD-10-CM

## 2015-09-19 DIAGNOSIS — N2889 Other specified disorders of kidney and ureter: Secondary | ICD-10-CM

## 2015-09-19 DIAGNOSIS — E119 Type 2 diabetes mellitus without complications: Secondary | ICD-10-CM

## 2015-09-19 DIAGNOSIS — R52 Pain, unspecified: Secondary | ICD-10-CM

## 2015-09-19 DIAGNOSIS — G629 Polyneuropathy, unspecified: Secondary | ICD-10-CM

## 2015-09-19 DIAGNOSIS — F17210 Nicotine dependence, cigarettes, uncomplicated: Secondary | ICD-10-CM

## 2015-09-19 DIAGNOSIS — C787 Secondary malignant neoplasm of liver and intrahepatic bile duct: Secondary | ICD-10-CM | POA: Diagnosis present

## 2015-09-19 DIAGNOSIS — Z7951 Long term (current) use of inhaled steroids: Secondary | ICD-10-CM

## 2015-09-19 DIAGNOSIS — N1330 Unspecified hydronephrosis: Secondary | ICD-10-CM | POA: Diagnosis present

## 2015-09-19 DIAGNOSIS — M549 Dorsalgia, unspecified: Secondary | ICD-10-CM

## 2015-09-19 DIAGNOSIS — C55 Malignant neoplasm of uterus, part unspecified: Secondary | ICD-10-CM

## 2015-09-19 DIAGNOSIS — M5136 Other intervertebral disc degeneration, lumbar region: Secondary | ICD-10-CM

## 2015-09-19 DIAGNOSIS — C7800 Secondary malignant neoplasm of unspecified lung: Secondary | ICD-10-CM | POA: Diagnosis present

## 2015-09-19 HISTORY — DX: Malignant (primary) neoplasm, unspecified: C80.1

## 2015-09-19 LAB — URINALYSIS COMPLETE WITH MICROSCOPIC (ARMC ONLY)
BILIRUBIN URINE: NEGATIVE
Bacteria, UA: NONE SEEN
Glucose, UA: NEGATIVE mg/dL
KETONES UR: NEGATIVE mg/dL
LEUKOCYTES UA: NEGATIVE
Nitrite: NEGATIVE
PH: 5 (ref 5.0–8.0)
Protein, ur: 100 mg/dL — AB
Specific Gravity, Urine: 1.006 (ref 1.005–1.030)

## 2015-09-19 LAB — COMPREHENSIVE METABOLIC PANEL
ALT: 22 U/L (ref 14–54)
ANION GAP: 10 (ref 5–15)
AST: 29 U/L (ref 15–41)
Albumin: 3.2 g/dL — ABNORMAL LOW (ref 3.5–5.0)
Alkaline Phosphatase: 91 U/L (ref 38–126)
BILIRUBIN TOTAL: 0.7 mg/dL (ref 0.3–1.2)
BUN: 12 mg/dL (ref 6–20)
CALCIUM: 8.6 mg/dL — AB (ref 8.9–10.3)
CO2: 31 mmol/L (ref 22–32)
Chloride: 94 mmol/L — ABNORMAL LOW (ref 101–111)
Creatinine, Ser: 0.93 mg/dL (ref 0.44–1.00)
Glucose, Bld: 190 mg/dL — ABNORMAL HIGH (ref 65–99)
POTASSIUM: 3.1 mmol/L — AB (ref 3.5–5.1)
Sodium: 135 mmol/L (ref 135–145)
TOTAL PROTEIN: 8.1 g/dL (ref 6.5–8.1)

## 2015-09-19 LAB — CBC WITH DIFFERENTIAL/PLATELET
BASOS PCT: 1 %
Basophils Absolute: 0.1 10*3/uL (ref 0–0.1)
Eosinophils Absolute: 0.2 10*3/uL (ref 0–0.7)
Eosinophils Relative: 2 %
HEMATOCRIT: 35 % (ref 35.0–47.0)
Hemoglobin: 11.8 g/dL — ABNORMAL LOW (ref 12.0–16.0)
LYMPHS ABS: 2.9 10*3/uL (ref 1.0–3.6)
LYMPHS PCT: 22 %
MCH: 29.5 pg (ref 26.0–34.0)
MCHC: 33.8 g/dL (ref 32.0–36.0)
MCV: 87.2 fL (ref 80.0–100.0)
MONO ABS: 1.2 10*3/uL — AB (ref 0.2–0.9)
MONOS PCT: 9 %
NEUTROS ABS: 8.5 10*3/uL — AB (ref 1.4–6.5)
Neutrophils Relative %: 66 %
Platelets: 196 10*3/uL (ref 150–440)
RBC: 4.02 MIL/uL (ref 3.80–5.20)
RDW: 13.3 % (ref 11.5–14.5)
WBC: 12.8 10*3/uL — ABNORMAL HIGH (ref 3.6–11.0)

## 2015-09-19 MED ORDER — NALOXONE HCL 0.4 MG/ML IJ SOLN: 0.4000 mg | INTRAMUSCULAR | Status: DC | PRN

## 2015-09-19 MED ORDER — SODIUM CHLORIDE 0.9% FLUSH
3.0000 mL | INTRAVENOUS | Status: DC | PRN
  Administered 2015-09-19 (×2): 3 mL via INTRAVENOUS
  Filled 2015-09-19 (×2): qty 3

## 2015-09-19 MED ORDER — ALBUTEROL SULFATE (2.5 MG/3ML) 0.083% IN NEBU: 2.5000 mg | INHALATION_SOLUTION | Freq: Four times a day (QID) | RESPIRATORY_TRACT | Status: DC | PRN

## 2015-09-19 MED ORDER — HYDROMORPHONE HCL 1 MG/ML IJ SOLN: 1.0000 mg | INTRAMUSCULAR | Status: DC | PRN

## 2015-09-19 MED ORDER — SODIUM CHLORIDE 0.9% FLUSH: 9.0000 mL | INTRAVENOUS | Status: DC | PRN

## 2015-09-19 MED ORDER — TAMSULOSIN HCL 0.4 MG PO CAPS
0.4000 mg | ORAL_CAPSULE | Freq: Every day | ORAL | Status: DC
  Administered 2015-09-19 – 2015-09-21 (×3): 0.4 mg via ORAL
  Filled 2015-09-19 (×3): qty 1

## 2015-09-19 MED ORDER — TRAZODONE HCL 100 MG PO TABS
300.0000 mg | ORAL_TABLET | Freq: Every evening | ORAL | Status: DC | PRN
  Administered 2015-09-19 – 2015-09-20 (×2): 300 mg via ORAL
  Filled 2015-09-19 (×2): qty 3

## 2015-09-19 MED ORDER — ONDANSETRON HCL 4 MG/2ML IJ SOLN: 4.0000 mg | Freq: Four times a day (QID) | INTRAMUSCULAR | Status: DC | PRN

## 2015-09-19 MED ORDER — HYDROMORPHONE HCL 1 MG/ML IJ SOLN
1.0000 mg | Freq: Once | INTRAMUSCULAR | Status: AC
Start: 2015-09-19 — End: 2015-09-19
  Administered 2015-09-19: 1 mg via INTRAMUSCULAR

## 2015-09-19 MED ORDER — PRAVASTATIN SODIUM 40 MG PO TABS
40.0000 mg | ORAL_TABLET | Freq: Every day | ORAL | Status: DC
  Administered 2015-09-19 – 2015-09-20 (×2): 40 mg via ORAL
  Filled 2015-09-19 (×2): qty 1

## 2015-09-19 MED ORDER — DIPHENHYDRAMINE HCL 12.5 MG/5ML PO ELIX
12.5000 mg | ORAL_SOLUTION | Freq: Four times a day (QID) | ORAL | Status: DC | PRN
  Filled 2015-09-19: qty 5

## 2015-09-19 MED ORDER — ALBUTEROL SULFATE HFA 108 (90 BASE) MCG/ACT IN AERS: 1.0000 | INHALATION_SPRAY | Freq: Four times a day (QID) | RESPIRATORY_TRACT | Status: DC | PRN

## 2015-09-19 MED ORDER — BUDESONIDE 0.25 MG/2ML IN SUSP
0.2500 mg | Freq: Two times a day (BID) | RESPIRATORY_TRACT | Status: DC
  Administered 2015-09-19 – 2015-09-21 (×4): 0.25 mg via RESPIRATORY_TRACT
  Filled 2015-09-19 (×4): qty 2

## 2015-09-19 MED ORDER — ACETAMINOPHEN 650 MG RE SUPP: 650.0000 mg | Freq: Four times a day (QID) | RECTAL | Status: DC | PRN

## 2015-09-19 MED ORDER — OXYCODONE HCL 5 MG PO TABS
ORAL_TABLET | ORAL | Status: AC
  Filled 2015-09-19: qty 2

## 2015-09-19 MED ORDER — GABAPENTIN 600 MG PO TABS
600.0000 mg | ORAL_TABLET | Freq: Two times a day (BID) | ORAL | Status: DC
  Administered 2015-09-19: 600 mg via ORAL
  Filled 2015-09-19 (×2): qty 1

## 2015-09-19 MED ORDER — BUPROPION HCL ER (SR) 150 MG PO TB12
150.0000 mg | ORAL_TABLET | Freq: Every day | ORAL | Status: DC
  Administered 2015-09-19 – 2015-09-21 (×3): 150 mg via ORAL
  Filled 2015-09-19 (×3): qty 1

## 2015-09-19 MED ORDER — VILAZODONE HCL 40 MG PO TABS: 40.0000 mg | ORAL_TABLET | Freq: Every day | ORAL | Status: DC

## 2015-09-19 MED ORDER — HYDROMORPHONE HCL 1 MG/ML IJ SOLN
1.0000 mg | Freq: Once | INTRAMUSCULAR | Status: AC
  Administered 2015-09-19: 1 mg via INTRAVENOUS

## 2015-09-19 MED ORDER — OXYCODONE HCL ER 10 MG PO T12A
40.0000 mg | EXTENDED_RELEASE_TABLET | Freq: Two times a day (BID) | ORAL | Status: DC
  Administered 2015-09-19 – 2015-09-20 (×2): 40 mg via ORAL
  Filled 2015-09-19 (×2): qty 4

## 2015-09-19 MED ORDER — HYDROMORPHONE 1 MG/ML IV SOLN: INTRAVENOUS | Status: DC

## 2015-09-19 MED ORDER — DOCUSATE SODIUM 100 MG PO CAPS
100.0000 mg | ORAL_CAPSULE | Freq: Two times a day (BID) | ORAL | Status: DC
  Administered 2015-09-19 – 2015-09-21 (×5): 100 mg via ORAL
  Filled 2015-09-19 (×5): qty 1

## 2015-09-19 MED ORDER — LISINOPRIL 10 MG PO TABS
10.0000 mg | ORAL_TABLET | ORAL | Status: DC
  Administered 2015-09-20 – 2015-09-21 (×2): 10 mg via ORAL
  Filled 2015-09-19 (×2): qty 1

## 2015-09-19 MED ORDER — TIZANIDINE HCL 4 MG PO TABS: 2.0000 mg | ORAL_TABLET | Freq: Four times a day (QID) | ORAL | Status: DC | PRN

## 2015-09-19 MED ORDER — ASPIRIN EC 81 MG PO TBEC
81.0000 mg | DELAYED_RELEASE_TABLET | ORAL | Status: DC
  Administered 2015-09-20 – 2015-09-21 (×2): 81 mg via ORAL
  Filled 2015-09-19 (×2): qty 1

## 2015-09-19 MED ORDER — VILAZODONE HCL 20 MG PO TABS
60.0000 mg | ORAL_TABLET | Freq: Every day | ORAL | Status: DC
  Administered 2015-09-19 – 2015-09-21 (×3): 60 mg via ORAL
  Filled 2015-09-19 (×3): qty 3

## 2015-09-19 MED ORDER — HYDROMORPHONE HCL 1 MG/ML IJ SOLN
INTRAMUSCULAR | Status: AC
  Filled 2015-09-19: qty 1

## 2015-09-19 MED ORDER — POLYETHYLENE GLYCOL 3350 17 G PO PACK
17.0000 g | PACK | Freq: Every day | ORAL | Status: DC | PRN
  Administered 2015-09-19: 17 g via ORAL
  Filled 2015-09-19 (×2): qty 1

## 2015-09-19 MED ORDER — SENNOSIDES-DOCUSATE SODIUM 8.6-50 MG PO TABS
2.0000 | ORAL_TABLET | Freq: Every day | ORAL | Status: DC
  Administered 2015-09-19 – 2015-09-21 (×3): 2 via ORAL
  Filled 2015-09-19 (×3): qty 2

## 2015-09-19 MED ORDER — ENOXAPARIN SODIUM 40 MG/0.4ML ~~LOC~~ SOLN
40.0000 mg | Freq: Two times a day (BID) | SUBCUTANEOUS | Status: DC
  Administered 2015-09-19 – 2015-09-21 (×4): 40 mg via SUBCUTANEOUS
  Filled 2015-09-19 (×4): qty 0.4

## 2015-09-19 MED ORDER — ACETAMINOPHEN 325 MG PO TABS: 650.0000 mg | ORAL_TABLET | Freq: Four times a day (QID) | ORAL | Status: DC | PRN

## 2015-09-19 MED ORDER — ALPRAZOLAM 1 MG PO TABS
2.0000 mg | ORAL_TABLET | Freq: Two times a day (BID) | ORAL | Status: DC | PRN
  Administered 2015-09-19 – 2015-09-21 (×4): 2 mg via ORAL
  Filled 2015-09-19 (×4): qty 2

## 2015-09-19 MED ORDER — OXYCODONE HCL 5 MG PO TABS
10.0000 mg | ORAL_TABLET | Freq: Once | ORAL | Status: AC
Start: 2015-09-19 — End: 2015-09-19
  Administered 2015-09-19: 10 mg via ORAL

## 2015-09-19 MED ORDER — OXYBUTYNIN CHLORIDE 5 MG PO TABS
5.0000 mg | ORAL_TABLET | Freq: Three times a day (TID) | ORAL | Status: DC
  Administered 2015-09-19 – 2015-09-21 (×6): 5 mg via ORAL
  Filled 2015-09-19 (×8): qty 1

## 2015-09-19 MED ORDER — SILVER SULFADIAZINE 1 % EX CREA
TOPICAL_CREAM | Freq: Every day | CUTANEOUS | Status: DC
  Administered 2015-09-19: 1 via TOPICAL
  Administered 2015-09-20 – 2015-09-21 (×2): via TOPICAL
  Filled 2015-09-19: qty 85

## 2015-09-19 MED ORDER — BISACODYL 10 MG RE SUPP: 10.0000 mg | Freq: Every day | RECTAL | Status: DC | PRN

## 2015-09-19 MED ORDER — HYDROMORPHONE HCL 1 MG/ML IJ SOLN
1.5000 mg | INTRAMUSCULAR | Status: DC | PRN
  Administered 2015-09-19 – 2015-09-20 (×6): 1.5 mg via INTRAVENOUS
  Filled 2015-09-19 (×6): qty 2

## 2015-09-19 MED ORDER — ONDANSETRON HCL 4 MG PO TABS: 4.0000 mg | ORAL_TABLET | Freq: Four times a day (QID) | ORAL | Status: DC | PRN

## 2015-09-19 MED ORDER — LACTULOSE 10 GM/15ML PO SOLN
30.0000 g | Freq: Two times a day (BID) | ORAL | Status: DC | PRN
Start: 2015-09-19 — End: 2015-09-21

## 2015-09-19 MED ORDER — DIPHENHYDRAMINE HCL 50 MG/ML IJ SOLN: 12.5000 mg | Freq: Four times a day (QID) | INTRAMUSCULAR | Status: DC | PRN

## 2015-09-19 MED ORDER — BECLOMETHASONE DIPROPIONATE 40 MCG/ACT IN AERS: 2.0000 | INHALATION_SPRAY | Freq: Two times a day (BID) | RESPIRATORY_TRACT | Status: DC

## 2015-09-19 NOTE — H&P (Signed)
[facility] NAME: Annette [name]    MR#:  [id number]  DATE OF BIRTH:  23-Feb-1960   DATE OF ADMISSION:  09/19/2015  PRIMARY CARE PHYSICIAN: Harriett Neita Carp, MD   REQUESTING/REFERRING PHYSICIAN: Lord  CHIEF COMPLAINT:   Chief Complaint  Patient presents with  . Abdominal Pain    HISTORY OF PRESENT ILLNESS:  Annette [name]  is a 55 y.o. female with a known history of Uterine cancer with widely metastatic disease presenting with worsening abdominal pain. She states she seems to be well controlled on the Nucynta however over the last 2 days her abdominal pain is increased she describes a currently a 7/10, suprapubic periumbilical in location and nonradiating no worsening or relieving factors. He states it feels she has to urinate but is also think she is unable to do so. She had a Foley catheter in place in emergency department with removal of urine however placement caused excruciating pain thus Foley removed.  Last bowel movement yesterday  PAST MEDICAL HISTORY:   Past Medical History:  Diagnosis Date  . Asthma   . Back pain   . DDD (degenerative disc disease), lumbar   . Depression   . Diabetes mellitus without complication ([place])   . Hyperlipidemia   . Hypertension   . Insomnia   . Menopausal syndrome   . Obesity   . Onychomycosis   . Peripheral neuropathy ([place])   . Renal insufficiency     PAST SURGICAL HISTORY:   Past Surgical History:  Procedure Laterality Date  . COSMETIC SURGERY    . JOINT REPLACEMENT Bilateral [id number]  . LIVER BIOPSY    . REDUCTION MAMMAPLASTY Bilateral 12/15/2013  . SHOULDER SURGERY Left     SOCIAL HISTORY:   Social History  Substance Use Topics  . Smoking status: Former Smoker    Quit date: 08/19/2015  . Smokeless tobacco: Never Used  . Alcohol use No    FAMILY HISTORY:   Family History  Problem Relation Age of Onset  . COPD Mother   . Sleep apnea Mother   . Alcohol abuse  Mother   . Depression Mother   . Stroke Mother   . Heart disease Father   . Heart disease Sister     DRUG ALLERGIES:   Allergies  Allergen Reactions  . Iodides Swelling  . Iodine Swelling  . Shellfish Allergy Swelling    shrimp causes swelling  . Shellfish-Derived Products Swelling  . Iodinated Diagnostic Agents Swelling  . Povidone Iodine Swelling    eyes  . Povidone-Iodine Swelling    REVIEW OF SYSTEMS:  REVIEW OF SYSTEMS:  CONSTITUTIONAL: Denies fevers, chills, fatigue, weakness.  EYES: Denies blurred vision, double vision, or eye pain.  EARS, NOSE, THROAT: Denies tinnitus, ear pain, hearing loss.  RESPIRATORY: denies cough, shortness of breath, wheezing  CARDIOVASCULAR: Denies chest pain, palpitations, edema.  GASTROINTESTINAL: Denies nausea, vomiting, diarrhea, positive abdominal pain.  GENITOURINARY: Denies dysuria, hematuria. Bladder fullness sensation, spasms, incomplete emptying ENDOCRINE: Denies nocturia or thyroid problems. HEMATOLOGIC AND LYMPHATIC: Denies easy bruising or bleeding.  SKIN: Denies rash or lesions.  MUSCULOSKELETAL: Denies pain in neck, back, shoulder, knees, hips, or further arthritic symptoms.  NEUROLOGIC: Denies paralysis, paresthesias.  PSYCHIATRIC: Denies anxiety or depressive symptoms. Otherwise full review of systems performed by me is negative.   MEDICATIONS AT HOME:   Prior to Admission medications   Medication Sig Start Date End Date Taking? Authorizing Provider  albuterol (PROAIR HFA) 108 (  90 Base) MCG/ACT inhaler Inhale 1-2 puffs into the lungs every 6 (six) hours as needed for wheezing.    Yes Historical Provider, MD  alprazolam Duanne Moron) 2 MG tablet Take 2 mg by mouth 2 (two) times daily as needed for anxiety.    Yes Historical Provider, MD  aspirin EC 81 MG tablet Take 81 mg by mouth every morning.    Yes Historical Provider, MD  beclomethasone (QVAR) 40 MCG/ACT inhaler Inhale 2 puffs into the lungs 2 (two) times daily. Reported  on 05/26/2015   Yes Historical Provider, MD  buPROPion (WELLBUTRIN SR) 150 MG 12 hr tablet Take 150 mg by mouth daily.   Yes Historical Provider, MD  docusate sodium (COLACE) 100 MG capsule Take 100 mg by mouth 2 (two) times daily.   Yes Historical Provider, MD  gabapentin (NEURONTIN) 600 MG tablet Limit 1 tablet by mouth twice per day if tolerated 09/02/15  Yes Mohammed Kindle, MD  hydrochlorothiazide (HYDRODIURIL) 25 MG tablet Take 25 mg by mouth every morning.    Yes Historical Provider, MD  ibuprofen (ADVIL,MOTRIN) 800 MG tablet Take 800 mg by mouth every 8 (eight) hours as needed for mild pain or moderate pain.    Yes Historical Provider, MD  lactulose (CHRONULAC) 10 GM/15ML solution Take 30 g by mouth 2 (two) times daily as needed for mild constipation or moderate constipation.    Yes Historical Provider, MD  lisinopril (PRINIVIL,ZESTRIL) 10 MG tablet Take 10 mg by mouth every morning.    Yes Historical Provider, MD  metFORMIN (GLUCOPHAGE-XR) 500 MG 24 hr tablet Take 500 mg by mouth daily with breakfast.   Yes Historical Provider, MD  ondansetron (ZOFRAN) 4 MG tablet Take 4 mg by mouth every 8 (eight) hours as needed for nausea or vomiting.    Yes Historical Provider, MD  Oxycodone HCl 10 MG TABS Take 2 tablets (20 mg total) by mouth every 4 (four) hours as needed. Limit 1 tablet by mouth every 4 hours as needed for breakthrough pain while taking Nucynta if tolerated 09/08/15  Yes Srikar Sudini, MD  pravastatin (PRAVACHOL) 40 MG tablet Take 40 mg by mouth at bedtime.    Yes Historical Provider, MD  senna-docusate (SENOKOT-S) 8.6-50 MG tablet Take 2 tablets by mouth daily. Patient taking differently: Take 2 tablets by mouth every morning.  08/28/15  Yes Bettey Costa, MD  silver sulfADIAZINE (SILVADENE) 1 % cream Apply topically daily. Patient taking differently: Apply 1 application topically daily.  08/28/15  Yes Bettey Costa, MD  Tapentadol HCl (NUCYNTA ER) 150 MG TB12 Limit 1 tab by mouth  every 8 -12  hours IF tolerated   (Note pill is now 150 mg and much stronger) Patient taking differently: Take 150 mg by mouth 2 (two) times daily. Limit 1 tab by mouth  every 8 -12 hours IF tolerated   (Note pill is now 150 mg and much stronger) 09/02/15  Yes Mohammed Kindle, MD  tiZANidine (ZANAFLEX) 2 MG tablet Limit 1 tablet by mouth 2-4 times per day if tolerated 09/02/15  Yes Mohammed Kindle, MD  traZODone (DESYREL) 150 MG tablet Take 300 mg by mouth at bedtime as needed for sleep.    Yes Historical Provider, MD  Vilazodone HCl (VIIBRYD) 20 MG TABS Take 20 mg by mouth daily. Takes with 40mg  for total of 60mg  daily   Yes Historical Provider, MD  Vilazodone HCl (VIIBRYD) 40 MG TABS Take 40 mg by mouth daily. Takes with 20mg  for total of 60mg  daily  Yes Historical Provider, MD      VITAL SIGNS:  Blood pressure (!) 145/84, pulse (!) 102, temperature 98 F (36.7 C), temperature source Oral, resp. rate 18, height 5\' 5"  (1.651 m), weight 122.5 kg (270 lb), SpO2 99 %.  PHYSICAL EXAMINATION:  VITAL SIGNS: Vitals:   09/19/15 0721 09/19/15 1023  BP: 131/77 (!) 145/84  Pulse: (!) 103 (!) 102  Resp: 18 18  Temp: 98 F (36.7 C)    GENERAL:54 y.o.female currently in no acute distress.  HEAD: Normocephalic, atraumatic.  EYES: Pupils equal, round, reactive to light. Extraocular muscles intact. No scleral icterus.  MOUTH: Moist mucosal membrane. Dentition intact. No abscess noted.  EAR, NOSE, THROAT: Clear without exudates. No external lesions.  NECK: Supple. No thyromegaly. No nodules. No JVD.  PULMONARY: Clear to ascultation, without wheeze rails or rhonci. No use of accessory muscles, Good respiratory effort. good air entry bilaterally CHEST: Nontender to palpation.  CARDIOVASCULAR: S1 and S2. Regular rate and rhythm. No murmurs, rubs, or gallops. No edema. Pedal pulses 2+ bilaterally.  GASTROINTESTINAL: Soft, nontender, nondistended. No masses. Positive bowel sounds. No hepatosplenomegaly.  MUSCULOSKELETAL:  No swelling, clubbing, or edema. Range of motion full in all extremities.  NEUROLOGIC: Cranial nerves II through XII are intact. No gross focal neurological deficits. Sensation intact. Reflexes intact.  SKIN: No ulceration, lesions, rashes, or cyanosis. Skin warm and dry. Turgor intact.  PSYCHIATRIC: Mood, affect within normal limits. The patient is awake, alert and oriented x 3. Insight, judgment intact.    LABORATORY PANEL:   CBC  Recent Labs Lab 09/19/15 0737  WBC 12.8*  HGB 11.8*  HCT 35.0  PLT 196   ------------------------------------------------------------------------------------------------------------------  Chemistries   Recent Labs Lab 09/19/15 0737  NA 135  K 3.1*  CL 94*  CO2 31  GLUCOSE 190*  BUN 12  CREATININE 0.93  CALCIUM 8.6*  AST 29  ALT 22  ALKPHOS 91  BILITOT 0.7   ------------------------------------------------------------------------------------------------------------------  Cardiac Enzymes No results for input(s): TROPONINI in the last 168 hours. ------------------------------------------------------------------------------------------------------------------  RADIOLOGY:  Ct Abdomen Pelvis Wo Contrast  Result Date: 09/19/2015 CLINICAL DATA:  Lower abdominal pain EXAM: CT ABDOMEN AND PELVIS WITHOUT CONTRAST TECHNIQUE: Multidetector CT imaging of the abdomen and pelvis was performed following the standard protocol without IV contrast. COMPARISON:  09/05/2005 FINDINGS: Innumerable bilateral pulmonary nodules are stable Innumerable lesions throughout the liver are stable. Spleen, pancreas, adrenal glands, gallbladder are within normal limits Bilateral hydronephrosis is stable. Markedly enlarged uterus consistent with the patient's known malignancy has grown in size. On sagittal reconstruction imaging, the uterus measures 9.7 x 16.9 cm. Previously, based on my measurements, it measured 9.0 x 14 cm. It extends into the lower abdomen. No evidence of  small-bowel obstruction. Appendix is within normal limits. The rectum is somewhat difficult to visualize. There is suspected wall thickening of the upper rectum. See image 83 of series 7. This may represent spread of malignancy or possibly an inflammatory process. Foley catheter decompresses the bladder. There is no free fluid. No vertebral compression deformity. IMPRESSION: Over the course of 2 weeks, there has been in increasing size of the uterus worrisome for worsening malignancy. Metastatic disease in the liver and lung bases is stable. There is wall thickening of the rectum representing either spread of tumor or possibly an inflammatory process. Foley catheter decompresses the bladder. Hydronephrosis persists and is likely related to compression of the ureters from the enlarged uterus. Electronically Signed   By: Marybelle Killings M.D.   On:  09/19/2015 10:09    EKG:   Orders placed or performed in visit on 09/06/15  . EKG 12-Lead  . EKG 12-Lead  . EKG 12-Lead    IMPRESSION AND PLAN:   55 year old African-American female with known widely metastatic uterine cancer who is presenting for intractable pain. At the pleasure of meeting this unfortunate lady on last admission, her pain was well-controlled on discharge however she has progressed, with evidence of disease progression on imaging over the last few weeks.  1. Intractable pain secondary to widely metastatic uterine cancer: At home takes Nucynta 150 mg by mouth twice a day which is roughly equivalent To 40 mg oxycodone extended release twice a day-continue this medication, she was also taking oxycodone 20 mg by mouth every 4 hours but stating this was not helping. Given her disease progression will consult oncology, palliative care-who are very familiar with this case. As far as pain control is concerned as needed Dilaudid in emergency department when able will transition to Dilaudid PCA, discontinuing the remainder when necessary treatments at  that time. Continue with bowel regimen   2. Bladder spasm: Oxybutynin, however this is likely related to mass effect from cancer 3. Hyperlipidemia unspecified: Statin therapy 4. Essential hypertension: Lisinopril, hold hydrochlorothiazide 5. Type 2 diabetes non-insulin-requiring: Hold oral agents at insulin sliding scale coverage    All the records are reviewed and case discussed with ED provider. Management plans discussed with the patient, family and they are in agreement.  CODE STATUS: Full  TOTAL TIME TAKING CARE OF THIS PATIENT: 60 minutes.    Hower,  Karenann Cai.D on 09/19/2015 at 12:37 PM  Between 7am to 6pm - Pager - [phone]  After 6pm: House Pager: - [phone]  [facility]  Office  [phone]  CC: Primary care physician; Ellamae Sia, MD

## 2015-09-19 NOTE — ED Provider Notes (Signed)
[facility] Emergency Department Provider Note ____________________________________________   I have reviewed the triage vital signs and the triage nursing note.  HISTORY  Chief Complaint Abdominal Pain   Historian Patient and her friend is also at the bedside  HPI JOZELYNN HILLERY is a 55 y.o. female with extensive metastatic uterine cancer, under palliative treatments with Dr. Rogue Bussing, here with complaint of feeling like she has to pee, and only going a small amount.  Mild abdominal discomfort, much improved now on chronic pain management.  No fevers.  Symptoms moderate.  Nothing makes it worse or better.    Past Medical History:  Diagnosis Date  . Asthma   . Back pain   . DDD (degenerative disc disease), lumbar   . Depression   . Diabetes mellitus without complication ([place])   . Hyperlipidemia   . Hypertension   . Insomnia   . Menopausal syndrome   . Obesity   . Onychomycosis   . Peripheral neuropathy ([place])   . Renal insufficiency     Patient Active Problem List   Diagnosis Date Noted  . Generalized abdominal pain   . Palliative care encounter   . Chronic pain   . Bilateral hydronephrosis 09/06/2015  . Sore on leg 08/24/2015  . Intractable pain 08/23/2015  . HTN (hypertension) 08/23/2015  . HLD (hyperlipidemia) 08/23/2015  . Cancer of uterine body ([place]) 08/18/2015  . Malignant neoplasm of overlapping sites of body of uterus ([place]) 08/18/2015  . Uterine mass 07/28/2015  . 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 DDD (degenerative disc disease), lumbar 06/24/2014  . Facet syndrome, lumbar 06/24/2014  . Neuropathy due to secondary diabetes ([place]) 06/24/2014  . DDD (degenerative disc disease), cervical 06/24/2014  . Occipital neuralgia 06/24/2014  . LBP (low back pain) 06/24/2014  . Degeneration of intervertebral disc of cervical region 06/24/2014  . Degeneration of intervertebral disc of lumbar region 06/24/2014  . Diabetes mellitus ([place])  06/24/2014  . Large breasts 05/30/2012    Past Surgical History:  Procedure Laterality Date  . COSMETIC SURGERY    . JOINT REPLACEMENT Bilateral [id number]  . LIVER BIOPSY    . REDUCTION MAMMAPLASTY Bilateral 12/15/2013  . SHOULDER SURGERY Left     Prior to Admission medications   Medication Sig Start Date End Date Taking? Authorizing Provider  albuterol (PROAIR HFA) 108 (90 Base) MCG/ACT inhaler Inhale 1-2 puffs into the lungs every 6 (six) hours as needed for wheezing.    Yes Historical Provider, MD  alprazolam Duanne Moron) 2 MG tablet Take 2 mg by mouth 2 (two) times daily as needed for anxiety.    Yes Historical Provider, MD  aspirin EC 81 MG tablet Take 81 mg by mouth every morning.    Yes Historical Provider, MD  beclomethasone (QVAR) 40 MCG/ACT inhaler Inhale 2 puffs into the lungs 2 (two) times daily. Reported on 05/26/2015   Yes Historical Provider, MD  buPROPion (WELLBUTRIN SR) 150 MG 12 hr tablet Take 150 mg by mouth daily.   Yes Historical Provider, MD  docusate sodium (COLACE) 100 MG capsule Take 100 mg by mouth 2 (two) times daily.   Yes Historical Provider, MD  gabapentin (NEURONTIN) 600 MG tablet Limit 1 tablet by mouth twice per day if tolerated 09/02/15  Yes Mohammed Kindle, MD  hydrochlorothiazide (HYDRODIURIL) 25 MG tablet Take 25 mg by mouth every morning.    Yes Historical Provider, MD  ibuprofen (ADVIL,MOTRIN) 800 MG tablet Take 800 mg by mouth every 8 (eight) hours  as needed for mild pain or moderate pain.    Yes Historical Provider, MD  lactulose (CHRONULAC) 10 GM/15ML solution Take 30 g by mouth 2 (two) times daily as needed for mild constipation or moderate constipation.    Yes Historical Provider, MD  lisinopril (PRINIVIL,ZESTRIL) 10 MG tablet Take 10 mg by mouth every morning.    Yes Historical Provider, MD  metFORMIN (GLUCOPHAGE-XR) 500 MG 24 hr tablet Take 500 mg by mouth daily with breakfast.   Yes Historical Provider, MD  ondansetron (ZOFRAN) 4 MG tablet Take 4 mg by  mouth every 8 (eight) hours as needed for nausea or vomiting.    Yes Historical Provider, MD  Oxycodone HCl 10 MG TABS Take 2 tablets (20 mg total) by mouth every 4 (four) hours as needed. Limit 1 tablet by mouth every 4 hours as needed for breakthrough pain while taking Nucynta if tolerated 09/08/15  Yes Srikar Sudini, MD  pravastatin (PRAVACHOL) 40 MG tablet Take 40 mg by mouth at bedtime.    Yes Historical Provider, MD  senna-docusate (SENOKOT-S) 8.6-50 MG tablet Take 2 tablets by mouth daily. Patient taking differently: Take 2 tablets by mouth every morning.  08/28/15  Yes Bettey Costa, MD  silver sulfADIAZINE (SILVADENE) 1 % cream Apply topically daily. Patient taking differently: Apply 1 application topically daily.  08/28/15  Yes Bettey Costa, MD  Tapentadol HCl (NUCYNTA ER) 150 MG TB12 Limit 1 tab by mouth  every 8 -12 hours IF tolerated   (Note pill is now 150 mg and much stronger) Patient taking differently: Take 150 mg by mouth 2 (two) times daily. Limit 1 tab by mouth  every 8 -12 hours IF tolerated   (Note pill is now 150 mg and much stronger) 09/02/15  Yes Mohammed Kindle, MD  tiZANidine (ZANAFLEX) 2 MG tablet Limit 1 tablet by mouth 2-4 times per day if tolerated 09/02/15  Yes Mohammed Kindle, MD  traZODone (DESYREL) 150 MG tablet Take 300 mg by mouth at bedtime as needed for sleep.    Yes Historical Provider, MD  Vilazodone HCl (VIIBRYD) 20 MG TABS Take 20 mg by mouth daily. Takes with 40mg  for total of 60mg  daily   Yes Historical Provider, MD  Vilazodone HCl (VIIBRYD) 40 MG TABS Take 40 mg by mouth daily. Takes with 20mg  for total of 60mg  daily   Yes Historical Provider, MD    Allergies  Allergen Reactions  . Iodides Swelling  . Iodine Swelling  . Shellfish Allergy Swelling    shrimp causes swelling  . Shellfish-Derived Products Swelling  . Iodinated Diagnostic Agents Swelling  . Povidone Iodine Swelling    eyes  . Povidone-Iodine Swelling    Family History  Problem Relation Age of  Onset  . COPD Mother   . Sleep apnea Mother   . Alcohol abuse Mother   . Depression Mother   . Stroke Mother   . Heart disease Father   . Heart disease Sister     Social History Social History  Substance Use Topics  . Smoking status: Former Smoker    Quit date: 08/19/2015  . Smokeless tobacco: Never Used  . Alcohol use No    Review of Systems  Constitutional: Negative for fever. Eyes: Negative for visual changes. ENT: Negative for sore throat. Cardiovascular: Negative for chest pain. Respiratory: Negative for shortness of breath. Gastrointestinal: No vomiting or diarrhea. Chronic abdominal pain. Genitourinary: Positive for dysuria, negative for hematuria. Musculoskeletal: Positive for some chronic back pain. Skin: Negative for rash.  Neurological: Negative for headache. 10 point Review of Systems otherwise negative ____________________________________________   PHYSICAL EXAM:  VITAL SIGNS: ED Triage Vitals  Enc Vitals Group     BP 09/19/15 0721 131/77     Pulse Rate 09/19/15 0721 (!) 103     Resp 09/19/15 0721 18     Temp 09/19/15 0721 98 F (36.7 C)     Temp Source 09/19/15 0721 Oral     SpO2 09/19/15 0721 95 %     Weight 09/19/15 0721 270 lb (122.5 kg)     Height 09/19/15 0721 5\' 5"  (1.651 m)     Head Circumference --      Peak Flow --      Pain Score 09/19/15 0723 8     Pain Loc --      Pain Edu? --      Excl. in [place]? --      Constitutional: Alert and oriented. Well appearing and in no distress. HEENT   Head: Normocephalic and atraumatic.      Eyes: Conjunctivae are normal. PERRL. Normal extraocular movements.      Ears:         Nose: No congestion/rhinnorhea.   Mouth/Throat: Mucous membranes are moist.   Neck: No stridor. Cardiovascular/Chest: Normal rate, regular rhythm.  No murmurs, rubs, or gallops. Respiratory: Normal respiratory effort without tachypnea nor retractions. Breath sounds are clear and equal bilaterally. No  wheezes/rales/rhonchi. Gastrointestinal: Soft. No distention, no guarding, no rebound.  Mild lower abdominal tenderness.  Obese.  Genitourinary/rectal:Deferred Musculoskeletal: Nontender with normal range of motion in all extremities. No joint effusions.  No lower extremity tenderness.  No edema. Neurologic:  Normal speech and language. No gross or focal neurologic deficits are appreciated. Skin:  Skin is warm, dry and intact. No rash noted. Psychiatric: Mood and affect are normal. Speech and behavior are normal. Patient exhibits appropriate insight and judgment.  ____________________________________________   EKG I, Lisa Roca, MD, the attending physician have personally viewed and interpreted all ECGs.  None ____________________________________________  LABS (pertinent positives/negatives)  Labs Reviewed  URINALYSIS COMPLETEWITH MICROSCOPIC ([facility] ONLY) - Abnormal; Notable for the following:       Result Value   Color, Urine YELLOW (*)    APPearance HAZY (*)    Hgb urine dipstick 1+ (*)    Protein, ur 100 (*)    Squamous Epithelial / LPF 0-5 (*)    All other components within normal limits  CBC WITH DIFFERENTIAL/PLATELET - Abnormal; Notable for the following:    WBC 12.8 (*)    Hemoglobin 11.8 (*)    Neutro Abs 8.5 (*)    Monocytes Absolute 1.2 (*)    All other components within normal limits  COMPREHENSIVE METABOLIC PANEL - Abnormal; Notable for the following:    Potassium 3.1 (*)    Chloride 94 (*)    Glucose, Bld 190 (*)    Calcium 8.6 (*)    Albumin 3.2 (*)    All other components within normal limits  URINE CULTURE  CBC  CREATININE, SERUM    ____________________________________________  RADIOLOGY All Xrays were viewed by me. Imaging interpreted by Radiologist.  CT abd and pelvis: without contrast due to dye allergy  IMPRESSION: Over the course of 2 weeks, there has been in increasing size of the uterus worrisome for worsening malignancy. Metastatic  disease in the liver and lung bases is stable.  There is wall thickening of the rectum representing either spread of tumor or possibly an inflammatory process.  Foley catheter decompresses the bladder. Hydronephrosis persists and is likely related to compression of the ureters from the enlarged uterus. __________________________________________  PROCEDURES  Procedure(s) performed: None  Critical Care performed: None  ____________________________________________   ED COURSE / ASSESSMENT AND PLAN  Pertinent labs & imaging results that were available during my care of the patient were reviewed by me and considered in my medical decision making (see chart for details).   In review of her prior CT scans, she has had evidence of bilateral hydronephrosis Felt to be due to obstruction from the uterine cancer.  I concern today is either a urinary infection or possible urinary tract infection.  Urinalysis is not consistent with UTI, I will send a culture.  Initially, bladder scanner indicated there may be 900 cc, but patient lives on her own about 100 cc. Based on this, I asked the nurse to place Foley catheter, only 200 cc came out. Suspect possibly bladder scanner was wrong especially with the intra-abdominal mass, but chose to CTA to rule out bladder obstruction.  CT showed decompressed bladder. Foley was removed, as I all think that she ever had the initial bladder scanner indicator of overfilled bladder.  Throughout her stay here, she was in continual pain, and as I went back in to discuss with her reassuring results with respect to head a complaint of dysuria, she states that she was actually crying out in pain and got no sleep last night due to severe pain. She's been out of hospital about one week, but her pain is now uncontrolled again.  I reviewed her palliative care consultation in the recent hospitalization, there is no discussion about  hospitalist services. She states  that she has an aide come in for 1 or 2 hours a day. Insulin does not sound like she is able to manage her pain at home at this point in time.  I spoke with the social worker on call who recommended hospital observation again with positive consultation to discuss whether or not additional home services including hospice would be helpful, versus I also spoke with the patient her friend about possible nursing home placement for continued adequate pain control.    CONSULTATIONS:  Dr. Lavetta Nielsen, hospitalist for admission.   Patient / Family / Caregiver informed of clinical course, medical decision-making process, and agree with plan.     ___________________________________________   FINAL CLINICAL IMPRESSION(S) / ED DIAGNOSES   Final diagnoses:  Dysuria  Hypokalemia  Chronic pain  Intractable pain              Note: This dictation was prepared with Dragon dictation. Any transcriptional errors that result from this process are unintentional    Lisa Roca, MD 09/19/15 1230

## 2015-09-19 NOTE — ED Notes (Signed)
Informed RN bed ready 1236

## 2015-09-19 NOTE — ED Triage Notes (Signed)
Pt. Reports to ED with complaints of mid to lower abdominal pain and feeling unable to void. Pt. States last void this morning. Denies nausea vomiting and diarrhea. Denies blood in urine.

## 2015-09-19 NOTE — Consult Note (Signed)
[facility]  Date of admission:  09/19/2015  Inpatient day:  09/19/15  Consulting physician: Dr. Valentino Nose   Reason for Consultation:  Metastatic cancer, pain control, recurrent admission.  Chief Complaint: Annette [name] is a 55 y.o. 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  HPI:  The patient was recently diagnosed with metastatic uterine PEComa to the liver and lungs.  She has chronic back pain and is followed by the pain clinic.  She has had multiple admissions to the hospital in the last few weeks secondary to worsening abdominal pain.  She was last admitted from 09/06/2015 - 09/08/2015.  Imaging revealed new bilateral hydronephrosis.  Creatinine was stable at 1.02.  She had been on Nucynta and oxycodone per Dr. Primus Bravo.  She was managed with Dilaudid prn.  During her admission, she was seen by Palliative Care Medicine.  At the time of discharge, oxycodone was increased to 20 mg every 4 hours prn pain .  She was last seen in the medical oncology clinic by Dr. Rogue Bussing on 09/17/2015.  At that time, she received week #3 Torisel.  Plan was for repeat CT scan after 4-6 treatments.  She had a urinalysis for dysuria.  There was no evidence of infection.    She called today secondary to minimal urine output in the past 1-2 days.  Creatinine was stable at 0.93.  Abdomen and pelvic CT scan revealed increasing size of the uterus worrisome for worsening malignancy.  Metastatic disease in the liver and lung bases was stable. There was wall thickening of the rectum representing either spread of tumor or possibly an inflammatory process.  Foley catheter decompressed the bladder. Hydronephrosis persisted and was likely related to compression of the ureters from the enlarged uterus.  Symptomatically, her pain is under better control.   Past Medical History:  Diagnosis Date  . Asthma   . Back  pain   . Cancer ([place])   . DDD (degenerative disc disease), lumbar   . Depression   . Diabetes mellitus without complication ([place])   . Hyperlipidemia   . Hypertension   . Insomnia   . Menopausal syndrome   . Obesity   . Onychomycosis   . Peripheral neuropathy ([place])   . Renal insufficiency     Past Surgical History:  Procedure Laterality Date  . COSMETIC SURGERY    . JOINT REPLACEMENT Bilateral [id number]  . LIVER BIOPSY    . REDUCTION MAMMAPLASTY Bilateral 12/15/2013  . SHOULDER SURGERY Left     Family History  Problem Relation Age of Onset  . COPD Mother   . Sleep apnea Mother   . Alcohol abuse Mother   . Depression Mother   . Stroke Mother   . Heart disease Father   . Heart disease Sister     Social History:  reports that she quit smoking about 4 weeks ago. She has never used smokeless tobacco. She reports that she does not drink alcohol or use drugs.  The patient lives in [place] alone.  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  Allergies:  Allergies  Allergen Reactions  . Iodides Swelling  . Iodine Swelling  . Shellfish Allergy Swelling    shrimp causes swelling  . Shellfish-Derived Products Swelling  . Iodinated Diagnostic Agents Swelling  . Povidone Iodine Swelling    eyes  . Povidone-Iodine Swelling  Medications Prior to Admission  Medication Sig Dispense Refill  . albuterol (PROAIR HFA) 108 (90 Base) MCG/ACT inhaler Inhale 1-2 puffs into the lungs every 6 (six) hours as needed for wheezing.     Marland Kitchen alprazolam (XANAX) 2 MG tablet Take 2 mg by mouth 2 (two) times daily as needed for anxiety.     Marland Kitchen aspirin EC 81 MG tablet Take 81 mg by mouth every morning.     . beclomethasone (QVAR) 40 MCG/ACT inhaler Inhale 2 puffs into the lungs 2 (two) times daily. Reported on 05/26/2015    . buPROPion (WELLBUTRIN SR) 150 MG 12 hr tablet Take 150 mg by mouth daily.    Marland Kitchen docusate sodium (COLACE) 100 MG capsule Take 100 mg  by mouth 2 (two) times daily.    Marland Kitchen gabapentin (NEURONTIN) 600 MG tablet Limit 1 tablet by mouth twice per day if tolerated 60 tablet 0  . hydrochlorothiazide (HYDRODIURIL) 25 MG tablet Take 25 mg by mouth every morning.     Marland Kitchen ibuprofen (ADVIL,MOTRIN) 800 MG tablet Take 800 mg by mouth every 8 (eight) hours as needed for mild pain or moderate pain.     Marland Kitchen lactulose (CHRONULAC) 10 GM/15ML solution Take 30 g by mouth 2 (two) times daily as needed for mild constipation or moderate constipation.     Marland Kitchen lisinopril (PRINIVIL,ZESTRIL) 10 MG tablet Take 10 mg by mouth every morning.     . metFORMIN (GLUCOPHAGE-XR) 500 MG 24 hr tablet Take 500 mg by mouth daily with breakfast.    . ondansetron (ZOFRAN) 4 MG tablet Take 4 mg by mouth every 8 (eight) hours as needed for nausea or vomiting.     . Oxycodone HCl 10 MG TABS Take 2 tablets (20 mg total) by mouth every 4 (four) hours as needed. Limit 1 tablet by mouth every 4 hours as needed for breakthrough pain while taking Nucynta if tolerated 60 tablet 0  . pravastatin (PRAVACHOL) 40 MG tablet Take 40 mg by mouth at bedtime.     . senna-docusate (SENOKOT-S) 8.6-50 MG tablet Take 2 tablets by mouth daily. (Patient taking differently: Take 2 tablets by mouth every morning. ) 60 tablet 0  . silver sulfADIAZINE (SILVADENE) 1 % cream Apply topically daily. (Patient taking differently: Apply 1 application topically daily. ) 50 g 0  . Tapentadol HCl (NUCYNTA ER) 150 MG TB12 Limit 1 tab by mouth  every 8 -12 hours IF tolerated   (Note pill is now 150 mg and much stronger) (Patient taking differently: Take 150 mg by mouth 2 (two) times daily. Limit 1 tab by mouth  every 8 -12 hours IF tolerated   (Note pill is now 150 mg and much stronger)) 90 tablet 0  . tiZANidine (ZANAFLEX) 2 MG tablet Limit 1 tablet by mouth 2-4 times per day if tolerated 120 tablet 0  . traZODone (DESYREL) 150 MG tablet Take 300 mg by mouth at bedtime as needed for sleep.     . Vilazodone HCl (VIIBRYD)  20 MG TABS Take 20 mg by mouth daily. Takes with 25m for total of 625mdaily    . Vilazodone HCl (VIIBRYD) 40 MG TABS Take 40 mg by mouth daily. Takes with 2042mor total of 64m50mily      Review of Systems: GENERAL:  Feels better.  No fevers, sweats or weight loss. PERFORMANCE STATUS (ECOG):  2 HEENT:  No visual changes, runny nose, sore throat, mouth sores or tenderness. Lungs: Shortness of breath with anxiety.  No cough.  No hemoptysis. Cardiac:  No chest pain, palpitations, orthopnea, or PND. GI:  Diffuse abdominal pain.  Suprapubic discomfort.  No nausea, vomiting, diarrhea, constipation, melena or hematochezia. GU:  Sensation of bladder fullness.  Voiding small amounts.  Dysuria.  No hematuria. Musculoskeletal:  Back and upper buttock pain.  No muscle tenderness. Extremities:  No pain or swelling. Skin:  No rashes or skin changes. Neuro:  No headache, numbness or weakness, balance or coordination issues. Endocrine:  No diabetes, thyroid issues, hot flashes or night sweats. Psych:  More hopeful today. Anxiety. Pain:  No focal pain. Review of systems:  All other systems reviewed and found to be negative.  Physical Exam:  Blood pressure (!) 152/76, pulse (!) 104, temperature 98.8 F (37.1 C), temperature source Oral, resp. rate 20, height 5' 5"  (1.651 m), weight 270 lb 9.6 oz (122.7 kg), SpO2 98 %.  GENERAL:  Well developed, well nourished, woman sitting comfortably on the medical unit in no acute distress. MENTAL STATUS:  Alert and oriented to person, place and time. HEAD:  Wearing a purple scarf with long dark hair protruding.  Normocephalic, atraumatic, face symmetric, no Cushingoid features. EYES:  Brown eyes with blue contacts.  Pupils equal round and reactive to light and accomodation.  No conjunctivitis or scleral icterus. ENT:  Lips dry.  Oropharynx clear without lesion.  Tongue normal. Mucous membranes moist.  RESPIRATORY:  Clear to auscultation without rales, wheezes or  rhonchi. CARDIOVASCULAR:  Regular rate and rhythm without murmur, rub or gallop. ABDOMEN:  Soft,  Diffuse mild tenderness without guarding or rebound. Active bowel sounds and no hepatosplenomegaly.  No masses. SKIN:  No rashes, ulcers or lesions. EXTREMITIES: No edema, no skin discoloration or tenderness.  No palpable cords. LYMPH NODES: No palpable cervical, supraclavicular, axillary or inguinal adenopathy  NEUROLOGICAL: Unremarkable. PSYCH:  Appropriate.   Results for orders placed or performed during the hospital encounter of 09/19/15 (from the past 48 hour(s))  CBC with Differential     Status: Abnormal   Collection Time: 09/19/15  7:37 AM  Result Value Ref Range   WBC 12.8 (H) 3.6 - 11.0 K/uL   RBC 4.02 3.80 - 5.20 MIL/uL   Hemoglobin 11.8 (L) 12.0 - 16.0 g/dL   HCT 35.0 35.0 - 47.0 %   MCV 87.2 80.0 - 100.0 fL   MCH 29.5 26.0 - 34.0 pg   MCHC 33.8 32.0 - 36.0 g/dL   RDW 13.3 11.5 - 14.5 %   Platelets 196 150 - 440 K/uL   Neutrophils Relative % 66 %   Neutro Abs 8.5 (H) 1.4 - 6.5 K/uL   Lymphocytes Relative 22 %   Lymphs Abs 2.9 1.0 - 3.6 K/uL   Monocytes Relative 9 %   Monocytes Absolute 1.2 (H) 0.2 - 0.9 K/uL   Eosinophils Relative 2 %   Eosinophils Absolute 0.2 0 - 0.7 K/uL   Basophils Relative 1 %   Basophils Absolute 0.1 0 - 0.1 K/uL  Comprehensive metabolic panel     Status: Abnormal   Collection Time: 09/19/15  7:37 AM  Result Value Ref Range   Sodium 135 135 - 145 mmol/L   Potassium 3.1 (L) 3.5 - 5.1 mmol/L   Chloride 94 (L) 101 - 111 mmol/L   CO2 31 22 - 32 mmol/L   Glucose, Bld 190 (H) 65 - 99 mg/dL   BUN 12 6 - 20 mg/dL   Creatinine, Ser 0.93 0.44 - 1.00 mg/dL   Calcium 8.6 (L)  8.9 - 10.3 mg/dL   Total Protein 8.1 6.5 - 8.1 g/dL   Albumin 3.2 (L) 3.5 - 5.0 g/dL   AST 29 15 - 41 U/L   ALT 22 14 - 54 U/L   Alkaline Phosphatase 91 38 - 126 U/L   Total Bilirubin 0.7 0.3 - 1.2 mg/dL   GFR calc non Af Amer >60 >60 mL/min   GFR calc Af Amer >60 >60 mL/min     Comment: (NOTE) The eGFR has been calculated using the CKD EPI equation. This calculation has not been validated in all clinical situations. eGFR's persistently <60 mL/min signify possible Chronic Kidney Disease.    Anion gap 10 5 - 15  Urinalysis complete, with microscopic     Status: Abnormal   Collection Time: 09/19/15  7:44 AM  Result Value Ref Range   Color, Urine YELLOW (A) YELLOW   APPearance HAZY (A) CLEAR   Glucose, UA NEGATIVE NEGATIVE mg/dL   Bilirubin Urine NEGATIVE NEGATIVE   Ketones, ur NEGATIVE NEGATIVE mg/dL   Specific Gravity, Urine 1.006 1.005 - 1.030   Hgb urine dipstick 1+ (A) NEGATIVE   pH 5.0 5.0 - 8.0   Protein, ur 100 (A) NEGATIVE mg/dL   Nitrite NEGATIVE NEGATIVE   Leukocytes, UA NEGATIVE NEGATIVE   RBC / HPF 0-5 0 - 5 RBC/hpf   WBC, UA 0-5 0 - 5 WBC/hpf   Bacteria, UA NONE SEEN NONE SEEN   Squamous Epithelial / LPF 0-5 (A) NONE SEEN   Ct Abdomen Pelvis Wo Contrast  Result Date: 09/19/2015 CLINICAL DATA:  Lower abdominal pain EXAM: CT ABDOMEN AND PELVIS WITHOUT CONTRAST TECHNIQUE: Multidetector CT imaging of the abdomen and pelvis was performed following the standard protocol without IV contrast. COMPARISON:  09/05/2005 FINDINGS: Innumerable bilateral pulmonary nodules are stable Innumerable lesions throughout the liver are stable. Spleen, pancreas, adrenal glands, gallbladder are within normal limits Bilateral hydronephrosis is stable. Markedly enlarged uterus consistent with the patient's known malignancy has grown in size. On sagittal reconstruction imaging, the uterus measures 9.7 x 16.9 cm. Previously, based on my measurements, it measured 9.0 x 14 cm. It extends into the lower abdomen. No evidence of small-bowel obstruction. Appendix is within normal limits. The rectum is somewhat difficult to visualize. There is suspected wall thickening of the upper rectum. See image 83 of series 7. This may represent spread of malignancy or possibly an inflammatory  process. Foley catheter decompresses the bladder. There is no free fluid. No vertebral compression deformity. IMPRESSION: Over the course of 2 weeks, there has been in increasing size of the uterus worrisome for worsening malignancy. Metastatic disease in the liver and lung bases is stable. There is wall thickening of the rectum representing either spread of tumor or possibly an inflammatory process. Foley catheter decompresses the bladder. Hydronephrosis persists and is likely related to compression of the ureters from the enlarged uterus. Electronically Signed   By: Marybelle Killings M.D.   On: 09/19/2015 10:09    Assessment:  The patient is a 55 y.o. 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  She is currently s/p 3 weeks of Torisel (mTOR inhibitor).  She is tolerating her treatment well.  She has mustiple admissions for abdominal pain.  Imaging suggests possible worsening disease (too early to comment on effectiveness of Torisel given limited options).  She has bilateral hydronephrosis secondary to compression by the uterus.  Creatinine is stable.  Symptomatically, her pain is currently well controlled.  Plan:   1.  Oncology:  Metastatic uterine PEComa s/p week #3 Torisel.  Plan to continue weekly therapy in outpatient department with reimaging after 6 treatments.    2.  Pain/Toxicology:  Pain under good control now with Oxycontin, oxycodone prn and Dilaudid IV prn.  Pain managed in the outpatient department by Dr. Primus Bravo in the pain clinic.  Will need to discuss adjustments in outpatient medications with him.    3.  Nephrology:  Bilateral hydronephrosis secondary to compression by uterus.  Currently renal function stable.  4.  Disposition:  Anticipate discharge home in 1 -2 days.  Thank you for allowing me to participate in Annette [name] 's care.  I will follow her closely with you until Dr. Aletha Halim return on 09/20/2015.   Lequita Asal, MD  09/19/2015,  3:55 PM

## 2015-09-19 NOTE — ED Notes (Signed)
Family at bedside. 

## 2015-09-19 NOTE — Progress Notes (Signed)
Order for enoxaparin 40 mg subcutaneously daily for DVT prophylaxis was changed to 40 mg q12h per anticoagulation protocol for CrCl > 30 mL/min and BMI >40.  Darylene Price Kyler Germer 12:30 PM

## 2015-09-19 NOTE — Progress Notes (Signed)
Patient with extremely poor venous access, we will attempt to use when necessary Dilaudid for now if unable to control symptoms will reconsider PCA

## 2015-09-20 ENCOUNTER — Other Ambulatory Visit: Payer: Self-pay | Admitting: Pain Medicine

## 2015-09-20 DIAGNOSIS — Z87891 Personal history of nicotine dependence: Secondary | ICD-10-CM

## 2015-09-20 DIAGNOSIS — J45909 Unspecified asthma, uncomplicated: Secondary | ICD-10-CM

## 2015-09-20 DIAGNOSIS — R300 Dysuria: Secondary | ICD-10-CM

## 2015-09-20 DIAGNOSIS — C548 Malignant neoplasm of overlapping sites of corpus uteri: Secondary | ICD-10-CM

## 2015-09-20 DIAGNOSIS — Z794 Long term (current) use of insulin: Secondary | ICD-10-CM

## 2015-09-20 LAB — BASIC METABOLIC PANEL
Anion gap: 9 (ref 5–15)
BUN: 11 mg/dL (ref 6–20)
CHLORIDE: 96 mmol/L — AB (ref 101–111)
CO2: 33 mmol/L — AB (ref 22–32)
CREATININE: 1.01 mg/dL — AB (ref 0.44–1.00)
Calcium: 8.5 mg/dL — ABNORMAL LOW (ref 8.9–10.3)
GFR calc Af Amer: >60 mL/min (ref >60)
GFR calc non Af Amer: >60 mL/min (ref >60)
GLUCOSE: 219 mg/dL — AB (ref 65–99)
POTASSIUM: 3 mmol/L — AB (ref 3.5–5.1)
SODIUM: 138 mmol/L (ref 135–145)

## 2015-09-20 LAB — CBC
HEMATOCRIT: 32.9 % — AB (ref 35.0–47.0)
Hemoglobin: 11.3 g/dL — ABNORMAL LOW (ref 12.0–16.0)
MCH: 29.6 pg (ref 26.0–34.0)
MCHC: 34.2 g/dL (ref 32.0–36.0)
MCV: 86.4 fL (ref 80.0–100.0)
Platelets: 197 10*3/uL (ref 150–440)
RBC: 3.81 MIL/uL (ref 3.80–5.20)
RDW: 13.6 % (ref 11.5–14.5)
WBC: 8.4 10*3/uL (ref 3.6–11.0)

## 2015-09-20 LAB — URINE CULTURE

## 2015-09-20 LAB — GLUCOSE, CAPILLARY
GLUCOSE-CAPILLARY: 331 mg/dL — AB (ref 65–99)
Glucose-Capillary: 142 mg/dL — ABNORMAL HIGH (ref 65–99)

## 2015-09-20 MED ORDER — INSULIN ASPART 100 UNIT/ML ~~LOC~~ SOLN
0.0000 [IU] | Freq: Three times a day (TID) | SUBCUTANEOUS | Status: DC
  Administered 2015-09-20: 11 [IU] via SUBCUTANEOUS
  Administered 2015-09-21 (×2): 3 [IU] via SUBCUTANEOUS
  Filled 2015-09-20: qty 3
  Filled 2015-09-20: qty 11
  Filled 2015-09-20: qty 3

## 2015-09-20 MED ORDER — POLYETHYLENE GLYCOL 3350 17 G PO PACK: 17.0000 g | PACK | Freq: Every day | ORAL | Status: DC

## 2015-09-20 MED ORDER — POLYETHYLENE GLYCOL 3350 17 G PO PACK
17.0000 g | PACK | Freq: Two times a day (BID) | ORAL | Status: DC
  Administered 2015-09-20 – 2015-09-21 (×3): 17 g via ORAL
  Filled 2015-09-20 (×2): qty 1

## 2015-09-20 MED ORDER — OXYCODONE HCL 5 MG PO TABS
20.0000 mg | ORAL_TABLET | ORAL | Status: DC | PRN
  Administered 2015-09-20 – 2015-09-21 (×7): 20 mg via ORAL
  Filled 2015-09-20 (×7): qty 4

## 2015-09-20 MED ORDER — HYDROMORPHONE HCL 1 MG/ML IJ SOLN
1.0000 mg | Freq: Once | INTRAMUSCULAR | Status: DC
  Filled 2015-09-20: qty 1

## 2015-09-20 MED ORDER — OXYCODONE HCL 5 MG PO TABS: 20.0000 mg | ORAL_TABLET | ORAL | Status: DC | PRN

## 2015-09-20 MED ORDER — POTASSIUM CHLORIDE CRYS ER 20 MEQ PO TBCR
30.0000 meq | EXTENDED_RELEASE_TABLET | Freq: Two times a day (BID) | ORAL | Status: AC
  Administered 2015-09-20 (×2): 30 meq via ORAL
  Filled 2015-09-20 (×2): qty 1

## 2015-09-20 MED ORDER — TAPENTADOL HCL ER 50 MG PO TB12
150.0000 mg | ORAL_TABLET | Freq: Two times a day (BID) | ORAL | Status: DC
  Administered 2015-09-20 – 2015-09-21 (×3): 150 mg via ORAL
  Filled 2015-09-20 (×3): qty 3

## 2015-09-20 MED ORDER — GABAPENTIN 300 MG PO CAPS
600.0000 mg | ORAL_CAPSULE | Freq: Two times a day (BID) | ORAL | Status: DC
  Administered 2015-09-20 – 2015-09-21 (×3): 600 mg via ORAL
  Filled 2015-09-20 (×3): qty 2

## 2015-09-20 MED ORDER — HYDROMORPHONE HCL 1 MG/ML IJ SOLN
1.0000 mg | Freq: Once | INTRAMUSCULAR | Status: AC
  Administered 2015-09-20: 1 mg via INTRAVENOUS
  Filled 2015-09-20: qty 1

## 2015-09-20 MED ORDER — BISACODYL 10 MG RE SUPP
10.0000 mg | Freq: Once | RECTAL | Status: AC
  Administered 2015-09-20: 10 mg via RECTAL
  Filled 2015-09-20: qty 1

## 2015-09-20 MED ORDER — ENSURE ENLIVE PO LIQD
237.0000 mL | Freq: Two times a day (BID) | ORAL | Status: DC
  Administered 2015-09-20 – 2015-09-21 (×4): 237 mL via ORAL

## 2015-09-20 NOTE — Progress Notes (Signed)
Pt had difficulty getting her pain under control this evening.  Goal is to stop IV pain medications to prepare her for home on oral pain medication.  At change of shift pt reports pain is 6/10.  It was too soon for oxy ir.  Pt reports anxiety.  Xanax given at 1923.  Pt continued to complain of pain that increased to 10/10.  Pt given oxy ir at 2022 with no relief.  Pt crying in pain.  Pt given Nucynta at 2110 but continued to complain of pain of 10/10 to lower abdomen that is cramping and constant.  Pt unable to have bm due to constipation.  Attempted to give pt one time order of Dilaudid IV but IV was leaking and pt did not receive any medication.  Ordered new dose and gave IV Dilaudid 1 mg IVP at 2146.  Pt started to have some relief but still pain of 5/10.  Pt took laxative and stool softener and then requested sleep aid.  Trazodone given at 2313 and pt relaxed in bed with warm blanket and lights and tv off. Will continue to check on pt. Dorna Bloom RN

## 2015-09-20 NOTE — Care Management Important Message (Signed)
Important Message  Patient Details  Name: Annette [name] MRN: [id number] Date of Birth: January 27, 1961   Medicare Important Message Given:  Yes    Shelbie Ammons, RN 09/20/2015, 8:44 AM

## 2015-09-20 NOTE — Progress Notes (Signed)
[facility] [place] NAME: Annette [name]    MR#:  [id number]  DATE OF BIRTH:  [birth year]  SUBJECTIVE:  Came in with increasing abdominal pain for last couple days. This is patient's third admission since July 2017. She follows up at the pain clinic with Dr. crisp. No vomiting. Last bowel movement 2 days ago.  REVIEW OF SYSTEMS:   Review of Systems  Constitutional: Negative for chills, fever and weight loss.  HENT: Negative for ear discharge, ear pain and nosebleeds.   Eyes: Negative for blurred vision, pain and discharge.  Respiratory: Negative for sputum production, shortness of breath, wheezing and stridor.   Cardiovascular: Negative for chest pain, palpitations, orthopnea and PND.  Gastrointestinal: Positive for abdominal pain. Negative for diarrhea, nausea and vomiting.  Genitourinary: Negative for frequency and urgency.  Musculoskeletal: Negative for back pain and joint pain.  Neurological: Positive for weakness. Negative for sensory change, speech change and focal weakness.  Psychiatric/Behavioral: Negative for depression and hallucinations. The patient is not nervous/anxious.    Tolerating Diet:yes Tolerating PT: pedning  DRUG ALLERGIES:   Allergies  Allergen Reactions  . Iodides Swelling  . Iodine Swelling  . Shellfish Allergy Swelling    shrimp causes swelling  . Shellfish-Derived Products Swelling  . Iodinated Diagnostic Agents Swelling  . Povidone Iodine Swelling    eyes  . Povidone-Iodine Swelling    VITALS:  Blood pressure 127/76, pulse (!) 102, temperature 98.8 F (37.1 C), temperature source Oral, resp. rate 20, height 5\' 5"  (1.651 m), weight 270 lb 9.6 oz (122.7 kg), SpO2 93 %.  PHYSICAL EXAMINATION:   Physical Exam  GENERAL:  55 y.o.-year-old patient lying in the bed with no acute distress. obese EYES: Pupils equal, round, reactive to light and accommodation. No scleral icterus. Extraocular muscles  intact.  HEENT: Head atraumatic, normocephalic. Oropharynx and nasopharynx clear.  NECK:  Supple, no jugular venous distention. No thyroid enlargement, no tenderness.  LUNGS: Normal breath sounds bilaterally, no wheezing, rales, rhonchi. No use of accessory muscles of respiration.  CARDIOVASCULAR: S1, S2 normal. No murmurs, rubs, or gallops.  ABDOMEN: Soft, nontender, nondistended. Bowel sounds present. No organomegaly or mass.  EXTREMITIES: No cyanosis, clubbing or edema b/l.    NEUROLOGIC: Cranial nerves II through XII are intact. No focal Motor or sensory deficits b/l.   PSYCHIATRIC:  patient is alert and oriented x 3.  SKIN: No obvious rash, lesion, or ulcer.   LABORATORY PANEL:  CBC  Recent Labs Lab 09/20/15 0516  WBC 8.4  HGB 11.3*  HCT 32.9*  PLT 197    Chemistries   Recent Labs Lab 09/19/15 0737 09/20/15 0516  NA 135 138  K 3.1* 3.0*  CL 94* 96*  CO2 31 33*  GLUCOSE 190* 219*  BUN 12 11  CREATININE 0.93 1.01*  CALCIUM 8.6* 8.5*  AST 29  --   ALT 22  --   ALKPHOS 91  --   BILITOT 0.7  --    Cardiac Enzymes No results for input(s): TROPONINI in the last 168 hours. RADIOLOGY:  Ct Abdomen Pelvis Wo Contrast  Result Date: 09/19/2015 CLINICAL DATA:  Lower abdominal pain EXAM: CT ABDOMEN AND PELVIS WITHOUT CONTRAST TECHNIQUE: Multidetector CT imaging of the abdomen and pelvis was performed following the standard protocol without IV contrast. COMPARISON:  09/05/2005 FINDINGS: Innumerable bilateral pulmonary nodules are stable Innumerable lesions throughout the liver are stable. Spleen, pancreas, adrenal glands, gallbladder are within normal limits Bilateral hydronephrosis is  stable. Markedly enlarged uterus consistent with the patient's known malignancy has grown in size. On sagittal reconstruction imaging, the uterus measures 9.7 x 16.9 cm. Previously, based on my measurements, it measured 9.0 x 14 cm. It extends into the lower abdomen. No evidence of small-bowel  obstruction. Appendix is within normal limits. The rectum is somewhat difficult to visualize. There is suspected wall thickening of the upper rectum. See image 83 of series 7. This may represent spread of malignancy or possibly an inflammatory process. Foley catheter decompresses the bladder. There is no free fluid. No vertebral compression deformity. IMPRESSION: Over the course of 2 weeks, there has been in increasing size of the uterus worrisome for worsening malignancy. Metastatic disease in the liver and lung bases is stable. There is wall thickening of the rectum representing either spread of tumor or possibly an inflammatory process. Foley catheter decompresses the bladder. Hydronephrosis persists and is likely related to compression of the ureters from the enlarged uterus. Electronically Signed   By: Marybelle Killings M.D.   On: 09/19/2015 10:09   ASSESSMENT AND PLAN:  55 year old African-American female with known widely metastatic uterine cancer who is presenting for intractable pain. At the pleasure of meeting this unfortunate lady on last admission, her pain was well-controlled on discharge however she has progressed, with evidence of disease progression on imaging over the last few weeks.  1. Intractable pain secondary to widely metastatic uterine cancer: - At home takes Nucynta 150 mg by mouth twice a day -I spoke with Dr. crisp who recommends continue her Nucynta ER 150 mg twice a day and oxycodone immediate release 20 mg every 3 hours when necessary. He recommends DC IV Dilaudid.  -Continue with bowel regimen  2. Bladder spasm: Oxybutynin, however this is likely related to mass effect from cancer -No evidence of UTI 3. Hyperlipidemia unspecified: Statin therapy  4. Essential hypertension: Lisinopril,  Hydrochlorothiazide  5. Type 2 diabetes non-insulin-requiring: on oral agents at insulin sliding scale coverage  Discussed at length with patient and patient's daughter was present in  the room.   Case discussed with Care Management/Social Worker. Management plans discussed with the patient, family and they are in agreement.  CODE STATUS:Full   DVT Prophylaxis:Lovenox  Total TIME TAKING CARE OF THIS PATIENT:30es.  >50% time spent on counselling and coordination of care  POSSIBLE D/C IN1-2YS, DEPENDING ON CLINICAL CONDITION.  Note: This dictation was prepared with Dragon dictation along with smaller phrase technology. Any transcriptional errors that result from this process are unintentional.  Climmie Cronce M.D on 09/20/2015 at 12:34 PM  Between 7am to 6pm - Pager - [phone]  After 6pm go to [email or web address] - password [email or web address] [facility]  Office  [phone]  CC: Primary care physician; Ellamae Sia, MD

## 2015-09-20 NOTE — Progress Notes (Signed)
Inpatient Diabetes Program Recommendations  AACE/ADA: New Consensus Statement on Inpatient Glycemic Control (2015)  Target Ranges:  Prepandial:   less than 140 mg/dL      Peak postprandial:   less than 180 mg/dL (1-2 hours)      Critically ill patients:  140 - 180 mg/dL   Results for Annette [name], Annette [name] (MRN [id number]) as of 09/20/2015 11:38  Ref. Range 09/19/2015 07:37 09/20/2015 05:16  Glucose Latest Ref Range: 65 - 99 mg/dL 190 (H) 219 (H)   Review of Glycemic Control  Diabetes history: DM2 Outpatient Diabetes medications: Metformin XR 500 mg QAM Current orders for Inpatient glycemic control: None  Inpatient Diabetes Program Recommendations: Correction (SSI): While inpatient, please consider ordering CBGs with Novolog correction scale. Diet: Please consider changing diet from Regular to CARB MODIFIED.  Thanks, Barnie Alderman, RN, MSN, CDE Diabetes Coordinator Inpatient Diabetes Program [phone] (Team Pager from [place] to [address]) [phone] ([facility] office) [phone] [facility] office) [phone] [facility] office)

## 2015-09-20 NOTE — Care Management (Signed)
Admitted to [facility] with the diagnosis of intractable pain. Lives alone.Daughter at the bedside. Contact person is Donne Anon [phone]). He is her nursing assistant x 20 years, works for [employer] x 1 in the last year. Appetite better since being in the hospital. Goes to the pain clinic. Dr. Fritzi Mandes spoke with Dr. Primus Bravo. He will be coming by at lunch to see Ms. Forrester. Sees Dr. Kasandra Knudsen for psychiatric needs., last appointment was a month ago. States she has an appointment coming up. Self feed, dress, and dress. Family and assistant helps with errands. Uses no aids for ambulation. Prescriptions are filled at [facility].  Uterine., liver, and lung cancer followed at [facility]. Family or assistant will transport. Shelbie Ammons RN MSN CCM Care Management [phone]

## 2015-09-20 NOTE — Progress Notes (Signed)
Initial Nutrition Assessment  DOCUMENTATION CODES:   Morbid obesity  INTERVENTION:   -Ensure Enlive po BID, each supplement provides 350 kcal and 20 grams of protein  NUTRITION DIAGNOSIS:   Inadequate oral intake related to poor appetite as evidenced by meal completion < 25%, percent weight loss.  GOAL:   Patient will meet greater than or equal to 90% of their needs  MONITOR:   PO intake, Supplement acceptance, Labs, Weight trends, Skin, I & O's  REASON FOR ASSESSMENT:   Malnutrition Screening Tool    ASSESSMENT:   55 year old African-American female with known widely metastatic uterine cancer who is presenting for intractable pain.  Pt admitted with pain related to uterine cancer.   Hx obtained from pt and close family friend at bedside. Pt reports a general decline over the past 6 weeks. She endorses poor appetite and weakness related to pain and possible depression. Per pt, she has been very sedentary over the past few weeks. Intake has been minimal; pt friend confirms that pt's frequently consumed foods include canned fruit, smoothies, and Special K shakes. She reports her appetite has improved since being in the hospital- pt consumed a piece of toast and jelly this morning for breakfast. Meal completion 75-100%.   Pt UBW is around 280#. Pet wt hx, pt has experienced a 5.3% wt loss over the past month, which is significant for time frame.   Nutrition-Focused physical exam completed. Findings are no fat depletion, no muscle depletion, and mild edema.   Pt inquired about protein shakes to assist in optimizing nutritional intake; she is amenable to try Ensure. Also discussed ways she could maximize protein intake at home and add protein to fruit smoothies that pt was preparing at home PTA. Pt and friend expressed appreciation for information given and for visit.   Palliative care consult pending.  Labs and medications reviewed: K: 3.0.  Diet Order:  Diet regular Room  service appropriate? Yes; Fluid consistency: Thin  Skin:  Wound (see comment) (burn to rt lateral hip)  Last BM:  09/18/15  Height:   Ht Readings from Last 1 Encounters:  09/19/15 5\' 5"  (1.651 m)    Weight:   Wt Readings from Last 1 Encounters:  09/19/15 270 lb 9.6 oz (122.7 kg)    Ideal Body Weight:  56.8 kg  BMI:  Body mass index is 45.03 kg/m.  Estimated Nutritional Needs:   Kcal:  1700-1900  Protein:  100-115 grams  Fluid:  >1.7 L  EDUCATION NEEDS:   Education needs addressed  Kimberlin Scheel A. Jimmye Norman, RD, LDN, CDE Pager: [phone] After hours Pager: [phone]

## 2015-09-20 NOTE — Progress Notes (Signed)
[name] CONSULT NOTE  Patient Care Team: Ellamae Sia, MD as PCP - General (Internal Medicine) Clent Jacks, RN as Registered Nurse  CHIEF COMPLAINTS/PURPOSE OF CONSULTATION:  Oncology History   # July 2017 UTERINE PECOMA s/p Liver bx [Dr.Berhcuk]; Uterine mass; liver lesion;  lung nodules;  # July 28th 2017- TORISEL weekly  # 2002 & 2004 hip Surgery/chronic pain [Dr.Crsip]/Right foot drop- limited mobility/Obese    # [facility]- [facility]SC-2 pending     Malignant neoplasm of overlapping sites of body of uterus ([place])   08/18/2015 Initial Diagnosis    Malignant neoplasm of overlapping sites of body of uterus ([place])     CC:  Continues to complain of back pain; also complains of pain in the abdomen. Possible constipation. Difficulty with urination. However her pain is better controlled this evening.  ROS: No fevers. No chills.   MEDICAL HISTORY:  Past Medical History:  Diagnosis Date  . Asthma   . Back pain   . Cancer ([place])   . DDD (degenerative disc disease), lumbar   . Depression   . Diabetes mellitus without complication ([place])   . Hyperlipidemia   . Hypertension   . Insomnia   . Menopausal syndrome   . Obesity   . Onychomycosis   . Peripheral neuropathy ([place])   . Renal insufficiency     SURGICAL HISTORY: Past Surgical History:  Procedure Laterality Date  . COSMETIC SURGERY    . JOINT REPLACEMENT Bilateral [id number]  . LIVER BIOPSY    . REDUCTION MAMMAPLASTY Bilateral 12/15/2013  . SHOULDER SURGERY Left     SOCIAL HISTORY: [place] Heal; live by self; smoking; no alcohol. She has children/family support. Social History   Social History  . Marital status: Single    Spouse name: N/A  . Number of children: N/A  . Years of education: N/A   Occupational History  . Not on file.   Social History Main Topics  . Smoking status: Former Smoker    Quit date: 08/19/2015  . Smokeless tobacco: Never Used  . Alcohol use No  . Drug use: No  .  Sexual activity: Not on file   Other Topics Concern  . Not on file   Social History Narrative  . No narrative on file    FAMILY HISTORY: Family History  Problem Relation Age of Onset  . COPD Mother   . Sleep apnea Mother   . Alcohol abuse Mother   . Depression Mother   . Stroke Mother   . Heart disease Father   . Heart disease Sister     ALLERGIES:  is allergic to iodides; iodine; shellfish allergy; shellfish-derived products; iodinated diagnostic agents; povidone iodine; and povidone-iodine.  MEDICATIONS:  Current Facility-Administered Medications  Medication Dose Route Frequency Provider Last Rate Last Dose  . acetaminophen (TYLENOL) tablet 650 mg  650 mg Oral Q6H PRN Lytle Butte, MD       Or  . acetaminophen (TYLENOL) suppository 650 mg  650 mg Rectal Q6H PRN Lytle Butte, MD      . albuterol (PROVENTIL) (2.5 MG/3ML) 0.083% nebulizer solution 2.5 mg  2.5 mg Nebulization Q6H PRN Lenis Noon, [facility]      . ALPRAZolam Duanne Moron) tablet 2 mg  2 mg Oral BID PRN Lytle Butte, MD   2 mg at 09/20/15 1923  . aspirin EC tablet 81 mg  81 mg Oral BH-q7a Lytle Butte, MD   81 mg at 09/20/15 0901  . bisacodyl (  DULCOLAX) suppository 10 mg  10 mg Rectal Daily PRN Lytle Butte, MD      . budesonide (PULMICORT) nebulizer solution 0.25 mg  0.25 mg Nebulization BID Lenis Noon, RPH   0.25 mg at 09/20/15 1931  . buPROPion [facility] SR) 12 hr tablet 150 mg  150 mg Oral Daily Lytle Butte, MD   150 mg at 09/20/15 0901  . docusate sodium (COLACE) capsule 100 mg  100 mg Oral BID Lytle Butte, MD   100 mg at 09/20/15 0901  . enoxaparin (LOVENOX) injection 40 mg  40 mg Subcutaneous Q12H Lytle Butte, MD   40 mg at 09/20/15 0902  . feeding supplement (ENSURE ENLIVE) (ENSURE ENLIVE) liquid 237 mL  237 mL Oral BID BM Fritzi Mandes, MD   237 mL at 09/20/15 1426  . gabapentin (NEURONTIN) capsule 600 mg  600 mg Oral BID Lytle Butte, MD   600 mg at 09/20/15 0902  . insulin aspart (novoLOG) injection  0-15 Units  0-15 Units Subcutaneous TID WC Fritzi Mandes, MD   11 Units at 09/20/15 1702  . lactulose (CHRONULAC) 10 GM/15ML solution 30 g  30 g Oral BID PRN Lytle Butte, MD      . lisinopril (PRINIVIL,ZESTRIL) tablet 10 mg  10 mg Oral BH-q7a Lytle Butte, MD   10 mg at 09/20/15 1425  . ondansetron (ZOFRAN) tablet 4 mg  4 mg Oral Q6H PRN Lytle Butte, MD       Or  . ondansetron [facility]) injection 4 mg  4 mg Intravenous Q6H PRN Lytle Butte, MD      . oxybutynin (DITROPAN) tablet 5 mg  5 mg Oral TID Lytle Butte, MD   5 mg at 09/20/15 1702  . oxyCODONE (Oxy IR/ROXICODONE) immediate release tablet 20 mg  20 mg Oral Q3H PRN Fritzi Mandes, MD   20 mg at 09/20/15 1748  . polyethylene glycol (MIRALAX / GLYCOLAX) packet 17 g  17 g Oral Daily PRN Lytle Butte, MD   17 g at 09/19/15 1436  . polyethylene glycol (MIRALAX / GLYCOLAX) packet 17 g  17 g Oral BID Fritzi Mandes, MD   17 g at 09/20/15 1257  . potassium chloride (K-DUR,KLOR-CON) CR tablet 30 mEq  30 mEq Oral BID Fritzi Mandes, MD   30 mEq at 09/20/15 1425  . pravastatin (PRAVACHOL) tablet 40 mg  40 mg Oral QHS Lytle Butte, MD   40 mg at 09/19/15 2202  . senna-docusate (Senokot-S) tablet 2 tablet  2 tablet Oral Daily Lytle Butte, MD   2 tablet at 09/20/15 0901  . silver sulfADIAZINE (SILVADENE) 1 % cream   Topical Daily Lytle Butte, MD      . sodium chloride flush (NS) 0.9 % injection 3 mL  3 mL Intravenous PRN Lytle Butte, MD   3 mL at 09/19/15 1733  . tamsulosin (FLOMAX) capsule 0.4 mg  0.4 mg Oral Daily Lytle Butte, MD   0.4 mg at 09/20/15 0902  . tapentadol (NUCYNTA) 12 hr tablet 150 mg  150 mg Oral Q12H Fritzi Mandes, MD   150 mg at 09/20/15 1537  . tiZANidine (ZANAFLEX) tablet 2 mg  2 mg Oral Q6H PRN Lytle Butte, MD      . traZODone (DESYREL) tablet 300 mg  300 mg Oral QHS PRN Lytle Butte, MD   300 mg at 09/19/15 2202  . Vilazodone HCl TABS 60 mg  60  mg Oral Daily Lytle Butte, MD   60 mg at 09/20/15 [id number]      .  PHYSICAL  EXAMINATION: ECOG PERFORMANCE STATUS: 2 - Symptomatic, <50% confined to bed  Vitals:   09/20/15 0529 09/20/15 1249  BP: 127/76 140/71  Pulse: (!) 102 100  Resp: 20 20  Temp: 98.8 F (37.1 C) 99 F (37.2 C)   Filed Weights   09/19/15 0721 09/19/15 1318  Weight: 270 lb (122.5 kg) 270 lb 9.6 oz (122.7 kg)    GENERAL: Well-nourished well-developed; Alert, no distress and comfortable. Accompanied by sister. She is walking by herself; Obese.  EYES: no pallor or icterus OROPHARYNX: no thrush or ulceration;  NECK: supple, no masses felt LYMPH:  no palpable lymphadenopathy in the cervical, axillary or inguinal regions LUNGS: clear to auscultation and  No wheeze or crackles HEART/CVS: regular rate & rhythm and no murmurs; No lower extremity edema ABDOMEN: abdomen soft, non-tender and normal bowel sounds Musculoskeletal:no cyanosis of digits and no clubbing  PSYCH: drowsy-but easily arousable & oriented x 3 NEURO: no focal motor/sensory deficits SKIN:  no rashes or significant lesions  LABORATORY DATA:  I have reviewed the data as listed Lab Results  Component Value Date   WBC 8.4 09/20/2015   HGB 11.3 (L) 09/20/2015   HCT 32.9 (L) 09/20/2015   MCV 86.4 09/20/2015   PLT 197 09/20/2015    Recent Labs  09/06/15 1846  09/17/15 0858 09/19/15 0737 09/20/15 0516  NA 135  < > 132* 135 138  K 3.3*  < > 3.5 3.1* 3.0*  CL 94*  < > 90* 94* 96*  CO2 28  < > 30 31 33*  GLUCOSE 195*  < > 235* 190* 219*  BUN 13  < > 12 12 11   CREATININE 0.96  < > 1.09* 0.93 1.01*  CALCIUM 9.2  < > 8.5* 8.6* 8.5*  GFRNONAA >60  < > 56* >60 >60  GFRAA >60  < > >60 >60 >60  PROT 8.7*  --  8.6* 8.1  --   ALBUMIN 4.0  --  3.4* 3.2*  --   AST 33  --  30 29  --   ALT 23  --  20 22  --   ALKPHOS 88  --  100 91  --   BILITOT 0.8  --  0.3 0.7  --   < > = values in this interval not displayed.  RADIOGRAPHIC STUDIES: I have personally reviewed the radiological images as listed and agreed with the findings  in the report. Ct Abdomen Pelvis Wo Contrast  Result Date: 09/19/2015 CLINICAL DATA:  Lower abdominal pain EXAM: CT ABDOMEN AND PELVIS WITHOUT CONTRAST TECHNIQUE: Multidetector CT imaging of the abdomen and pelvis was performed following the standard protocol without IV contrast. COMPARISON:  09/05/2005 FINDINGS: Innumerable bilateral pulmonary nodules are stable Innumerable lesions throughout the liver are stable. Spleen, pancreas, adrenal glands, gallbladder are within normal limits Bilateral hydronephrosis is stable. Markedly enlarged uterus consistent with the patient's known malignancy has grown in size. On sagittal reconstruction imaging, the uterus measures 9.7 x 16.9 cm. Previously, based on my measurements, it measured 9.0 x 14 cm. It extends into the lower abdomen. No evidence of small-bowel obstruction. Appendix is within normal limits. The rectum is somewhat difficult to visualize. There is suspected wall thickening of the upper rectum. See image 83 of series 7. This may represent spread of malignancy or possibly an inflammatory process. Foley catheter decompresses the bladder. There  is no free fluid. No vertebral compression deformity. IMPRESSION: Over the course of 2 weeks, there has been in increasing size of the uterus worrisome for worsening malignancy. Metastatic disease in the liver and lung bases is stable. There is wall thickening of the rectum representing either spread of tumor or possibly an inflammatory process. Foley catheter decompresses the bladder. Hydronephrosis persists and is likely related to compression of the ureters from the enlarged uterus. Electronically Signed   By: Marybelle Killings M.D.   On: 09/19/2015 10:09   Ct Abdomen Pelvis Wo Contrast  Result Date: 09/06/2015 CLINICAL DATA:  Lower abdominal pain about the mid umbilicus for 3 days, worsening. History of metastatic uterine carcinoma. EXAM: CT ABDOMEN AND PELVIS WITHOUT CONTRAST TECHNIQUE: Multidetector CT imaging of the  abdomen and pelvis was performed following the standard protocol without IV contrast. COMPARISON:  CT abdomen and pelvis 08/23/2015 and 07/23/2014. FINDINGS: Innumerable pulmonary nodules are seen in the lower lobes bilaterally consistent with metastatic disease. Largest nodule in the right lower lobe measures 1.6 cm, unchanged. A nodule in the left lower lobe which had measured 1.0 cm in diameter today measures 1.5 cm on image 8. Several small new lesions are identified. No pleural or pericardial effusion. Innumerable hepatic metastases are identified as on the prior examinations. Index lesion in the right hepatic lobe measuring 3.1 cm on image 19 is unchanged since the most recent study. A second index lesion near the dome of the liver on image 9 measures 1.6 cm compared to 1.0 cm on the prior exam. The spleen, adrenal glands and pancreas appear normal. Since the prior study, the patient has developed moderate bilateral hydronephrosis, worse on the left. Cause for the obstruction is the patient's markedly enlarged uterus consistent with history of carcinoma. The uterus today measures 13.5 x 13.2 cm in the axial plane on image 72 compared to 11.5 x 12.7 cm on the prior examination. There is mass effect on the urinary bladder from the uterus. Calcified fibroids are noted. No lymphadenopathy is identified. Previously seen soft tissue density along the posterior aspect of the uterus on the left can no longer be separately identified. No evidence of bowel obstruction is seen. The rectosigmoid colon is difficult to visualize due to the patient's uterine mass. No fluid collection is identified. No lytic or sclerotic bony lesion is seen. IMPRESSION: Progressive metastatic uterine carcinoma with increase in the size and number of pulmonary nodules and increase the size of the hepatic metastases. Moderate bilateral hydronephrosis due to obstruction from the patient's uterine mass is new since the CT 2 weeks ago.  Electronically Signed   By: Inge Rise M.D.   On: 09/06/2015 21:28   Ct Abdomen Pelvis Wo Contrast  Result Date: 08/23/2015 CLINICAL DATA:  Upper and lower abdominal pain. Recently diagnosed uterine cancer. EXAM: CT ABDOMEN AND PELVIS WITHOUT CONTRAST TECHNIQUE: Multidetector CT imaging of the abdomen and pelvis was performed following the standard protocol without IV contrast. COMPARISON:  07/23/2015 FINDINGS: Lower chest: Numerous nodules are again seen in the visualized lung bases and have increased in size, with the largest measuring 18 mm in the right lower lobe (series 4, image 18, previously 13 mm). There is no pleural effusion. Hepatobiliary: The numerous hypoattenuating liver lesions which have increased in size and number from the prior study, the largest measuring 3.1 cm in segment IV (series 2, image 18, previously 1.7 cm). Gallbladder is unremarkable. No biliary dilatation. Pancreas: Unremarkable. Spleen: Unremarkable. Adrenals/Urinary Tract: Unremarkable adrenal glands and right  kidney. Slight fullness of the left renal collecting system. No significant ureteral dilatation. Bladder is decompressed. Stomach/Bowel: Oral contrast is present in the stomach and multiple loops of nondilated proximal to mid small bowel. There is no evidence of bowel obstruction. The appendix is unremarkable. Vascular/Lymphatic: Normal caliber of the abdominal aorta. Rounded soft tissue posteriorly in the left aspect of the pelvis measures 3.2 x 3.1 cm (series 2, image 73, previously 2.3 x 3.0 cm). Reproductive: Suspected calcified fibroid in the posterior superior uterine segment is unchanged. Masslike enlargement of the uterus more inferiorly is grossly similar to the prior study, with assessment partially limited by streak artifact from bilateral hip arthroplasties. Other: No free fluid. Musculoskeletal: Bilateral hip arthroplasties. No suspicious lytic or blastic osseous lesion identified. IMPRESSION: 1. Interval  progression of lung and liver metastases. 2. Enlargement of left pelvic nodal metastasis/ peritoneal implant. 3. Grossly similar appearance of infiltrative lower uterine mass. Electronically Signed   By: Logan Bores M.D.   On: 08/23/2015 20:40   Dg Chest 2 View  Result Date: 08/23/2015 CLINICAL DATA:  Pain under right breast and in right arm with shortness of breath beginning this morning. EXAM: CHEST  2 VIEW COMPARISON:  Chest x-ray dated 12/07/2011. FINDINGS: Heart size is normal. Atherosclerotic changes noted at the aortic arch. Ill-defined nodularity is seen within the right lower lung, largest within the lateral aspects of the right lower lung. Left lung is relatively clear. No pleural effusion or pneumothorax seen. Osseous structures about the chest are unremarkable. IMPRESSION: Probable small pulmonary nodules within the right lower lung, perhaps with surrounding interstitial edema. Recommend chest CT for further characterization. Aortic atherosclerosis. Electronically Signed   By: Franki Cabot M.D.   On: 08/23/2015 18:56   Ct Head Wo Contrast  Result Date: 08/30/2015 CLINICAL DATA:  Periods of confusion for the past 2 days. History of metastatic cancer. EXAM: CT HEAD WITHOUT CONTRAST TECHNIQUE: Contiguous axial images were obtained from the base of the skull through the vertex without intravenous contrast. COMPARISON:  02/28/2013; 11/10/2006 FINDINGS: Brain: Gray-white differentiation is maintained. No CT evidence of acute large territory infarct. No intraparenchymal extra-axial mass or hemorrhage. Normal size and configuration the ventricles and basilar cisterns. No midline shift. Vascular: No hyperdense vessel or unexpected calcification. Skull: No displaced calvarial fracture. Note is made of mild hyperostosis frontalis. Sinuses/Orbits: There is underpneumatization of the right frontal sinus. The remaining paranasal sinuses and mastoid air cells are normally aerated. Air-fluid levels. Other:  Regional soft tissues appear normal. IMPRESSION: Negative noncontrast head CT. Electronically Signed   By: Sandi Mariscal M.D.   On: 08/30/2015 22:14  Ct Chest Wo Contrast  Result Date: 08/25/2015 CLINICAL DATA:  Pulmonary nodules by chest x-ray 08/23/2015. History of metastatic uterine cancer. EXAM: CT CHEST WITHOUT CONTRAST TECHNIQUE: Multidetector CT imaging of the chest was performed following the standard protocol without IV contrast. COMPARISON:  08/23/2015, 07/23/2015, 01/30/2008 FINDINGS: Mediastinum/Lymph Nodes: Atherosclerosis of the thoracic aorta noted. No definite mediastinal or hilar adenopathy within the limits of noncontrast imaging. Normal heart size. No pericardial effusion. Lungs/Pleura: Numerous bilateral new pulmonary nodules throughout all lobes of both lungs. These are new compared to 01/30/2008 chest CT. Nodules are compatible with diffuse pulmonary metastases. Index peripheral right lower lobe pulmonary nodule measures 16 mm, image 80. Index left upper lobe pulmonary nodule measures 10 mm, image 33. No superimposed edema, collapse or consolidation. No focal pneumonia or airspace process. No pleural abnormality, effusion, or pneumothorax. Trachea and central airways remain patent. Upper abdomen:  Numerous hypodense lesions throughout the imaged portion of the liver compatible with extensive hepatic metastases. No biliary dilatation. Gallbladder is collapsed. No upper abdominal adenopathy. Musculoskeletal: Minor thoracic degenerative change. No acute compression fracture. Thoracic spine and sternum appear intact. IMPRESSION: Innumerable bilateral pulmonary metastases involving all lobes of both lungs. Evidence of extensive diffuse hepatic metastases as well without biliary obstruction or dilatation. Thoracic aortic atherosclerosis Electronically Signed   By: Jerilynn Mages.  Shick M.D.   On: 08/25/2015 14:42   Nm Pulmonary Perf And Vent  Result Date: 08/23/2015 CLINICAL DATA:  Pain. EXAM: NUCLEAR  MEDICINE VENTILATION - PERFUSION LUNG SCAN TECHNIQUE: Ventilation images were obtained in multiple projections using inhaled aerosol Tc-4m DTPA. Perfusion images were obtained in multiple projections after intravenous injection of Tc-13m MAA. RADIOPHARMACEUTICALS:  32.1 mCi Technetium-98m DTPA aerosol inhalation and 3.88 mCi Technetium-52m MAA IV COMPARISON:  Chest x-ray from earlier today FINDINGS: Ventilation: No focal ventilation defect. Perfusion: No wedge shaped peripheral perfusion defects to suggest acute pulmonary embolism. IMPRESSION: No evidence of pulmonary emboli.  Very low probability V/Q scan. Electronically Signed   By: Dorise Bullion III M.D   On: 08/23/2015 23:55   Dg Abd Acute W/chest  Result Date: 08/30/2015 CLINICAL DATA:  Low abdominal pain with weakness and confusion for 2 days. Metastatic uterine cancer with known bilateral pulmonary nodules. EXAM: DG ABDOMEN ACUTE W/ 1V CHEST COMPARISON:  Chest CT 08/25/2015.  Radiographs 08/23/2015. FINDINGS: The heart size and mediastinal contours are stable without definite adenopathy. There is aortic atherosclerosis. Innumerable pulmonary nodules are present in both lungs. There is no airspace disease, pleural effusion or pneumothorax. Postsurgical changes are noted status post lower cervical fusion. The bowel gas pattern is nonobstructive. There is no free intraperitoneal air or suspicious abdominal calcification. Patient is status post bilateral total hip arthroplasties. There are no acute or suspicious osseous findings. IMPRESSION: 1. No acute findings or explanation for the patient's symptoms. 2. Widespread pulmonary metastatic disease as demonstrated on recent prior studies. Electronically Signed   By: Richardean Sale M.D.   On: 08/30/2015 19:16   ASSESSMENT & PLAN:   # Metastatic uterine malignancy to the liver/lungs.  # Progressive pain- likely secondary to malignancy progression in the pelvis-however too early to assess response.  Agree with the current pain regimen- switching to oral pain medication.  # PECOMA metastatic to the liver and lung- repeat CT scan shows stable liver and lung lesions; progressive uterine mass- status post 3 treatments of Torisel. Continue current therapy- outpatient.   # If patient's pain is controlled overnight- she could be discharged home/ followed up as outpatient   . Cammie Sickle, MD 09/20/2015 7:53 PM

## 2015-09-21 ENCOUNTER — Ambulatory Visit: Payer: Self-pay | Admitting: Urology

## 2015-09-21 LAB — GLUCOSE, CAPILLARY
GLUCOSE-CAPILLARY: 177 mg/dL — AB (ref 65–99)
Glucose-Capillary: 196 mg/dL — ABNORMAL HIGH (ref 65–99)

## 2015-09-21 LAB — MAGNESIUM: MAGNESIUM: 2 mg/dL (ref 1.7–2.4)

## 2015-09-21 MED ORDER — POLYETHYLENE GLYCOL 3350 17 G PO PACK: 17.0000 g | PACK | Freq: Two times a day (BID) | ORAL | 0 refills | Status: AC

## 2015-09-21 MED ORDER — OXYCODONE HCL 10 MG PO TABS: 20.0000 mg | ORAL_TABLET | ORAL | 0 refills | Status: DC | PRN

## 2015-09-21 NOTE — Discharge Summary (Signed)
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 Asthma   . Back pain   . Cancer ([place])   . DDD (degenerative disc disease), lumbar   . Depression   . Diabetes mellitus without complication ([place])   . Hyperlipidemia   . Hypertension   . Insomnia   . Menopausal syndrome   . Obesity   . Onychomycosis   . Peripheral neuropathy ([place])   . Renal insufficiency     HOSPITAL COURSE:   55 year old African-American female with known widely metastatic uterine cancer who is presenting for intractable pain. At the pleasure of meeting this unfortunate lady on last admission, her pain was well-controlled on discharge however she has progressed, with evidence of disease progression on imaging over the last few weeks.  1. Intractable pain secondary to widely metastatic uterine cancer: - At home takes Nucynta 150 mg by mouth twice a day -I spoke with Dr. crisp who recommends continue her Nucynta ER 150 mg twice a day and oxycodone immediate release 20 mg every 3 hours when necessary. He recommends DC IV Dilaudid.  -Continue with bowel regimen  2. Bladder spasm: Oxybutynin, however this is likely related to mass effect from cancer -No evidence of UTI  3. Hyperlipidemia unspecified: Statin therapy  4. Essential hypertension: Lisinopril,  Hydrochlorothiazide  5. Type 2 diabetes non-insulin-requiring: on oral agents at insulin  sliding scale coverage  D/w pt and son in the room Pt is agreeable for d/c home Dr Patrecia Pace informed. D/c home  CONSULTS OBTAINED:  Treatment Team:  Lytle Butte, MD Lequita Asal, MD  DRUG ALLERGIES:   Allergies  Allergen Reactions  . Iodides Swelling  . Iodine Swelling  . Shellfish Allergy Swelling    shrimp causes swelling  . Shellfish-Derived Products Swelling  . Iodinated Diagnostic Agents Swelling  . Povidone Iodine Swelling    eyes  . Povidone-Iodine Swelling    DISCHARGE MEDICATIONS:   Current Discharge Medication List    START taking these medications   Details  polyethylene glycol (MIRALAX / GLYCOLAX) packet Take 17 g by mouth 2 (two) times daily. Qty: 14 each, Refills: 0      CONTINUE these medications which have CHANGED   Details  Oxycodone HCl 10 MG TABS Take 2 tablets (20 mg total) by mouth every 3 (three) hours as needed. Limit 1 tablet by mouth every 4 hours as needed for breakthrough pain while taking Nucynta if tolerated Qty: 60 tablet, Refills: 0      CONTINUE these medications which have NOT CHANGED   Details  albuterol (PROAIR HFA) 108 (90 Base) MCG/ACT inhaler Inhale 1-2 puffs into the lungs every 6 (six) hours as needed for wheezing.     alprazolam (XANAX) 2 MG tablet Take 2 mg by mouth 2 (two) times daily as needed for anxiety.     aspirin EC 81 MG tablet Take 81 mg by mouth every morning.     beclomethasone (  QVAR) 40 MCG/ACT inhaler Inhale 2 puffs into the lungs 2 (two) times daily. Reported on 05/26/2015    buPROPion (WELLBUTRIN SR) 150 MG 12 hr tablet Take 150 mg by mouth daily.    docusate sodium (COLACE) 100 MG capsule Take 100 mg by mouth 2 (two) times daily.    gabapentin (NEURONTIN) 600 MG tablet Limit 1 tablet by mouth twice per day if tolerated Qty: 60 tablet, Refills: 0    hydrochlorothiazide (HYDRODIURIL) 25 MG tablet Take 25 mg by mouth every morning.     ibuprofen (ADVIL,MOTRIN) 800 MG tablet Take 800 mg by  mouth every 8 (eight) hours as needed for mild pain or moderate pain.     lactulose (CHRONULAC) 10 GM/15ML solution Take 30 g by mouth 2 (two) times daily as needed for mild constipation or moderate constipation.     lisinopril (PRINIVIL,ZESTRIL) 10 MG tablet Take 10 mg by mouth every morning.     metFORMIN (GLUCOPHAGE-XR) 500 MG 24 hr tablet Take 500 mg by mouth daily with breakfast.    ondansetron (ZOFRAN) 4 MG tablet Take 4 mg by mouth every 8 (eight) hours as needed for nausea or vomiting.     pravastatin (PRAVACHOL) 40 MG tablet Take 40 mg by mouth at bedtime.     senna-docusate (SENOKOT-S) 8.6-50 MG tablet Take 2 tablets by mouth daily. Qty: 60 tablet, Refills: 0    silver sulfADIAZINE (SILVADENE) 1 % cream Apply topically daily. Qty: 50 g, Refills: 0    Tapentadol HCl (NUCYNTA ER) 150 MG TB12 Limit 1 tab by mouth  every 8 -12 hours IF tolerated   (Note pill is now 150 mg and much stronger) Qty: 90 tablet, Refills: 0    tiZANidine (ZANAFLEX) 2 MG tablet Limit 1 tablet by mouth 2-4 times per day if tolerated Qty: 120 tablet, Refills: 0    traZODone (DESYREL) 150 MG tablet Take 300 mg by mouth at bedtime as needed for sleep.     !! Vilazodone HCl (VIIBRYD) 20 MG TABS Take 20 mg by mouth daily. Takes with 40mg  for total of 60mg  daily    !! Vilazodone HCl (VIIBRYD) 40 MG TABS Take 40 mg by mouth daily. Takes with 20mg  for total of 60mg  daily     !! - Potential duplicate medications found. Please discuss with provider.      If you experience worsening of your admission symptoms, develop shortness of breath, life threatening emergency, suicidal or homicidal thoughts you must seek medical attention immediately by calling 911 or calling your MD immediately  if symptoms less severe.  You Must read complete instructions/literature along with all the possible adverse reactions/side effects for all the Medicines you take and that have been prescribed to you. Take any new Medicines after  you have completely understood and accept all the possible adverse reactions/side effects.   Please note  You were cared for by a hospitalist during your hospital stay. If you have any questions about your discharge medications or the care you received while you were in the hospital after you are discharged, you can call the unit and asked to speak with the hospitalist on call if the hospitalist that took care of you is not available. Once you are discharged, your primary care physician will handle any further medical issues. Please note that NO REFILLS for any discharge medications will be authorized once you are discharged, as it is imperative that you return to your primary care physician (or establish a relationship with a primary  care physician if you do not have one) for your aftercare needs so that they can reassess your need for medications and monitor your lab values. Today   SUBJECTIVE   Chronic abdominal pain  VITAL SIGNS:  Blood pressure (!) 121/57, pulse 84, temperature 97.8 F (36.6 C), temperature source Oral, resp. rate 16, height 5\' 5"  (1.651 m), weight 270 lb 9.6 oz (122.7 kg), SpO2 92 %.  I/O:   Intake/Output Summary (Last 24 hours) at 09/21/15 1407 Last data filed at 09/21/15 0804  Gross per 24 hour  Intake                0 ml  Output             1500 ml  Net            -1500 ml    PHYSICAL EXAMINATION:  GENERAL:  55 y.o.-year-old patient lying in the bed with no acute distress.  EYES: Pupils equal, round, reactive to light and accommodation. No scleral icterus. Extraocular muscles intact.  HEENT: Head atraumatic, normocephalic. Oropharynx and nasopharynx clear.  NECK:  Supple, no jugular venous distention. No thyroid enlargement, no tenderness.  LUNGS: Normal breath sounds bilaterally, no wheezing, rales,rhonchi or crepitation. No use of accessory muscles of respiration.  CARDIOVASCULAR: S1, S2 normal. No murmurs, rubs, or gallops.  ABDOMEN: Soft, non-tender,  non-distended. Bowel sounds present. No organomegaly or mass.  EXTREMITIES: No pedal edema, cyanosis, or clubbing.  NEUROLOGIC: Cranial nerves II through XII are intact. Muscle strength 5/5 in all extremities. Sensation intact. Gait not checked.  PSYCHIATRIC: The patient is alert and oriented x 3.  SKIN: No obvious rash, lesion, or ulcer.   DATA REVIEW:   CBC   Recent Labs Lab 09/20/15 0516  WBC 8.4  HGB 11.3*  HCT 32.9*  PLT 197    Chemistries   Recent Labs Lab 09/19/15 0737 09/20/15 0516 09/21/15 0609  NA 135 138  --   K 3.1* 3.0*  --   CL 94* 96*  --   CO2 31 33*  --   GLUCOSE 190* 219*  --   BUN 12 11  --   CREATININE 0.93 1.01*  --   CALCIUM 8.6* 8.5*  --   MG  --   --  2.0  AST 29  --   --   ALT 22  --   --   ALKPHOS 91  --   --   BILITOT 0.7  --   --     Microbiology Results   Recent Results (from the past 240 hour(s))  Urine culture     Status: Abnormal   Collection Time: 09/17/15 10:58 AM  Result Value Ref Range Status   Specimen Description URINE, CLEAN CATCH  Final   Special Requests NONE  Final   Culture MULTIPLE SPECIES PRESENT, SUGGEST RECOLLECTION (A)  Final   Report Status 09/18/2015 FINAL  Final  Urine culture     Status: Abnormal   Collection Time: 09/19/15  7:44 AM  Result Value Ref Range Status   Specimen Description URINE, CATHETERIZED  Final   Special Requests NONE  Final   Culture MULTIPLE SPECIES PRESENT, SUGGEST RECOLLECTION (A)  Final   Report Status 09/20/2015 FINAL  Final    RADIOLOGY:  No results found.   Management plans discussed with the patient, family and they are in agreement.  CODE STATUS:     Code Status Orders        Start  Ordered   09/19/15 1218  Full code  Continuous     09/19/15 1217    Code Status History    Date Active Date Inactive Code Status Order ID Comments User Context   09/07/2015 12:37 AM 09/08/2015  8:59 PM Full Code [id number]  Lance Coon, MD Inpatient   08/24/2015  2:10 AM 08/28/2015  12:17 PM Full Code [id number]  Lance Coon, MD Inpatient      TOTAL TIME TAKING CARE OF THIS PATIENT: 40 minutes.    Saranya Harlin M.D on 09/21/2015 at 2:07 PM  Between 7am to 6pm - Pager - [phone] After 6pm go to [email or web address] - password [email or web address] [facility]  Office  [phone]  CC: Primary care physician; Ellamae Sia, MD

## 2015-09-21 NOTE — Progress Notes (Signed)
Patient discharged home per MD order. Prescriptions given to patient. All discharge instructions given and all questions answered. 

## 2015-09-21 NOTE — Progress Notes (Signed)
[facility] [place] NAME: Annette [name]    MR#:  [id number]  DATE OF BIRTH:  1960-06-18  SUBJECTIVE:  Came in with increasing abdominal pain for last couple days. This is patient's third admission since July 2017. She follows up at the pain clinic with Dr. crisp. No vomiting. Last bowel movement yday-small one.  REVIEW OF SYSTEMS:   Review of Systems  Constitutional: Negative for chills, fever and weight loss.  HENT: Negative for ear discharge, ear pain and nosebleeds.   Eyes: Negative for blurred vision, pain and discharge.  Respiratory: Negative for sputum production, shortness of breath, wheezing and stridor.   Cardiovascular: Negative for chest pain, palpitations, orthopnea and PND.  Gastrointestinal: Positive for abdominal pain. Negative for diarrhea, nausea and vomiting.  Genitourinary: Negative for frequency and urgency.  Musculoskeletal: Negative for back pain and joint pain.  Neurological: Positive for weakness. Negative for sensory change, speech change and focal weakness.  Psychiatric/Behavioral: Negative for depression and hallucinations. The patient is not nervous/anxious.    Tolerating Diet:yes Tolerating PT: ambulatory  DRUG ALLERGIES:   Allergies  Allergen Reactions  . Iodides Swelling  . Iodine Swelling  . Shellfish Allergy Swelling    shrimp causes swelling  . Shellfish-Derived Products Swelling  . Iodinated Diagnostic Agents Swelling  . Povidone Iodine Swelling    eyes  . Povidone-Iodine Swelling    VITALS:  Blood pressure (!) 121/57, pulse 84, temperature 97.8 F (36.6 C), temperature source Oral, resp. rate 16, height 5\' 5"  (1.651 m), weight 270 lb 9.6 oz (122.7 kg), SpO2 92 %.  PHYSICAL EXAMINATION:   Physical Exam  GENERAL:  55 y.o.-year-old patient lying in the bed with no acute distress. obese EYES: Pupils equal, round, reactive to light and accommodation. No scleral icterus. Extraocular  muscles intact.  HEENT: Head atraumatic, normocephalic. Oropharynx and nasopharynx clear.  NECK:  Supple, no jugular venous distention. No thyroid enlargement, no tenderness.  LUNGS: Normal breath sounds bilaterally, no wheezing, rales, rhonchi. No use of accessory muscles of respiration.  CARDIOVASCULAR: S1, S2 normal. No murmurs, rubs, or gallops.  ABDOMEN: Soft, diffuse tenderness +, nondistended. Bowel sounds present. No organomegaly or mass.  EXTREMITIES: No cyanosis, clubbing or edema b/l.    NEUROLOGIC: Cranial nerves II through XII are intact. No focal Motor or sensory deficits b/l.   PSYCHIATRIC:  patient is alert and oriented x 3.  SKIN: No obvious rash, lesion, or ulcer.   LABORATORY PANEL:  CBC  Recent Labs Lab 09/20/15 0516  WBC 8.4  HGB 11.3*  HCT 32.9*  PLT 197    Chemistries   Recent Labs Lab 09/19/15 0737 09/20/15 0516 09/21/15 0609  NA 135 138  --   K 3.1* 3.0*  --   CL 94* 96*  --   CO2 31 33*  --   GLUCOSE 190* 219*  --   BUN 12 11  --   CREATININE 0.93 1.01*  --   CALCIUM 8.6* 8.5*  --   MG  --   --  2.0  AST 29  --   --   ALT 22  --   --   ALKPHOS 91  --   --   BILITOT 0.7  --   --    Cardiac Enzymes No results for input(s): TROPONINI in the last 168 hours. RADIOLOGY:  Ct Abdomen Pelvis Wo Contrast  Result Date: 09/19/2015 CLINICAL DATA:  Lower abdominal pain EXAM: CT ABDOMEN AND PELVIS WITHOUT  CONTRAST TECHNIQUE: Multidetector CT imaging of the abdomen and pelvis was performed following the standard protocol without IV contrast. COMPARISON:  09/05/2005 FINDINGS: Innumerable bilateral pulmonary nodules are stable Innumerable lesions throughout the liver are stable. Spleen, pancreas, adrenal glands, gallbladder are within normal limits Bilateral hydronephrosis is stable. Markedly enlarged uterus consistent with the patient's known malignancy has grown in size. On sagittal reconstruction imaging, the uterus measures 9.7 x 16.9 cm. Previously, based  on my measurements, it measured 9.0 x 14 cm. It extends into the lower abdomen. No evidence of small-bowel obstruction. Appendix is within normal limits. The rectum is somewhat difficult to visualize. There is suspected wall thickening of the upper rectum. See image 83 of series 7. This may represent spread of malignancy or possibly an inflammatory process. Foley catheter decompresses the bladder. There is no free fluid. No vertebral compression deformity. IMPRESSION: Over the course of 2 weeks, there has been in increasing size of the uterus worrisome for worsening malignancy. Metastatic disease in the liver and lung bases is stable. There is wall thickening of the rectum representing either spread of tumor or possibly an inflammatory process. Foley catheter decompresses the bladder. Hydronephrosis persists and is likely related to compression of the ureters from the enlarged uterus. Electronically Signed   By: Marybelle Killings M.D.   On: 09/19/2015 10:09   ASSESSMENT AND PLAN:  55 year old African-American female with known widely metastatic uterine cancer who is presenting for intractable pain. At the pleasure of meeting this unfortunate lady on last admission, her pain was well-controlled on discharge however she has progressed, with evidence of disease progression on imaging over the last few weeks.  1. Intractable pain secondary to widely metastatic uterine cancer: - At home takes Nucynta 150 mg by mouth twice a day -I spoke with Dr. crisp who recommends continue her Nucynta ER 150 mg twice a day and oxycodone immediate release 20 mg every 3 hours when necessary. He recommends DC IV Dilaudid.  -Continue with bowel regimen  2. Bladder spasm: Oxybutynin, however this is likely related to mass effect from cancer -No evidence of UTI 3. Hyperlipidemia unspecified: Statin therapy  4. Essential hypertension: Lisinopril,  Hydrochlorothiazide  5. Type 2 diabetes non-insulin-requiring: on oral agents at  insulin sliding scale coverage  Discussed at length with patient and patient's daughter was present in the room.   Case discussed with Care Management/Social Worker. Management plans discussed with the patient, family and they are in agreement.  CODE STATUS:Full   DVT Prophylaxis:Lovenox  Total TIME TAKING CARE OF THIS PATIENT:30es.  >50% time spent on counselling and coordination of care  POSSIBLE D/C IN1-2 daYS, DEPENDING ON CLINICAL CONDITION.  Note: This dictation was prepared with Dragon dictation along with smaller phrase technology. Any transcriptional errors that result from this process are unintentional.  Cinde Ebert M.D on 09/21/2015 at 8:39 AM  Between 7am to 6pm - Pager - [phone]  After 6pm go to [email or web address] - password [email or web address] [facility]  Office  [phone]  CC: Primary care physician; Ellamae Sia, MD

## 2015-09-22 NOTE — Progress Notes (Signed)
This encounter was created in error - please disregard.  This encounter was created in error - please disregard.

## 2015-09-24 ENCOUNTER — Other Ambulatory Visit: Payer: Self-pay | Admitting: Internal Medicine

## 2015-09-24 ENCOUNTER — Inpatient Hospital Stay: Payer: Medicare Other

## 2015-09-24 ENCOUNTER — Other Ambulatory Visit: Payer: Self-pay

## 2015-09-24 ENCOUNTER — Telehealth: Payer: Self-pay | Admitting: Internal Medicine

## 2015-09-24 VITALS — BP 139/85 | HR 90 | Resp 20

## 2015-09-24 DIAGNOSIS — R339 Retention of urine, unspecified: Secondary | ICD-10-CM | POA: Diagnosis not present

## 2015-09-24 DIAGNOSIS — K5900 Constipation, unspecified: Secondary | ICD-10-CM | POA: Diagnosis not present

## 2015-09-24 DIAGNOSIS — Z87891 Personal history of nicotine dependence: Secondary | ICD-10-CM | POA: Diagnosis not present

## 2015-09-24 DIAGNOSIS — C548 Malignant neoplasm of overlapping sites of corpus uteri: Secondary | ICD-10-CM | POA: Diagnosis not present

## 2015-09-24 DIAGNOSIS — G893 Neoplasm related pain (acute) (chronic): Secondary | ICD-10-CM | POA: Diagnosis not present

## 2015-09-24 DIAGNOSIS — F329 Major depressive disorder, single episode, unspecified: Secondary | ICD-10-CM | POA: Diagnosis not present

## 2015-09-24 DIAGNOSIS — E876 Hypokalemia: Secondary | ICD-10-CM

## 2015-09-24 DIAGNOSIS — C787 Secondary malignant neoplasm of liver and intrahepatic bile duct: Secondary | ICD-10-CM | POA: Diagnosis not present

## 2015-09-24 DIAGNOSIS — C7800 Secondary malignant neoplasm of unspecified lung: Secondary | ICD-10-CM | POA: Diagnosis not present

## 2015-09-24 DIAGNOSIS — C540 Malignant neoplasm of isthmus uteri: Secondary | ICD-10-CM

## 2015-09-24 DIAGNOSIS — R1110 Vomiting, unspecified: Secondary | ICD-10-CM | POA: Diagnosis not present

## 2015-09-24 DIAGNOSIS — M5136 Other intervertebral disc degeneration, lumbar region: Secondary | ICD-10-CM | POA: Diagnosis not present

## 2015-09-24 DIAGNOSIS — I10 Essential (primary) hypertension: Secondary | ICD-10-CM | POA: Diagnosis not present

## 2015-09-24 DIAGNOSIS — Z79899 Other long term (current) drug therapy: Secondary | ICD-10-CM | POA: Diagnosis not present

## 2015-09-24 DIAGNOSIS — G629 Polyneuropathy, unspecified: Secondary | ICD-10-CM | POA: Diagnosis not present

## 2015-09-24 DIAGNOSIS — E785 Hyperlipidemia, unspecified: Secondary | ICD-10-CM | POA: Diagnosis not present

## 2015-09-24 DIAGNOSIS — Z7982 Long term (current) use of aspirin: Secondary | ICD-10-CM | POA: Diagnosis not present

## 2015-09-24 DIAGNOSIS — X19XXXA Contact with other heat and hot substances, initial encounter: Secondary | ICD-10-CM | POA: Diagnosis not present

## 2015-09-24 DIAGNOSIS — R300 Dysuria: Secondary | ICD-10-CM | POA: Diagnosis not present

## 2015-09-24 DIAGNOSIS — N2889 Other specified disorders of kidney and ureter: Secondary | ICD-10-CM | POA: Diagnosis not present

## 2015-09-24 DIAGNOSIS — E119 Type 2 diabetes mellitus without complications: Secondary | ICD-10-CM | POA: Diagnosis not present

## 2015-09-24 DIAGNOSIS — N1330 Unspecified hydronephrosis: Secondary | ICD-10-CM | POA: Diagnosis not present

## 2015-09-24 DIAGNOSIS — Z5111 Encounter for antineoplastic chemotherapy: Secondary | ICD-10-CM | POA: Diagnosis not present

## 2015-09-24 DIAGNOSIS — G4700 Insomnia, unspecified: Secondary | ICD-10-CM | POA: Diagnosis not present

## 2015-09-24 DIAGNOSIS — E669 Obesity, unspecified: Secondary | ICD-10-CM | POA: Diagnosis not present

## 2015-09-24 LAB — CBC WITH DIFFERENTIAL/PLATELET
BASOS PCT: 1 %
Basophils Absolute: 0.1 10*3/uL (ref 0–0.1)
Eosinophils Absolute: 0.1 10*3/uL (ref 0–0.7)
Eosinophils Relative: 1 %
HEMATOCRIT: 32 % — AB (ref 35.0–47.0)
HEMOGLOBIN: 10.7 g/dL — AB (ref 12.0–16.0)
LYMPHS ABS: 2.1 10*3/uL (ref 1.0–3.6)
LYMPHS PCT: 25 %
MCH: 28.7 pg (ref 26.0–34.0)
MCHC: 33.4 g/dL (ref 32.0–36.0)
MCV: 86.1 fL (ref 80.0–100.0)
MONO ABS: 1.1 10*3/uL — AB (ref 0.2–0.9)
MONOS PCT: 13 %
NEUTROS ABS: 5.1 10*3/uL (ref 1.4–6.5)
NEUTROS PCT: 60 %
Platelets: 204 10*3/uL (ref 150–440)
RBC: 3.72 MIL/uL — ABNORMAL LOW (ref 3.80–5.20)
RDW: 13.3 % (ref 11.5–14.5)
WBC: 8.4 10*3/uL (ref 3.6–11.0)

## 2015-09-24 LAB — BASIC METABOLIC PANEL
Anion gap: 9 (ref 5–15)
BUN: 11 mg/dL (ref 6–20)
CALCIUM: 8.2 mg/dL — AB (ref 8.9–10.3)
CHLORIDE: 94 mmol/L — AB (ref 101–111)
CO2: 29 mmol/L (ref 22–32)
CREATININE: 0.94 mg/dL (ref 0.44–1.00)
GFR calc non Af Amer: >60 mL/min (ref >60)
GLUCOSE: 218 mg/dL — AB (ref 65–99)
Potassium: 2.9 mmol/L — ABNORMAL LOW (ref 3.5–5.1)
Sodium: 132 mmol/L — ABNORMAL LOW (ref 135–145)

## 2015-09-24 MED ORDER — PROCHLORPERAZINE MALEATE 10 MG PO TABS
10.0000 mg | ORAL_TABLET | Freq: Once | ORAL | Status: AC
  Administered 2015-09-24: 10 mg via ORAL
  Filled 2015-09-24: qty 1

## 2015-09-24 MED ORDER — SODIUM CHLORIDE 0.9 % IV SOLN
Freq: Once | INTRAVENOUS | Status: AC
  Administered 2015-09-24: 10:00:00 via INTRAVENOUS
  Filled 2015-09-24: qty 1000

## 2015-09-24 MED ORDER — DIPHENHYDRAMINE HCL 50 MG/ML IJ SOLN
12.5000 mg | Freq: Once | INTRAMUSCULAR | Status: AC
  Administered 2015-09-24: 12.5 mg via INTRAVENOUS
  Filled 2015-09-24: qty 1

## 2015-09-24 MED ORDER — SODIUM CHLORIDE 0.9 % IV SOLN
Freq: Once | INTRAVENOUS | Status: AC
  Administered 2015-09-24: 11:00:00 via INTRAVENOUS
  Filled 2015-09-24: qty 100

## 2015-09-24 MED ORDER — POTASSIUM CHLORIDE 10 MEQ/100ML IV SOLN
10.0000 meq | Freq: Once | INTRAVENOUS | Status: DC
  Filled 2015-09-24: qty 100

## 2015-09-24 MED ORDER — TEMSIROLIMUS CHEMO INJECTION 25 MG/ML W/DILUENT
25.0000 mg | Freq: Once | INTRAVENOUS | Status: AC
  Administered 2015-09-24: 25 mg via INTRAVENOUS
  Filled 2015-09-24: qty 2.5

## 2015-09-24 MED ORDER — POTASSIUM CHLORIDE ER 20 MEQ PO TBCR: 20.0000 meq | EXTENDED_RELEASE_TABLET | Freq: Every day | ORAL | 0 refills | Status: DC

## 2015-09-24 NOTE — Telephone Encounter (Signed)
Call regarding uncontrolled pain. Recommend taking extra oxycodone for pain control. We'll speak to Dr. crisp on Monday morning.

## 2015-09-25 ENCOUNTER — Emergency Department: Payer: Medicare Other

## 2015-09-25 ENCOUNTER — Encounter: Payer: Self-pay | Admitting: Emergency Medicine

## 2015-09-25 ENCOUNTER — Emergency Department
Admission: EM | Admit: 2015-09-25 | Discharge: 2015-09-26 | Disposition: A | Discharge status: Home / Self Care | Payer: Medicare Other | Attending: Emergency Medicine | Admitting: Emergency Medicine

## 2015-09-25 DIAGNOSIS — R339 Retention of urine, unspecified: Secondary | ICD-10-CM

## 2015-09-25 DIAGNOSIS — Z8542 Personal history of malignant neoplasm of other parts of uterus: Secondary | ICD-10-CM | POA: Diagnosis not present

## 2015-09-25 DIAGNOSIS — J45909 Unspecified asthma, uncomplicated: Secondary | ICD-10-CM | POA: Diagnosis not present

## 2015-09-25 DIAGNOSIS — Z87891 Personal history of nicotine dependence: Secondary | ICD-10-CM | POA: Diagnosis not present

## 2015-09-25 DIAGNOSIS — E119 Type 2 diabetes mellitus without complications: Secondary | ICD-10-CM | POA: Diagnosis not present

## 2015-09-25 DIAGNOSIS — Z7984 Long term (current) use of oral hypoglycemic drugs: Secondary | ICD-10-CM | POA: Insufficient documentation

## 2015-09-25 DIAGNOSIS — Z79891 Long term (current) use of opiate analgesic: Secondary | ICD-10-CM | POA: Diagnosis not present

## 2015-09-25 DIAGNOSIS — N130 Hydronephrosis with ureteropelvic junction obstruction: Secondary | ICD-10-CM | POA: Insufficient documentation

## 2015-09-25 DIAGNOSIS — Z7982 Long term (current) use of aspirin: Secondary | ICD-10-CM | POA: Diagnosis not present

## 2015-09-25 DIAGNOSIS — R300 Dysuria: Secondary | ICD-10-CM | POA: Diagnosis present

## 2015-09-25 DIAGNOSIS — Q6211 Congenital occlusion of ureteropelvic junction: Secondary | ICD-10-CM

## 2015-09-25 DIAGNOSIS — I10 Essential (primary) hypertension: Secondary | ICD-10-CM | POA: Diagnosis not present

## 2015-09-25 LAB — CBC
HCT: 36.2 % (ref 35.0–47.0)
Hemoglobin: 12 g/dL (ref 12.0–16.0)
MCH: 28.7 pg (ref 26.0–34.0)
MCHC: 33.1 g/dL (ref 32.0–36.0)
MCV: 86.7 fL (ref 80.0–100.0)
PLATELETS: 246 10*3/uL (ref 150–440)
RBC: 4.18 MIL/uL (ref 3.80–5.20)
RDW: 13.4 % (ref 11.5–14.5)
WBC: 17.3 10*3/uL — AB (ref 3.6–11.0)

## 2015-09-25 LAB — URINALYSIS COMPLETE WITH MICROSCOPIC (ARMC ONLY)
Bacteria, UA: NONE SEEN
Bilirubin Urine: NEGATIVE
GLUCOSE, UA: 150 mg/dL — AB
Ketones, ur: NEGATIVE mg/dL
LEUKOCYTES UA: NEGATIVE
NITRITE: NEGATIVE
PH: 7 (ref 5.0–8.0)
Protein, ur: 100 mg/dL — AB
SPECIFIC GRAVITY, URINE: 1.019 (ref 1.005–1.030)

## 2015-09-25 LAB — BASIC METABOLIC PANEL
Anion gap: 17 — ABNORMAL HIGH (ref 5–15)
BUN: 12 mg/dL (ref 6–20)
CALCIUM: 8.9 mg/dL (ref 8.9–10.3)
CHLORIDE: 94 mmol/L — AB (ref 101–111)
CO2: 23 mmol/L (ref 22–32)
CREATININE: 1.18 mg/dL — AB (ref 0.44–1.00)
GFR calc Af Amer: 59 mL/min — ABNORMAL LOW (ref >60)
GFR calc non Af Amer: 51 mL/min — ABNORMAL LOW (ref >60)
Glucose, Bld: 216 mg/dL — ABNORMAL HIGH (ref 65–99)
Potassium: 3.1 mmol/L — ABNORMAL LOW (ref 3.5–5.1)
SODIUM: 134 mmol/L — AB (ref 135–145)

## 2015-09-25 MED ORDER — SODIUM CHLORIDE 0.9 % IV BOLUS (SEPSIS)
1000.0000 mL | Freq: Once | INTRAVENOUS | Status: AC
  Administered 2015-09-26: 1000 mL via INTRAVENOUS

## 2015-09-25 MED ORDER — OXYBUTYNIN CHLORIDE 5 MG PO TABS
5.0000 mg | ORAL_TABLET | Freq: Three times a day (TID) | ORAL | Status: DC
  Administered 2015-09-26: 5 mg via ORAL
  Filled 2015-09-25 (×2): qty 1

## 2015-09-25 MED ORDER — MORPHINE SULFATE (PF) 4 MG/ML IV SOLN
4.0000 mg | Freq: Once | INTRAVENOUS | Status: AC
  Administered 2015-09-26: 4 mg via INTRAVENOUS
  Filled 2015-09-25: qty 1

## 2015-09-25 MED ORDER — ONDANSETRON HCL 4 MG/2ML IJ SOLN
4.0000 mg | Freq: Once | INTRAMUSCULAR | Status: AC
  Administered 2015-09-26: 4 mg via INTRAVENOUS
  Filled 2015-09-25: qty 2

## 2015-09-25 NOTE — ED Notes (Signed)
ED tech Judson Roch says when she entered room pt's aide was giving her medication; pt says aide gave her oxycodone and nucynta

## 2015-09-25 NOTE — ED Triage Notes (Addendum)
Pt presents with urinary retention and dysuria; pt with history of uterine cancer; home health aide with pt says pt was here about 1 week ago and admitted for same; aide says pt was told the mass on her uterus is pressing against the urethra; pt moaning; says she has not been able to void in an hour; 59ml noted in bladder on bladder scanner; pt receives IV chemo medication every Friday;

## 2015-09-25 NOTE — ED Provider Notes (Signed)
[facility] Emergency Department Provider Note   ____________________________________________   First MD Initiated Contact with Patient 09/25/15 2327     (approximate)  I have reviewed the triage vital signs and the nursing notes.   HISTORY  Chief Complaint Urinary Retention and Dysuria    HPI Annette [name] is a 55 y.o. female who comes into the hospital today with little to no urine output for multiple hours today. The patient reports that she had the urge to urinate but nothing would come out. She reports that this started around 2 PM today. She feels better now but she reports that when she did urinate she felt that there was cutting off. It was very painful. She reports that it hurts as well when she urinates. She took some Nucynta and oxycodone around 40 5 PM but it didn't help. The patient denies any fevers, nausea, vomiting. She reports that she has not been admitted for UTIs in the past and she has not had UTIs in the past although looking at past notes it appears that she has. The patient rates her pain 8 out of 10 in intensity. She also has some left flank pain at this time. She is here for evaluation and is very tearful.   Past Medical History:  Diagnosis Date  . Asthma   . Back pain   . Cancer ([place])   . DDD (degenerative disc disease), lumbar   . Depression   . Diabetes mellitus without complication ([place])   . Hyperlipidemia   . Hypertension   . Insomnia   . Menopausal syndrome   . Obesity   . Onychomycosis   . Peripheral neuropathy ([place])   . Renal insufficiency     Patient Active Problem List   Diagnosis Date Noted  . Generalized abdominal pain   . Palliative care encounter   . Chronic pain   . Bilateral hydronephrosis 09/06/2015  . Sore on leg 08/24/2015  . Intractable pain 08/23/2015  . HTN (hypertension) 08/23/2015  . HLD (hyperlipidemia) 08/23/2015  . Cancer of uterine body ([place]) 08/18/2015  . Malignant neoplasm of  overlapping sites of body of uterus ([place]) 08/18/2015  . Uterine mass 07/28/2015  . 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 DDD (degenerative disc disease), lumbar 06/24/2014  . Facet syndrome, lumbar 06/24/2014  . Neuropathy due to secondary diabetes ([place]) 06/24/2014  . DDD (degenerative disc disease), cervical 06/24/2014  . Occipital neuralgia 06/24/2014  . LBP (low back pain) 06/24/2014  . Degeneration of intervertebral disc of cervical region 06/24/2014  . Degeneration of intervertebral disc of lumbar region 06/24/2014  . Diabetes mellitus ([place]) 06/24/2014  . Large breasts 05/30/2012    Past Surgical History:  Procedure Laterality Date  . COSMETIC SURGERY    . JOINT REPLACEMENT Bilateral [id number]  . LIVER BIOPSY    . REDUCTION MAMMAPLASTY Bilateral 12/15/2013  . SHOULDER SURGERY Left     Prior to Admission medications   Medication Sig Start Date End Date Taking? Authorizing Provider  alprazolam Duanne Moron) 2 MG tablet Take 2 mg by mouth 2 (two) times daily as needed for anxiety.    Yes Historical Provider, MD  aspirin EC 81 MG tablet Take 81 mg by mouth every morning.    Yes Historical Provider, MD  buPROPion (WELLBUTRIN SR) 150 MG 12 hr tablet Take 150 mg by mouth daily.   Yes Historical Provider, MD  gabapentin (NEURONTIN) 600 MG tablet Limit 1 tablet by mouth twice per day if tolerated  09/02/15  Yes Mohammed Kindle, MD  hydrochlorothiazide (HYDRODIURIL) 25 MG tablet Take 25 mg by mouth every morning.    Yes Historical Provider, MD  lactulose (CHRONULAC) 10 GM/15ML solution Take 30 g by mouth 2 (two) times daily as needed for mild constipation or moderate constipation.    Yes Historical Provider, MD  lisinopril (PRINIVIL,ZESTRIL) 10 MG tablet Take 10 mg by mouth every morning.    Yes Historical Provider, MD  metFORMIN (GLUCOPHAGE-XR) 500 MG 24 hr tablet Take 500 mg by mouth daily with breakfast.   Yes Historical Provider, MD  Oxycodone HCl 10 MG TABS Take 2 tablets (20 mg total)  by mouth every 3 (three) hours as needed. Limit 1 tablet by mouth every 4 hours as needed for breakthrough pain while taking Nucynta if tolerated 09/21/15  Yes Fritzi Mandes, MD  polyethylene glycol (MIRALAX / GLYCOLAX) packet Take 17 g by mouth 2 (two) times daily. 09/21/15  Yes Fritzi Mandes, MD  Potassium Chloride ER 20 MEQ TBCR Take 20 mEq by mouth daily. 09/24/15  Yes Cammie Sickle, MD  pravastatin (PRAVACHOL) 40 MG tablet Take 40 mg by mouth at bedtime.    Yes Historical Provider, MD  senna-docusate (SENOKOT-S) 8.6-50 MG tablet Take 2 tablets by mouth daily. Patient taking differently: Take 2 tablets by mouth every morning.  08/28/15  Yes Bettey Costa, MD  silver sulfADIAZINE (SILVADENE) 1 % cream Apply topically daily. Patient taking differently: Apply 1 application topically daily.  08/28/15  Yes Bettey Costa, MD  Tapentadol HCl (NUCYNTA ER) 150 MG TB12 Limit 1 tab by mouth  every 8 -12 hours IF tolerated   (Note pill is now 150 mg and much stronger) Patient taking differently: Take 150 mg by mouth 2 (two) times daily. Limit 1 tab by mouth  every 8 -12 hours IF tolerated   (Note pill is now 150 mg and much stronger) 09/02/15  Yes Mohammed Kindle, MD  tiZANidine (ZANAFLEX) 2 MG tablet Limit 1 tablet by mouth 2-4 times per day if tolerated 09/02/15  Yes Mohammed Kindle, MD  traZODone (DESYREL) 150 MG tablet Take 300 mg by mouth at bedtime as needed for sleep.    Yes Historical Provider, MD  Vilazodone HCl (VIIBRYD) 20 MG TABS Take 20 mg by mouth daily. Takes with 40mg  for total of 60mg  daily   Yes Historical Provider, MD  Vilazodone HCl (VIIBRYD) 40 MG TABS Take 40 mg by mouth daily. Takes with 20mg  for total of 60mg  daily   Yes Historical Provider, MD  oxybutynin (DITROPAN XL) 10 MG 24 hr tablet Take 1 tablet (10 mg total) by mouth daily. 09/26/15 09/25/16  Loney Hering, MD    Allergies Iodides; Iodine; Shellfish allergy; Shellfish-derived products; Iodinated diagnostic agents; Povidone iodine; and  Povidone-iodine  Family History  Problem Relation Age of Onset  . COPD Mother   . Sleep apnea Mother   . Alcohol abuse Mother   . Depression Mother   . Stroke Mother   . Heart disease Father   . Heart disease Sister     Social History Social History  Substance Use Topics  . Smoking status: Former Smoker    Quit date: 08/19/2015  . Smokeless tobacco: Never Used  . Alcohol use No    Review of Systems Constitutional: No fever/chills Eyes: No visual changes. ENT: No sore throat. Cardiovascular: Denies chest pain. Respiratory: Denies shortness of breath. Gastrointestinal:  abdominal pain.  No nausea, no vomiting.  No diarrhea.  No constipation. Genitourinary:  Dysuria, urinary retention Musculoskeletal: Left Flank pain Skin: Negative for rash. Neurological: Negative for headaches, focal weakness or numbness.  10-point ROS otherwise negative.  ____________________________________________   PHYSICAL EXAM:  VITAL SIGNS: ED Triage Vitals  Enc Vitals Group     BP 09/25/15 2110 105/80     Pulse Rate 09/25/15 2110 (!) 128     Resp 09/25/15 2110 (!) 28     Temp 09/25/15 2110 98.4 F (36.9 C)     Temp Source 09/25/15 2110 Oral     SpO2 09/25/15 2110 99 %     Weight 09/25/15 2110 271 lb (122.9 kg)     Height 09/25/15 2110 5\' 5"  (1.651 m)     Head Circumference --      Peak Flow --      Pain Score 09/25/15 2111 10     Pain Loc --      Pain Edu? --      Excl. in [place]? --     Constitutional: Alert and oriented. Well appearing and in Moderate distress. Eyes: Conjunctivae are normal. PERRL. EOMI. Head: Atraumatic. Nose: No congestion/rhinnorhea. Mouth/Throat: Mucous membranes are moist.  Oropharynx non-erythematous. Cardiovascular: Normal rate, regular rhythm. Grossly normal heart sounds.  Good peripheral circulation. Respiratory: Normal respiratory effort.  No retractions. Lungs CTAB. Gastrointestinal: Soft with lower abdominal tenderness to palpation. No distention.  Positive bowel sounds, left CVA tenderness to palpation Musculoskeletal: No lower extremity tenderness nor edema.   Neurologic:  Normal speech and language.  Skin:  Skin is warm, dry and intact.  Psychiatric: Mood and affect are normal.   ____________________________________________   LABS (all labs ordered are listed, but only abnormal results are displayed)  Labs Reviewed  URINALYSIS COMPLETEWITH MICROSCOPIC ([facility] ONLY) - Abnormal; Notable for the following:       Result Value   Color, Urine YELLOW (*)    APPearance CLOUDY (*)    Glucose, UA 150 (*)    Hgb urine dipstick 1+ (*)    Protein, ur 100 (*)    Squamous Epithelial / LPF 0-5 (*)    All other components within normal limits  BASIC METABOLIC PANEL - Abnormal; Notable for the following:    Sodium 134 (*)    Potassium 3.1 (*)    Chloride 94 (*)    Glucose, Bld 216 (*)    Creatinine, Ser 1.18 (*)    GFR calc non Af Amer 51 (*)    GFR calc Af Amer 59 (*)    Anion gap 17 (*)    All other components within normal limits  CBC - Abnormal; Notable for the following:    WBC 17.3 (*)    All other components within normal limits  URINE CULTURE  LACTIC ACID, PLASMA  LACTIC ACID, PLASMA   ____________________________________________  EKG  None ____________________________________________  RADIOLOGY  CT renal stone study ____________________________________________   PROCEDURES  Procedure(s) performed: None  Procedures  Critical Care performed: No  ____________________________________________   INITIAL IMPRESSION / ASSESSMENT AND PLAN / ED COURSE  Pertinent labs & imaging results that were available during my care of the patient were reviewed by me and considered in my medical decision making (see chart for details).  This is a 55 year old female who comes into the hospital today with some left flank pain, urinary retention and dysuria. The patient is very tearful and in pain. I'll give the patient a liter  of normal saline as well as some morphine and Zofran. I will send the patient for  a CT scan and then reassess the patient once I received all of her results.  Clinical Course  Value Comment By Time  CT Renal Stone Study 1. Diffuse irregular enlargement of the uterus, measuring 16.7 x 15.6 x 10.0 cm, similar in appearance to the recent prior study and reflecting underlying uterine mass. Associated soft tissue inflammation and trace free fluid noted, mildly more prominent than on the prior study. 2. Apparent diffuse infiltration of the rectum from the large pelvic mass; the sigmoid colon is not well assessed due to surrounding mass. 3. Moderate bilateral hydronephrosis, with diffuse dilatation of both ureters. This appears to reflect obstruction due to the large pelvic mass. 4. Numerous prominent masses throughout the liver, compatible with metastatic disease. 5. Numerous nodules of varying size throughout the lung bases, reflecting metastatic disease. 6. Suggestion of a 5 mm stone at the distal common bile duct, without evidence of dilatation of the common bile duct.   Loney Hering, MD 08/20 [phone]    I discussed the results with the patient. She received multiple doses of Dilaudid as well as another dose of morphine while in the emergency department. 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 The patient reports that she is undergoing infusions to help shrink the tumor. At that time then she will determine if she will have surgery or what her next course of action will be. I am unsure if this tumor is larger or smaller than it was previously. I did contact the urologist on call Dr. Louis Meckel and he suggested placing a Foley catheter. This will help the patient with feeling retention as well as having the symptoms of urgency and frequency. She said at this point there is not much else to do. The patient does need to follow with her oncologist to  determine how to best treat this cancer. Otherwise the patient has no further complaints or concerns. She reports that she was having some spasms with the catheter but it was not constant nor was it severe. I will discharge the patient to home with her catheter in place. The patient will be discharged and encouraged to follow up.  ____________________________________________   FINAL CLINICAL IMPRESSION(S) / ED DIAGNOSES  Final diagnoses:  Urinary retention  Hydronephrosis with ureteropelvic junction (UPJ) obstruction      NEW MEDICATIONS STARTED DURING THIS VISIT:  Discharge Medication List as of 09/26/2015  5:49 AM    START taking these medications   Details  oxybutynin (DITROPAN XL) 10 MG 24 hr tablet Take 1 tablet (10 mg total) by mouth daily., Starting Sun 09/26/2015, Until Mon 09/25/2016, Print         Note:  This document was prepared using Dragon voice recognition software and may include unintentional dictation errors.    Loney Hering, MD 09/26/15 [phone]

## 2015-09-25 NOTE — ED Notes (Signed)
Attempt int x1 without success. Dawn, rn in to attempt int.

## 2015-09-26 ENCOUNTER — Emergency Department: Payer: Medicare Other

## 2015-09-26 DIAGNOSIS — N130 Hydronephrosis with ureteropelvic junction obstruction: Secondary | ICD-10-CM | POA: Diagnosis not present

## 2015-09-26 MED ORDER — MORPHINE SULFATE (PF) 4 MG/ML IV SOLN
4.0000 mg | Freq: Once | INTRAVENOUS | Status: AC
  Administered 2015-09-26: 4 mg via INTRAVENOUS
  Filled 2015-09-26: qty 1

## 2015-09-26 MED ORDER — HYDROMORPHONE HCL 1 MG/ML PO LIQD: 1.0000 mg | Freq: Once | ORAL | Status: DC

## 2015-09-26 MED ORDER — HYDROMORPHONE HCL 1 MG/ML IJ SOLN
INTRAMUSCULAR | Status: AC
  Administered 2015-09-26: 1 mg
  Filled 2015-09-26: qty 1

## 2015-09-26 MED ORDER — HYDROMORPHONE HCL 1 MG/ML IJ SOLN
1.0000 mg | Freq: Once | INTRAMUSCULAR | Status: DC
  Filled 2015-09-26: qty 1

## 2015-09-26 MED ORDER — HYDROMORPHONE HCL 1 MG/ML IJ SOLN
1.0000 mg | Freq: Once | INTRAMUSCULAR | Status: AC
  Administered 2015-09-26: 1 mg via INTRAMUSCULAR

## 2015-09-26 MED ORDER — BACITRACIN ZINC 500 UNIT/GM EX OINT
TOPICAL_OINTMENT | CUTANEOUS | Status: AC
  Filled 2015-09-26: qty 0.9

## 2015-09-26 MED ORDER — OXYBUTYNIN CHLORIDE ER 10 MG PO TB24: 10.0000 mg | ORAL_TABLET | Freq: Every day | ORAL | 0 refills | Status: DC

## 2015-09-26 NOTE — ED Notes (Signed)
Pt. Helped to bathroom to urinate.

## 2015-09-27 LAB — URINE CULTURE: CULTURE: NO GROWTH

## 2015-09-28 ENCOUNTER — Other Ambulatory Visit: Payer: Self-pay | Admitting: *Deleted

## 2015-09-28 ENCOUNTER — Telehealth: Payer: Self-pay | Admitting: Internal Medicine

## 2015-09-28 MED ORDER — OXYCODONE HCL 10 MG PO TABS: ORAL_TABLET | ORAL | 0 refills | Status: DC

## 2015-09-28 NOTE — Telephone Encounter (Signed)
Informed that prescription is ready to pick up  

## 2015-09-28 NOTE — Telephone Encounter (Signed)
Spoke to Dr. crisp regarding patient's pain management/given the malignancy/uncontrolled pain - we'll manage patient's pain. He agrees. Also will evaluate for nerve blocks.

## 2015-09-28 NOTE — Telephone Encounter (Signed)
Called to report that she is out of Oxycodone and that she cannot have too filled again until 8/23. Per phone note from Dr Rogue Bussing 8/18, he was going to speak with Dr Primus Bravo on 8/21 regarding him telling her to take extra Oxycodone for uncontrolled pain. Please advise

## 2015-09-29 ENCOUNTER — Encounter: Payer: Self-pay | Admitting: Pain Medicine

## 2015-09-29 ENCOUNTER — Ambulatory Visit: Payer: Medicare Other | Attending: Pain Medicine | Admitting: Pain Medicine

## 2015-09-29 VITALS — BP 109/63 | HR 72 | Temp 98.5°F | Resp 16 | Ht 65.0 in | Wt 280.0 lb

## 2015-09-29 DIAGNOSIS — M62830 Muscle spasm of back: Secondary | ICD-10-CM | POA: Insufficient documentation

## 2015-09-29 DIAGNOSIS — M51369 Other intervertebral disc degeneration, lumbar region without mention of lumbar back pain or lower extremity pain: Secondary | ICD-10-CM

## 2015-09-29 DIAGNOSIS — M19012 Primary osteoarthritis, left shoulder: Secondary | ICD-10-CM | POA: Diagnosis not present

## 2015-09-29 DIAGNOSIS — M546 Pain in thoracic spine: Secondary | ICD-10-CM | POA: Diagnosis present

## 2015-09-29 DIAGNOSIS — M5481 Occipital neuralgia: Secondary | ICD-10-CM | POA: Insufficient documentation

## 2015-09-29 DIAGNOSIS — M533 Sacrococcygeal disorders, not elsewhere classified: Secondary | ICD-10-CM | POA: Insufficient documentation

## 2015-09-29 DIAGNOSIS — R160 Hepatomegaly, not elsewhere classified: Secondary | ICD-10-CM | POA: Diagnosis not present

## 2015-09-29 DIAGNOSIS — M5030 Other cervical disc degeneration, unspecified cervical region: Secondary | ICD-10-CM

## 2015-09-29 DIAGNOSIS — M47816 Spondylosis without myelopathy or radiculopathy, lumbar region: Secondary | ICD-10-CM | POA: Diagnosis not present

## 2015-09-29 DIAGNOSIS — G588 Other specified mononeuropathies: Secondary | ICD-10-CM | POA: Insufficient documentation

## 2015-09-29 DIAGNOSIS — C55 Malignant neoplasm of uterus, part unspecified: Secondary | ICD-10-CM | POA: Insufficient documentation

## 2015-09-29 DIAGNOSIS — M19011 Primary osteoarthritis, right shoulder: Secondary | ICD-10-CM | POA: Diagnosis not present

## 2015-09-29 DIAGNOSIS — M5136 Other intervertebral disc degeneration, lumbar region: Secondary | ICD-10-CM | POA: Diagnosis not present

## 2015-09-29 DIAGNOSIS — M79606 Pain in leg, unspecified: Secondary | ICD-10-CM | POA: Diagnosis present

## 2015-09-29 DIAGNOSIS — G5701 Lesion of sciatic nerve, right lower limb: Secondary | ICD-10-CM

## 2015-09-29 DIAGNOSIS — M542 Cervicalgia: Secondary | ICD-10-CM | POA: Diagnosis present

## 2015-09-29 DIAGNOSIS — Z96641 Presence of right artificial hip joint: Secondary | ICD-10-CM | POA: Insufficient documentation

## 2015-09-29 DIAGNOSIS — E1340 Other specified diabetes mellitus with diabetic neuropathy, unspecified: Secondary | ICD-10-CM

## 2015-09-29 NOTE — Patient Instructions (Addendum)
PLAN   Continue present medication Neurontin Zanaflex oxycodone and Nucynta ER until you follow-up with  Dr. Rogue Bussing who will be prescribing medications for treatment of pain. We agreed that he would prescribe medications for pain and that I would perform procedures for pain if procedures are necessary.  Caution pill is stronger Nucynta ER and can cause respiratory depression, stopping of breathing, excessive sedation, confusion, and other side effects as we reminded you at time of previous visit   F/U PCP Dr. Quay Burow for evaluation of  BP carcinoma and general medical  condition.   F/U surgical evaluation as discussed  F/U oncologist Dr.Brahmanday this week as scheduled and as discussed  F/U neurological evaluation. May consider PNCV/EMG studies and other studies pending follow-up evaluations. We will avoid such studies at this time F/U GI evaluation as planned  F/U psych evaluation Please follow-up with Dr. Kasandra Knudsen as discussed  May consider radiofrequency rhizolysis or intraspinal procedures pending response to present treatment and F/U evaluation . The present time we will avoid such treatment  Patient to call Pain Management Center should patient have concerns prior to scheduled return appointment.

## 2015-09-29 NOTE — Progress Notes (Signed)
Safety precautions to be maintained throughout the outpatient stay will include: orient to surroundings, keep bed in low position, maintain call bell within reach at all times, provide assistance with transfer out of bed and ambulation.  

## 2015-09-29 NOTE — Progress Notes (Signed)
The patient is a 55 year old female who returns to pain management for further evaluation and treatment of pain involving the neck entire back upper and lower extremity regions. The patient was recently hospitalized again for severe pain. During the hospitalization the patient's condition was discussed with patient's oncologist Dr.Brahmanday. We decided to allow Dr.Brahmanday prescribed patient's medications for treatment of patient's pain and I would remain available to consider interventional treatment such as celiac plexus block, hypogastric plexus block, and other procedures for treatment of patient's pain as needed. On today's visit the patient stated that she continues to have significant pain that interfered with all activities of daily living as well as ability to obtain restful sleep... We explained to patient that her oncologist would be prescribing her medications and that we will remain available for interventional treatment. The patient was with understanding and agreed with suggested treatment plan. The patient complained of abdominal pain with pelvic pain of lesser degree as well as pain involving the region of the lumbar area.   Physical examination  There was tenderness to palpation of the paraspinal muscular treat the cervical region cervical facet region a mild degree with mild tenderness of the splenius capitis and occipitalis region. No new masses of the head and neck were noted. There were no bounding pulsations of the temporal region noted. Palpation of the acromioclavicular and glenohumeral joint regions reproduce mild discomfort area there was moderate tenderness to palpation of the trapezius levator scapula and rhomboid musculature regions. The patient appeared to be with bilaterally equal grip strength without significant increased pain with Tinel and Phalen's maneuver. Palpation of the thoracic region was with increased muscle spasms of the mid and lower thoracic region  palpation which reproduces mild to moderate discomfort. Palpation over the PSIS and PII S regions reproduce moderate discomfort. There was moderate tenderness of the greater trochanteric region iliotibial band region. Straight leg raise was tolerates approximately 30 without a definite increased pain with dorsiflexion noted. The knees were with tenderness to palpation with crepitus of the knees. No definite sensory deficit of dermatomal distribution was detected. EHL strength was decreased on the right compared to the left. Patient status post total hip replacement on the right with hematoma formation following surgery resulting in sciatic nerve palsy. There was negative Homans. 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 Rogue Bussing who will be prescribing medications for treatment of pain. We agreed that he would prescribe medications for pain and that I would perform procedures for pain if procedures are necessary.  Caution pill is stronger Nucynta ER and can cause respiratory depression, stopping of breathing, excessive sedation, confusion, and other side effects as we reminded you at time of previous visit   F/U PCP Dr. Quay Burow for evaluation of  BP carcinoma and general medical  condition.   F/U surgical  evaluation as discussed  F/U  oncologist Dr.Brahmanday this week as scheduled and as discussed  F/U neurological evaluation. May consider PNCV/EMG studies and other studies pending follow-up evaluations. We will avoid such studies at this time F/U GI evaluation as planned  F/U psych evaluation Please follow-up with Dr. Kasandra Knudsen as discussed  May consider radiofrequency rhizolysis or intraspinal procedures pending response to present treatment and F/U evaluation . The present time we will avoid such treatment  Patient to call Pain Management Center should patient have concerns prior to scheduled return appointment.  Anxiety/depression

## 2015-10-01 ENCOUNTER — Inpatient Hospital Stay: Payer: Medicare Other

## 2015-10-01 ENCOUNTER — Inpatient Hospital Stay (HOSPITAL_BASED_OUTPATIENT_CLINIC_OR_DEPARTMENT_OTHER): Payer: Medicare Other | Admitting: Internal Medicine

## 2015-10-01 VITALS — BP 114/76 | HR 76 | Temp 97.8°F | Resp 19 | Ht 65.0 in

## 2015-10-01 DIAGNOSIS — R1110 Vomiting, unspecified: Secondary | ICD-10-CM

## 2015-10-01 DIAGNOSIS — C548 Malignant neoplasm of overlapping sites of corpus uteri: Secondary | ICD-10-CM

## 2015-10-01 DIAGNOSIS — E669 Obesity, unspecified: Secondary | ICD-10-CM

## 2015-10-01 DIAGNOSIS — C787 Secondary malignant neoplasm of liver and intrahepatic bile duct: Secondary | ICD-10-CM | POA: Diagnosis not present

## 2015-10-01 DIAGNOSIS — Z87891 Personal history of nicotine dependence: Secondary | ICD-10-CM

## 2015-10-01 DIAGNOSIS — Z7982 Long term (current) use of aspirin: Secondary | ICD-10-CM

## 2015-10-01 DIAGNOSIS — Z79899 Other long term (current) drug therapy: Secondary | ICD-10-CM

## 2015-10-01 DIAGNOSIS — M5136 Other intervertebral disc degeneration, lumbar region: Secondary | ICD-10-CM

## 2015-10-01 DIAGNOSIS — C7800 Secondary malignant neoplasm of unspecified lung: Secondary | ICD-10-CM

## 2015-10-01 DIAGNOSIS — C540 Malignant neoplasm of isthmus uteri: Secondary | ICD-10-CM

## 2015-10-01 DIAGNOSIS — N2889 Other specified disorders of kidney and ureter: Secondary | ICD-10-CM

## 2015-10-01 DIAGNOSIS — G4700 Insomnia, unspecified: Secondary | ICD-10-CM

## 2015-10-01 DIAGNOSIS — E119 Type 2 diabetes mellitus without complications: Secondary | ICD-10-CM

## 2015-10-01 DIAGNOSIS — G629 Polyneuropathy, unspecified: Secondary | ICD-10-CM

## 2015-10-01 DIAGNOSIS — R339 Retention of urine, unspecified: Secondary | ICD-10-CM

## 2015-10-01 DIAGNOSIS — N1330 Unspecified hydronephrosis: Secondary | ICD-10-CM

## 2015-10-01 DIAGNOSIS — F329 Major depressive disorder, single episode, unspecified: Secondary | ICD-10-CM

## 2015-10-01 DIAGNOSIS — E785 Hyperlipidemia, unspecified: Secondary | ICD-10-CM

## 2015-10-01 DIAGNOSIS — I10 Essential (primary) hypertension: Secondary | ICD-10-CM

## 2015-10-01 DIAGNOSIS — G893 Neoplasm related pain (acute) (chronic): Secondary | ICD-10-CM

## 2015-10-01 LAB — CBC WITH DIFFERENTIAL/PLATELET
BASOS PCT: 0 %
Basophils Absolute: 0 10*3/uL (ref 0–0.1)
EOS ABS: 0 10*3/uL (ref 0–0.7)
EOS PCT: 0 %
HEMATOCRIT: 28.3 % — AB (ref 35.0–47.0)
Hemoglobin: 9.7 g/dL — ABNORMAL LOW (ref 12.0–16.0)
Lymphocytes Relative: 17 %
Lymphs Abs: 1.6 10*3/uL (ref 1.0–3.6)
MCH: 28.9 pg (ref 26.0–34.0)
MCHC: 34.1 g/dL (ref 32.0–36.0)
MCV: 84.7 fL (ref 80.0–100.0)
MONO ABS: 1 10*3/uL — AB (ref 0.2–0.9)
MONOS PCT: 11 %
Neutro Abs: 6.9 10*3/uL — ABNORMAL HIGH (ref 1.4–6.5)
Neutrophils Relative %: 72 %
Platelets: 158 10*3/uL (ref 150–440)
RBC: 3.35 MIL/uL — ABNORMAL LOW (ref 3.80–5.20)
RDW: 13.5 % (ref 11.5–14.5)
WBC: 9.6 10*3/uL (ref 3.6–11.0)

## 2015-10-01 LAB — COMPREHENSIVE METABOLIC PANEL
ALBUMIN: 2.9 g/dL — AB (ref 3.5–5.0)
ALT: 22 U/L (ref 14–54)
ANION GAP: 9 (ref 5–15)
AST: 34 U/L (ref 15–41)
Alkaline Phosphatase: 84 U/L (ref 38–126)
BILIRUBIN TOTAL: 0.3 mg/dL (ref 0.3–1.2)
BUN: 8 mg/dL (ref 6–20)
CO2: 28 mmol/L (ref 22–32)
Calcium: 7.8 mg/dL — ABNORMAL LOW (ref 8.9–10.3)
Chloride: 93 mmol/L — ABNORMAL LOW (ref 101–111)
Creatinine, Ser: 0.91 mg/dL (ref 0.44–1.00)
GFR calc Af Amer: >60 mL/min (ref >60)
GFR calc non Af Amer: >60 mL/min (ref >60)
GLUCOSE: 231 mg/dL — AB (ref 65–99)
POTASSIUM: 3.5 mmol/L (ref 3.5–5.1)
SODIUM: 130 mmol/L — AB (ref 135–145)
TOTAL PROTEIN: 7.1 g/dL (ref 6.5–8.1)

## 2015-10-01 MED ORDER — MORPHINE SULFATE 2 MG/ML IJ SOLN
4.0000 mg | Freq: Once | INTRAMUSCULAR | Status: AC
  Administered 2015-10-01: 4 mg via INTRAVENOUS
  Filled 2015-10-01: qty 2

## 2015-10-01 MED ORDER — SODIUM CHLORIDE 0.9 % IV SOLN
25.0000 mg | Freq: Once | INTRAVENOUS | Status: AC
  Administered 2015-10-01: 25 mg via INTRAVENOUS
  Filled 2015-10-01 (×2): qty 2.5

## 2015-10-01 MED ORDER — HYDROMORPHONE HCL 2 MG PO TABS: 2.0000 mg | ORAL_TABLET | ORAL | 0 refills | Status: DC | PRN

## 2015-10-01 MED ORDER — PROCHLORPERAZINE MALEATE 10 MG PO TABS
10.0000 mg | ORAL_TABLET | Freq: Once | ORAL | Status: AC
Start: 2015-10-01 — End: 2015-10-01
  Administered 2015-10-01: 10 mg via ORAL
  Filled 2015-10-01: qty 1

## 2015-10-01 MED ORDER — SODIUM CHLORIDE 0.9 % IV SOLN
Freq: Once | INTRAVENOUS | Status: AC
  Administered 2015-10-01: 11:00:00 via INTRAVENOUS
  Filled 2015-10-01: qty 1000

## 2015-10-01 MED ORDER — TAPENTADOL HCL ER 200 MG PO TB12: 1.0000 | ORAL_TABLET | Freq: Two times a day (BID) | ORAL | 0 refills | Status: DC

## 2015-10-01 MED ORDER — DIPHENHYDRAMINE HCL 50 MG/ML IJ SOLN
12.5000 mg | Freq: Once | INTRAMUSCULAR | Status: AC
  Administered 2015-10-01: 12.5 mg via INTRAVENOUS
  Filled 2015-10-01: qty 1

## 2015-10-01 MED ORDER — TAPENTADOL HCL ER 150 MG PO TB12: ORAL_TABLET | ORAL | 0 refills | Status: DC

## 2015-10-01 NOTE — Progress Notes (Signed)
Wheelchair for Pain

## 2015-10-01 NOTE — Progress Notes (Signed)
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 Patient also had urinary retention- has Foley catheter placed. She denies any blood in urine. Patient complains of significant pain- abdomen/lower right upper quadrant. States her current pain regimen is not helping. She is on Nucyenta 15 BID; and oxycodone.   Denies any unusual shortness of breath or chest pain. Patient has chronic mild tingling and numbness of extremities. No nausea no vomiting. Positive for constipation. Passing gas.  ROS: A complete 10 point review of system is done which is negative except mentioned above in history of present illness  MEDICAL HISTORY:  Past Medical History:  Diagnosis Date  . Asthma   . Back pain   . Bladder absent    unable to urination- in hospital  . Cancer [facility])   . DDD (degenerative disc disease), lumbar   . Depression   . Diabetes mellitus without complication ([place])   . Hyperlipidemia   .  Hypertension   . Insomnia   . Menopausal syndrome   . Obesity   . Onychomycosis   . Peripheral neuropathy ([place])   . Renal insufficiency     SURGICAL HISTORY: Past Surgical History:  Procedure Laterality Date  . COSMETIC SURGERY    . JOINT REPLACEMENT Bilateral [id number]  . LIVER BIOPSY    . REDUCTION MAMMAPLASTY Bilateral 12/15/2013  . SHOULDER SURGERY Left     SOCIAL HISTORY: [place] Heal; live by self; smoking; no alcohol. She has children/family support. Social History   Social History  . Marital status: Single    Spouse name: N/A  . Number of children: N/A  . Years of education: N/A   Occupational History  . Not on file.   Social History Main Topics  . Smoking status: Former Smoker    Quit date: 08/19/2015  . Smokeless tobacco: Never Used  . Alcohol use No  . Drug use: No  . Sexual activity: Not on file   Other Topics Concern  . Not on file   Social History Narrative  . No narrative on file    FAMILY HISTORY: Family History  Problem Relation Age of Onset  . COPD Mother   . Sleep apnea Mother   . Alcohol abuse Mother   . Depression Mother   . Stroke Mother   . Heart disease Father   . Heart disease Sister     ALLERGIES:  is allergic to iodides; iodine; shellfish allergy; shellfish-derived products; iodinated diagnostic agents; povidone iodine; and povidone-iodine.  MEDICATIONS:  Current Outpatient Prescriptions  Medication Sig Dispense Refill  . alprazolam (XANAX) 2 MG tablet Take 2 mg by mouth 2 (two) times daily as needed for anxiety.     Marland Kitchen aspirin EC 81 MG tablet Take 81 mg by mouth every morning.     Marland Kitchen buPROPion (WELLBUTRIN SR) 150 MG 12 hr tablet Take 150 mg by mouth daily.    Marland Kitchen gabapentin (NEURONTIN) 600 MG tablet Limit 1 tablet by mouth twice per day if tolerated 60 tablet 0  . hydrochlorothiazide (HYDRODIURIL) 25 MG tablet Take 25 mg by mouth every morning.     . lactulose (CHRONULAC) 10 GM/15ML solution Take 30 g by mouth 2 (two) times daily as  needed for mild constipation or moderate constipation.     Marland Kitchen lisinopril (PRINIVIL,ZESTRIL) 10 MG tablet Take 10 mg by mouth every morning.     . metFORMIN (GLUCOPHAGE-XR) 500 MG 24 hr tablet Take 500 mg by mouth daily with breakfast.    . oxybutynin (DITROPAN XL) 10 MG 24 hr tablet Take 1 tablet (10 mg total) by mouth daily. 15 tablet 0  . Oxycodone HCl 10 MG TABS Take 1 -2 tablets every 3 - 4 hours as needed for pain 45 tablet 0  . polyethylene glycol (MIRALAX / GLYCOLAX) packet Take 17 g by mouth 2 (two) times daily. 14 each 0  . Potassium Chloride ER 20 MEQ TBCR Take 20 mEq by mouth daily. 60 tablet 0  . pravastatin (PRAVACHOL) 40 MG tablet Take 40 mg by mouth at bedtime.     . senna-docusate (SENOKOT-S) 8.6-50 MG tablet Take 2 tablets by mouth daily. (Patient taking differently: Take 2 tablets by mouth every morning. ) 60 tablet 0  . silver sulfADIAZINE (SILVADENE) 1 % cream Apply topically daily. (Patient taking differently: Apply 1 application topically daily. ) 50 g 0  . tiZANidine (ZANAFLEX) 2 MG tablet Limit 1 tablet by mouth 2-4 times per day if tolerated 120 tablet 0  . traZODone (DESYREL) 150 MG tablet Take 300 mg by mouth at bedtime as needed for sleep.     . Vilazodone HCl (VIIBRYD) 20 MG TABS Take 20 mg by mouth daily. Takes with 40mg  for total of 60mg  daily    . Vilazodone HCl (VIIBRYD) 40 MG TABS Take 40 mg by mouth daily. Takes with 20mg  for total of 60mg  daily    . HYDROmorphone (DILAUDID) 2 MG tablet Take 1 tablet (2 mg total) by mouth every 3 (three) hours as needed for severe pain. 120 tablet 0  . Tapentadol HCl (NUCYNTA ER) 200 MG TB12 Take 1 tablet by mouth every 12 (twelve) hours. 30 tablet 0   No current facility-administered medications for this visit.       Marland Kitchen  PHYSICAL EXAMINATION: ECOG PERFORMANCE STATUS: 2 - Symptomatic, <50% confined to bed  Vitals:   10/01/15 0944  BP: 114/76  Pulse: 76  Resp: 19  Temp: 97.8 F (36.6 C)   Filed Weights    GENERAL:  Well-nourished well-developed; Alert, Moderate distress complaining of worsening abdominal pain. Chronic Accompanied by friend. She is in a wheelchair. Obese.  EYES: no pallor or icterus OROPHARYNX: no thrush or ulceration;  NECK: supple, no masses felt LYMPH:  no palpable lymphadenopathy in the cervical, axillary or inguinal regions LUNGS: clear to auscultation and  No wheeze or crackles HEART/CVS: regular rate & rhythm and no murmurs; No lower extremity edema ABDOMEN: abdomen soft, non-tender and normal bowel sounds Musculoskeletal:no cyanosis of digits and no  clubbing  PSYCH: drowsy-but easily arousable & oriented x 3 NEURO: no focal motor/sensory deficits SKIN:  no rashes or significant lesions  LABORATORY DATA:  I have reviewed the data as listed Lab Results  Component Value Date   WBC 9.6 10/01/2015   HGB 9.7 (L) 10/01/2015   HCT 28.3 (L) 10/01/2015   MCV 84.7 10/01/2015   PLT 158 10/01/2015    Recent Labs  09/17/15 0858 09/19/15 0737  09/24/15 0915 09/25/15 2119 10/01/15 0908  NA 132* 135  < > 132* 134* 130*  K 3.5 3.1*  < > 2.9* 3.1* 3.5  CL 90* 94*  < > 94* 94* 93*  CO2 30 31  < > 29 23 28   GLUCOSE 235* 190*  < > 218* 216* 231*  BUN 12 12  < > 11 12 8   CREATININE 1.09* 0.93  < > 0.94 1.18* 0.91  CALCIUM 8.5* 8.6*  < > 8.2* 8.9 7.8*  GFRNONAA 56* >60  < > >60 51* >60  GFRAA >60 >60  < > >60 59* >60  PROT 8.6* 8.1  --   --   --  7.1  ALBUMIN 3.4* 3.2*  --   --   --  2.9*  AST 30 29  --   --   --  34  ALT 20 22  --   --   --  22  ALKPHOS 100 91  --   --   --  84  BILITOT 0.3 0.7  --   --   --  0.3  < > = values in this interval not displayed.  RADIOGRAPHIC STUDIES: I have personally reviewed the radiological images as listed and agreed with the findings in the report. Ct Abdomen Pelvis Wo Contrast  Result Date: 09/19/2015 CLINICAL DATA:  Lower abdominal pain EXAM: CT ABDOMEN AND PELVIS WITHOUT CONTRAST TECHNIQUE: Multidetector CT imaging of the abdomen and  pelvis was performed following the standard protocol without IV contrast. COMPARISON:  09/05/2005 FINDINGS: Innumerable bilateral pulmonary nodules are stable Innumerable lesions throughout the liver are stable. Spleen, pancreas, adrenal glands, gallbladder are within normal limits Bilateral hydronephrosis is stable. Markedly enlarged uterus consistent with the patient's known malignancy has grown in size. On sagittal reconstruction imaging, the uterus measures 9.7 x 16.9 cm. Previously, based on my measurements, it measured 9.0 x 14 cm. It extends into the lower abdomen. No evidence of small-bowel obstruction. Appendix is within normal limits. The rectum is somewhat difficult to visualize. There is suspected wall thickening of the upper rectum. See image 83 of series 7. This may represent spread of malignancy or possibly an inflammatory process. Foley catheter decompresses the bladder. There is no free fluid. No vertebral compression deformity. IMPRESSION: Over the course of 2 weeks, there has been in increasing size of the uterus worrisome for worsening malignancy. Metastatic disease in the liver and lung bases is stable. There is wall thickening of the rectum representing either spread of tumor or possibly an inflammatory process. Foley catheter decompresses the bladder. Hydronephrosis persists and is likely related to compression of the ureters from the enlarged uterus. Electronically Signed   By: Marybelle Killings M.D.   On: 09/19/2015 10:09   Ct Abdomen Pelvis Wo Contrast  Result Date: 09/06/2015 CLINICAL DATA:  Lower abdominal pain about the mid umbilicus for 3 days, worsening. History of metastatic uterine carcinoma. EXAM: CT ABDOMEN AND PELVIS WITHOUT CONTRAST TECHNIQUE: Multidetector CT imaging of the abdomen and pelvis was performed following the standard protocol without  IV contrast. COMPARISON:  CT abdomen and pelvis 08/23/2015 and 07/23/2014. FINDINGS: Innumerable pulmonary nodules are seen in the lower  lobes bilaterally consistent with metastatic disease. Largest nodule in the right lower lobe measures 1.6 cm, unchanged. A nodule in the left lower lobe which had measured 1.0 cm in diameter today measures 1.5 cm on image 8. Several small new lesions are identified. No pleural or pericardial effusion. Innumerable hepatic metastases are identified as on the prior examinations. Index lesion in the right hepatic lobe measuring 3.1 cm on image 19 is unchanged since the most recent study. A second index lesion near the dome of the liver on image 9 measures 1.6 cm compared to 1.0 cm on the prior exam. The spleen, adrenal glands and pancreas appear normal. Since the prior study, the patient has developed moderate bilateral hydronephrosis, worse on the left. Cause for the obstruction is the patient's markedly enlarged uterus consistent with history of carcinoma. The uterus today measures 13.5 x 13.2 cm in the axial plane on image 72 compared to 11.5 x 12.7 cm on the prior examination. There is mass effect on the urinary bladder from the uterus. Calcified fibroids are noted. No lymphadenopathy is identified. Previously seen soft tissue density along the posterior aspect of the uterus on the left can no longer be separately identified. No evidence of bowel obstruction is seen. The rectosigmoid colon is difficult to visualize due to the patient's uterine mass. No fluid collection is identified. No lytic or sclerotic bony lesion is seen. IMPRESSION: Progressive metastatic uterine carcinoma with increase in the size and number of pulmonary nodules and increase the size of the hepatic metastases. Moderate bilateral hydronephrosis due to obstruction from the patient's uterine mass is new since the CT 2 weeks ago. Electronically Signed   By: Inge Rise M.D.   On: 09/06/2015 21:28   Ct Renal Stone Study  Result Date: 09/26/2015 CLINICAL DATA:  Acute onset of urinary retention. Patient on chemotherapy for uterine cancer.  Initial encounter. EXAM: CT ABDOMEN AND PELVIS WITHOUT CONTRAST TECHNIQUE: Multidetector CT imaging of the abdomen and pelvis was performed following the standard protocol without IV contrast. COMPARISON:  CT of the abdomen and pelvis performed 09/19/2015 FINDINGS: Numerous nodules of varying size are noted throughout the lung bases, compatible with metastatic disease. Trace pericardial fluid remains within normal limits. Numerous prominent masses are seen throughout the liver, compatible with metastatic disease. The spleen is unremarkable in appearance. There is suggestion of a 5 mm stone at the distal common bile duct, without evidence of dilatation of the common bile duct. The pancreas is grossly unremarkable in appearance. The gallbladder is within normal limits. The adrenal glands are unremarkable. Moderate bilateral hydronephrosis is again noted, with diffuse dilatation of both ureters. This appears to reflect obstruction due to the large pelvic mass. No perinephric stranding is seen. No nonobstructing renal stones are identified. No free fluid is identified. The small bowel is unremarkable in appearance. The stomach is within normal limits. No acute vascular abnormalities are seen. The appendix is normal in caliber, without evidence of appendicitis. The proximal colon is grossly unremarkable in appearance, though the sigmoid colon is not well assessed due to surrounding mass. There appears to be diffuse infiltration of the rectum from the large pelvic mass. The bladder is is not well characterized due to the overlying mass. The uterus is diffusely and irregularly enlarged, measuring 16.7 x 15.6 x 10.0 cm, reflecting an underlying uterine mass. Associated soft tissue inflammation and trace free  fluid is seen. No inguinal lymphadenopathy is seen. No acute osseous abnormalities are identified. Bilateral hip arthroplasties are grossly unremarkable in appearance, though incompletely imaged. IMPRESSION: 1. Diffuse  irregular enlargement of the uterus, measuring 16.7 x 15.6 x 10.0 cm, similar in appearance to the recent prior study and reflecting underlying uterine mass. Associated soft tissue inflammation and trace free fluid noted, mildly more prominent than on the prior study. 2. Apparent diffuse infiltration of the rectum from the large pelvic mass; the sigmoid colon is not well assessed due to surrounding mass. 3. Moderate bilateral hydronephrosis, with diffuse dilatation of both ureters. This appears to reflect obstruction due to the large pelvic mass. 4. Numerous prominent masses throughout the liver, compatible with metastatic disease. 5. Numerous nodules of varying size throughout the lung bases, reflecting metastatic disease. 6. Suggestion of a 5 mm stone at the distal common bile duct, without evidence of dilatation of the common bile duct. Electronically Signed   By: Garald Balding M.D.   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 Stage IV- on palliative treatments with Torisel  s/p 4 treatments; Most recent scan August 2017 shows-stable liver lesions/increasing uterine mass.    #  proceed with # 5 today.Tolerating well. Labs are okay. No major side effects noted.   # Chronic pain-worsening; likely from progressive malignancy. recommend continue Nucynta 200 twice a day ; add dilaudid 2 mg every 3 hr. prescription for 2 weeks given. Discussed with Dr. Delfin Gant the progressive malignancy related pain;  we will manage the pain. We will give morphine IV today.  # Ph- [facility] walker- [phone] home health aide. Left message.   #  Patient will follow-up with me with labs treatment in 2w;  Follow-up next week for lab/ treatment.Poor prognosis.   # 25 minutes face-to-face with the patient discussing the above plan of care; more than 50% of time spent on prognosis/  natural history; counseling and coordination.     Cammie Sickle, MD 10/01/2015 6:01 PM

## 2015-10-01 NOTE — Progress Notes (Signed)
Pt in 10/10 pain in abdomen and "bottom on the inside per pt" .  Pain is pt's main concern.

## 2015-10-01 NOTE — Assessment & Plan Note (Addendum)
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 Stage IV- on palliative treatments with Torisel  s/p 4 treatments; Most recent scan August 2017 shows-stable liver lesions/increasing uterine mass.    #  proceed with # 5 today.Tolerating well. Labs are okay. No major side effects noted.   # Chronic pain-worsening; likely from progressive malignancy. recommend continue Nucynta 200 twice a day ; add dilaudid 2 mg every 3 hr. prescription for 2 weeks given. Discussed with Dr. Delfin Gant the progressive malignancy related pain;  we will manage the pain. We will give morphine IV today.  # Ph- [facility] walk[facility]- [phone] home health aide. Left message.   #  Patient will follow-up with me with labs treatment in 2w;  Follow-up next week for lab/ treatment.Poor prognosis.

## 2015-10-06 ENCOUNTER — Ambulatory Visit: Payer: Medicare Other | Admitting: Urology

## 2015-10-06 ENCOUNTER — Other Ambulatory Visit: Payer: Self-pay | Admitting: Pain Medicine

## 2015-10-08 ENCOUNTER — Other Ambulatory Visit: Payer: Self-pay | Admitting: Internal Medicine

## 2015-10-08 ENCOUNTER — Inpatient Hospital Stay: Payer: Medicare Other | Attending: Internal Medicine

## 2015-10-08 ENCOUNTER — Inpatient Hospital Stay: Payer: Medicare Other

## 2015-10-08 VITALS — BP 146/100 | HR 103 | Temp 98.1°F | Resp 20

## 2015-10-08 DIAGNOSIS — I10 Essential (primary) hypertension: Secondary | ICD-10-CM | POA: Diagnosis not present

## 2015-10-08 DIAGNOSIS — Z9181 History of falling: Secondary | ICD-10-CM | POA: Insufficient documentation

## 2015-10-08 DIAGNOSIS — M21371 Foot drop, right foot: Secondary | ICD-10-CM | POA: Diagnosis not present

## 2015-10-08 DIAGNOSIS — K5900 Constipation, unspecified: Secondary | ICD-10-CM | POA: Insufficient documentation

## 2015-10-08 DIAGNOSIS — Z87891 Personal history of nicotine dependence: Secondary | ICD-10-CM | POA: Insufficient documentation

## 2015-10-08 DIAGNOSIS — M5136 Other intervertebral disc degeneration, lumbar region: Secondary | ICD-10-CM | POA: Insufficient documentation

## 2015-10-08 DIAGNOSIS — G629 Polyneuropathy, unspecified: Secondary | ICD-10-CM | POA: Insufficient documentation

## 2015-10-08 DIAGNOSIS — E669 Obesity, unspecified: Secondary | ICD-10-CM | POA: Insufficient documentation

## 2015-10-08 DIAGNOSIS — Z5111 Encounter for antineoplastic chemotherapy: Secondary | ICD-10-CM | POA: Diagnosis not present

## 2015-10-08 DIAGNOSIS — R109 Unspecified abdominal pain: Secondary | ICD-10-CM | POA: Diagnosis not present

## 2015-10-08 DIAGNOSIS — R339 Retention of urine, unspecified: Secondary | ICD-10-CM | POA: Insufficient documentation

## 2015-10-08 DIAGNOSIS — G4700 Insomnia, unspecified: Secondary | ICD-10-CM | POA: Diagnosis not present

## 2015-10-08 DIAGNOSIS — E876 Hypokalemia: Secondary | ICD-10-CM

## 2015-10-08 DIAGNOSIS — C787 Secondary malignant neoplasm of liver and intrahepatic bile duct: Secondary | ICD-10-CM | POA: Diagnosis not present

## 2015-10-08 DIAGNOSIS — E119 Type 2 diabetes mellitus without complications: Secondary | ICD-10-CM | POA: Diagnosis not present

## 2015-10-08 DIAGNOSIS — E785 Hyperlipidemia, unspecified: Secondary | ICD-10-CM | POA: Insufficient documentation

## 2015-10-08 DIAGNOSIS — C548 Malignant neoplasm of overlapping sites of corpus uteri: Secondary | ICD-10-CM | POA: Diagnosis present

## 2015-10-08 DIAGNOSIS — N1330 Unspecified hydronephrosis: Secondary | ICD-10-CM | POA: Insufficient documentation

## 2015-10-08 DIAGNOSIS — Z79899 Other long term (current) drug therapy: Secondary | ICD-10-CM | POA: Insufficient documentation

## 2015-10-08 DIAGNOSIS — N2889 Other specified disorders of kidney and ureter: Secondary | ICD-10-CM | POA: Diagnosis not present

## 2015-10-08 DIAGNOSIS — N3289 Other specified disorders of bladder: Secondary | ICD-10-CM | POA: Diagnosis not present

## 2015-10-08 DIAGNOSIS — M549 Dorsalgia, unspecified: Secondary | ICD-10-CM | POA: Insufficient documentation

## 2015-10-08 DIAGNOSIS — C7800 Secondary malignant neoplasm of unspecified lung: Secondary | ICD-10-CM | POA: Diagnosis not present

## 2015-10-08 DIAGNOSIS — F329 Major depressive disorder, single episode, unspecified: Secondary | ICD-10-CM | POA: Insufficient documentation

## 2015-10-08 DIAGNOSIS — Z7982 Long term (current) use of aspirin: Secondary | ICD-10-CM | POA: Insufficient documentation

## 2015-10-08 LAB — BASIC METABOLIC PANEL
ANION GAP: 13 (ref 5–15)
BUN: 11 mg/dL (ref 6–20)
CALCIUM: 8.9 mg/dL (ref 8.9–10.3)
CO2: 26 mmol/L (ref 22–32)
Chloride: 100 mmol/L — ABNORMAL LOW (ref 101–111)
Creatinine, Ser: 1.21 mg/dL — ABNORMAL HIGH (ref 0.44–1.00)
GFR, EST AFRICAN AMERICAN: 57 mL/min — AB (ref >60)
GFR, EST NON AFRICAN AMERICAN: 49 mL/min — AB (ref >60)
Glucose, Bld: 193 mg/dL — ABNORMAL HIGH (ref 65–99)
POTASSIUM: 2.8 mmol/L — AB (ref 3.5–5.1)
Sodium: 139 mmol/L (ref 135–145)

## 2015-10-08 LAB — CBC WITH DIFFERENTIAL/PLATELET
BASOS ABS: 0.1 10*3/uL (ref 0–0.1)
BASOS PCT: 1 %
EOS PCT: 0 %
Eosinophils Absolute: 0 10*3/uL (ref 0–0.7)
HCT: 33.9 % — ABNORMAL LOW (ref 35.0–47.0)
Hemoglobin: 11 g/dL — ABNORMAL LOW (ref 12.0–16.0)
LYMPHS PCT: 14 %
Lymphs Abs: 1.9 10*3/uL (ref 1.0–3.6)
MCH: 27.4 pg (ref 26.0–34.0)
MCHC: 32.4 g/dL (ref 32.0–36.0)
MCV: 84.6 fL (ref 80.0–100.0)
MONO ABS: 0.9 10*3/uL (ref 0.2–0.9)
Monocytes Relative: 7 %
NEUTROS ABS: 10.5 10*3/uL — AB (ref 1.4–6.5)
Neutrophils Relative %: 78 %
PLATELETS: 268 10*3/uL (ref 150–440)
RBC: 4.01 MIL/uL (ref 3.80–5.20)
RDW: 14 % (ref 11.5–14.5)
WBC: 13.5 10*3/uL — AB (ref 3.6–11.0)

## 2015-10-08 MED ORDER — SODIUM CHLORIDE 0.9 % IV SOLN
20.0000 meq | Freq: Once | INTRAVENOUS | Status: AC
  Administered 2015-10-08: 20 meq via INTRAVENOUS
  Filled 2015-10-08: qty 10

## 2015-10-08 MED ORDER — PROCHLORPERAZINE MALEATE 10 MG PO TABS
10.0000 mg | ORAL_TABLET | Freq: Once | ORAL | Status: AC
  Administered 2015-10-08: 10 mg via ORAL
  Filled 2015-10-08: qty 1

## 2015-10-08 MED ORDER — POTASSIUM CHLORIDE CRYS ER 20 MEQ PO TBCR: EXTENDED_RELEASE_TABLET | ORAL | 3 refills | Status: DC

## 2015-10-08 MED ORDER — TEMSIROLIMUS CHEMO INJECTION 25 MG/ML W/DILUENT
25.0000 mg | Freq: Once | INTRAVENOUS | Status: AC
  Administered 2015-10-08: 25 mg via INTRAVENOUS
  Filled 2015-10-08: qty 2.5

## 2015-10-08 MED ORDER — DIPHENHYDRAMINE HCL 50 MG/ML IJ SOLN
12.5000 mg | Freq: Once | INTRAMUSCULAR | Status: AC
  Administered 2015-10-08: 12.5 mg via INTRAVENOUS
  Filled 2015-10-08: qty 1

## 2015-10-08 MED ORDER — SODIUM CHLORIDE 0.9 % IV SOLN
Freq: Once | INTRAVENOUS | Status: AC
  Administered 2015-10-08: 11:00:00 via INTRAVENOUS
  Filled 2015-10-08: qty 1000

## 2015-10-08 MED ORDER — SODIUM CHLORIDE 0.9 % IV SOLN: INTRAVENOUS | Status: DC | PRN

## 2015-10-08 NOTE — Progress Notes (Signed)
Dr. Rogue Bussing informed BP today is 146/100 and he says OK to continue treatment today.  Patient also having frequent bowel movements, not diarrhea consistency, MD informed.

## 2015-10-12 ENCOUNTER — Telehealth: Payer: Self-pay | Admitting: Internal Medicine

## 2015-10-12 ENCOUNTER — Encounter: Payer: Self-pay | Admitting: *Deleted

## 2015-10-12 ENCOUNTER — Telehealth: Payer: Self-pay | Admitting: *Deleted

## 2015-10-12 NOTE — Telephone Encounter (Signed)
Daughter left another vm asking for Dr. B to please call her back. She said she needs his guidance about a situation dealing with her mother. [phone].

## 2015-10-12 NOTE — Telephone Encounter (Signed)
Daughter's name is Fayrene Helper.

## 2015-10-12 NOTE — Telephone Encounter (Signed)
Daughter Phineas Real contacted Nira Conn, RN cancer center.  Pt has fallen multiple times over the weekend. Daughter requesting [facility] - She already had the pt's pmd write orders, but daughter requesting letter to be faxed to [facility] to expedite home health as home health nurse has not yet come out to home.  RN spoke with DR. Brahmanday. He will be glad to send a letter to [facility] "verifying that the patient has a cancer diagnosis; however, pcp needs to be the ordering provider."

## 2015-10-12 NOTE — Telephone Encounter (Signed)
Pt daughter left voicemail. She is not listed on patient's designated party release form. She said in her message that she has questions for Dr. Rogue Bussing. Please call: [phone]. Thank you.

## 2015-10-14 ENCOUNTER — Encounter: Payer: Self-pay | Admitting: Urology

## 2015-10-14 ENCOUNTER — Ambulatory Visit (INDEPENDENT_AMBULATORY_CARE_PROVIDER_SITE_OTHER): Payer: Medicare Other | Admitting: Urology

## 2015-10-14 VITALS — BP 123/89 | HR 108 | Ht 65.0 in

## 2015-10-14 DIAGNOSIS — N3289 Other specified disorders of bladder: Secondary | ICD-10-CM

## 2015-10-14 DIAGNOSIS — N1330 Unspecified hydronephrosis: Secondary | ICD-10-CM | POA: Diagnosis not present

## 2015-10-14 DIAGNOSIS — R339 Retention of urine, unspecified: Secondary | ICD-10-CM

## 2015-10-14 DIAGNOSIS — R110 Nausea: Secondary | ICD-10-CM | POA: Diagnosis not present

## 2015-10-14 MED ORDER — PROMETHAZINE HCL 25 MG/ML IJ SOLN
25.0000 mg | Freq: Once | INTRAMUSCULAR | Status: AC
  Administered 2015-10-14: 25 mg via INTRAMUSCULAR

## 2015-10-14 MED ORDER — OXYBUTYNIN CHLORIDE 5 MG PO TABS: 5.0000 mg | ORAL_TABLET | Freq: Three times a day (TID) | ORAL | 3 refills | Status: AC | PRN

## 2015-10-14 NOTE — Telephone Encounter (Signed)
Letter faxed on 10/13/15 per md order to [facility]

## 2015-10-14 NOTE — Progress Notes (Signed)
IM Injection  Patient is present today for an IM Injection for treatment of nuasea Drug: Promethazine Dose:25mg /ml Location:Right upper outer buttocks Lot: 12/[id number] Patient tolerated well, no complications were noted  Preformed by: Fonnie Jarvis, CMA

## 2015-10-15 ENCOUNTER — Inpatient Hospital Stay (HOSPITAL_BASED_OUTPATIENT_CLINIC_OR_DEPARTMENT_OTHER): Payer: Medicare Other | Admitting: Internal Medicine

## 2015-10-15 ENCOUNTER — Inpatient Hospital Stay: Payer: Medicare Other

## 2015-10-15 ENCOUNTER — Telehealth: Payer: Self-pay | Admitting: *Deleted

## 2015-10-15 ENCOUNTER — Other Ambulatory Visit: Payer: Self-pay

## 2015-10-15 ENCOUNTER — Telehealth: Payer: Self-pay | Admitting: Internal Medicine

## 2015-10-15 VITALS — BP 116/76 | HR 111 | Temp 99.1°F | Resp 22 | Ht 65.0 in | Wt 286.0 lb

## 2015-10-15 DIAGNOSIS — Z79899 Other long term (current) drug therapy: Secondary | ICD-10-CM

## 2015-10-15 DIAGNOSIS — C548 Malignant neoplasm of overlapping sites of corpus uteri: Secondary | ICD-10-CM

## 2015-10-15 DIAGNOSIS — N3289 Other specified disorders of bladder: Secondary | ICD-10-CM

## 2015-10-15 DIAGNOSIS — Z9181 History of falling: Secondary | ICD-10-CM

## 2015-10-15 DIAGNOSIS — R339 Retention of urine, unspecified: Secondary | ICD-10-CM

## 2015-10-15 DIAGNOSIS — G629 Polyneuropathy, unspecified: Secondary | ICD-10-CM

## 2015-10-15 DIAGNOSIS — M5136 Other intervertebral disc degeneration, lumbar region: Secondary | ICD-10-CM

## 2015-10-15 DIAGNOSIS — C787 Secondary malignant neoplasm of liver and intrahepatic bile duct: Secondary | ICD-10-CM

## 2015-10-15 DIAGNOSIS — F411 Generalized anxiety disorder: Secondary | ICD-10-CM

## 2015-10-15 DIAGNOSIS — F32A Depression, unspecified: Secondary | ICD-10-CM

## 2015-10-15 DIAGNOSIS — M549 Dorsalgia, unspecified: Secondary | ICD-10-CM

## 2015-10-15 DIAGNOSIS — E876 Hypokalemia: Secondary | ICD-10-CM | POA: Diagnosis not present

## 2015-10-15 DIAGNOSIS — K5900 Constipation, unspecified: Secondary | ICD-10-CM

## 2015-10-15 DIAGNOSIS — G4700 Insomnia, unspecified: Secondary | ICD-10-CM

## 2015-10-15 DIAGNOSIS — C7800 Secondary malignant neoplasm of unspecified lung: Secondary | ICD-10-CM | POA: Diagnosis not present

## 2015-10-15 DIAGNOSIS — Z7982 Long term (current) use of aspirin: Secondary | ICD-10-CM

## 2015-10-15 DIAGNOSIS — F329 Major depressive disorder, single episode, unspecified: Secondary | ICD-10-CM

## 2015-10-15 DIAGNOSIS — M21371 Foot drop, right foot: Secondary | ICD-10-CM

## 2015-10-15 DIAGNOSIS — N1330 Unspecified hydronephrosis: Secondary | ICD-10-CM

## 2015-10-15 DIAGNOSIS — E669 Obesity, unspecified: Secondary | ICD-10-CM

## 2015-10-15 DIAGNOSIS — N2889 Other specified disorders of kidney and ureter: Secondary | ICD-10-CM

## 2015-10-15 DIAGNOSIS — Z87891 Personal history of nicotine dependence: Secondary | ICD-10-CM

## 2015-10-15 DIAGNOSIS — R109 Unspecified abdominal pain: Secondary | ICD-10-CM

## 2015-10-15 DIAGNOSIS — E785 Hyperlipidemia, unspecified: Secondary | ICD-10-CM

## 2015-10-15 DIAGNOSIS — I10 Essential (primary) hypertension: Secondary | ICD-10-CM

## 2015-10-15 DIAGNOSIS — E119 Type 2 diabetes mellitus without complications: Secondary | ICD-10-CM

## 2015-10-15 LAB — CBC WITH DIFFERENTIAL/PLATELET
Basophils Absolute: 0.1 10*3/uL (ref 0–0.1)
Basophils Relative: 1 %
EOS PCT: 1 %
Eosinophils Absolute: 0.1 10*3/uL (ref 0–0.7)
HEMATOCRIT: 33.9 % — AB (ref 35.0–47.0)
Hemoglobin: 11.2 g/dL — ABNORMAL LOW (ref 12.0–16.0)
LYMPHS PCT: 23 %
Lymphs Abs: 2.7 10*3/uL (ref 1.0–3.6)
MCH: 27.3 pg (ref 26.0–34.0)
MCHC: 33 g/dL (ref 32.0–36.0)
MCV: 82.5 fL (ref 80.0–100.0)
MONO ABS: 1.2 10*3/uL — AB (ref 0.2–0.9)
MONOS PCT: 11 %
NEUTROS ABS: 7.4 10*3/uL — AB (ref 1.4–6.5)
Neutrophils Relative %: 64 %
Platelets: 207 10*3/uL (ref 150–440)
RBC: 4.11 MIL/uL (ref 3.80–5.20)
RDW: 14.4 % (ref 11.5–14.5)
WBC: 11.4 10*3/uL — ABNORMAL HIGH (ref 3.6–11.0)

## 2015-10-15 LAB — COMPREHENSIVE METABOLIC PANEL
ALT: 24 U/L (ref 14–54)
ANION GAP: 13 (ref 5–15)
AST: 34 U/L (ref 15–41)
Albumin: 3.3 g/dL — ABNORMAL LOW (ref 3.5–5.0)
Alkaline Phosphatase: 102 U/L (ref 38–126)
BILIRUBIN TOTAL: 0.9 mg/dL (ref 0.3–1.2)
BUN: 10 mg/dL (ref 6–20)
CO2: 27 mmol/L (ref 22–32)
Calcium: 8.3 mg/dL — ABNORMAL LOW (ref 8.9–10.3)
Chloride: 95 mmol/L — ABNORMAL LOW (ref 101–111)
Creatinine, Ser: 0.85 mg/dL (ref 0.44–1.00)
Glucose, Bld: 197 mg/dL — ABNORMAL HIGH (ref 65–99)
POTASSIUM: 2.5 mmol/L — AB (ref 3.5–5.1)
Sodium: 135 mmol/L (ref 135–145)
TOTAL PROTEIN: 8.1 g/dL (ref 6.5–8.1)

## 2015-10-15 MED ORDER — BUPROPION HCL ER (SR) 150 MG PO TB12: 150.0000 mg | ORAL_TABLET | Freq: Every day | ORAL | 3 refills | Status: AC

## 2015-10-15 MED ORDER — HYDROMORPHONE HCL 2 MG PO TABS: 2.0000 mg | ORAL_TABLET | ORAL | 0 refills | Status: DC | PRN

## 2015-10-15 MED ORDER — TAPENTADOL HCL ER 200 MG PO TB12: 1.0000 | ORAL_TABLET | Freq: Two times a day (BID) | ORAL | 0 refills | Status: DC

## 2015-10-15 MED ORDER — SODIUM CHLORIDE 0.9 % IV SOLN
Freq: Once | INTRAVENOUS | Status: AC
  Administered 2015-10-15: 12:00:00 via INTRAVENOUS
  Filled 2015-10-15: qty 100

## 2015-10-15 MED ORDER — POTASSIUM CHLORIDE ER 20 MEQ PO TBCR: 1.0000 | EXTENDED_RELEASE_TABLET | Freq: Two times a day (BID) | ORAL | 6 refills | Status: AC

## 2015-10-15 MED ORDER — DIPHENHYDRAMINE HCL 50 MG/ML IJ SOLN: 12.5000 mg | Freq: Once | INTRAMUSCULAR | Status: DC

## 2015-10-15 MED ORDER — SILVER SULFADIAZINE 1 % EX CREA: TOPICAL_CREAM | Freq: Every day | CUTANEOUS | 3 refills | Status: AC

## 2015-10-15 MED ORDER — PROCHLORPERAZINE MALEATE 10 MG PO TABS: 10.0000 mg | ORAL_TABLET | Freq: Once | ORAL | Status: DC

## 2015-10-15 MED ORDER — VILAZODONE HCL 20 MG PO TABS: 20.0000 mg | ORAL_TABLET | Freq: Every day | ORAL | 3 refills | Status: AC

## 2015-10-15 MED ORDER — POTASSIUM CHLORIDE 10 MEQ/100ML IV SOLN: 10.0000 meq | Freq: Once | INTRAVENOUS | Status: DC

## 2015-10-15 MED ORDER — HEPARIN SOD (PORK) LOCK FLUSH 100 UNIT/ML IV SOLN: 500.0000 [IU] | Freq: Once | INTRAVENOUS | Status: DC | PRN

## 2015-10-15 MED ORDER — SODIUM CHLORIDE 0.9 % IV SOLN
25.0000 mg | Freq: Once | INTRAVENOUS | Status: DC
  Filled 2015-10-15: qty 2.5

## 2015-10-15 MED ORDER — SENNOSIDES-DOCUSATE SODIUM 8.6-50 MG PO TABS: 2.0000 | ORAL_TABLET | Freq: Every day | ORAL | 6 refills | Status: AC

## 2015-10-15 MED ORDER — GABAPENTIN 600 MG PO TABS: ORAL_TABLET | ORAL | 3 refills | Status: AC

## 2015-10-15 MED ORDER — SODIUM CHLORIDE 0.9 % IV SOLN
Freq: Once | INTRAVENOUS | Status: AC
  Administered 2015-10-15: 12:00:00 via INTRAVENOUS
  Filled 2015-10-15: qty 1000

## 2015-10-15 MED ORDER — VILAZODONE HCL 40 MG PO TABS: 40.0000 mg | ORAL_TABLET | Freq: Every day | ORAL | 3 refills | Status: AC

## 2015-10-15 MED ORDER — HYDROCHLOROTHIAZIDE 25 MG PO TABS: 25.0000 mg | ORAL_TABLET | ORAL | 3 refills | Status: DC

## 2015-10-15 NOTE — Progress Notes (Signed)
Patient stated that she did not want chemo treatment.  Patient only received potassium.  Dr. Rogue Bussing aware.  Patient to keep next appointment.

## 2015-10-15 NOTE — Progress Notes (Signed)
md spoke with pt and patient's friend. Will initiate a hospice referral for discussion with hospice nurses only to allow patient the opportunity to explore this option.  Critical value - potassium 2.5 level-- 1019 am called by Lanelle Bal in cancer ctr lab.   Read back process performed with tech and read back process performed with Dr. Rogue Bussing @1020   Verbal handoff given to Katharine Look, RN in infusion suite.

## 2015-10-15 NOTE — Assessment & Plan Note (Addendum)
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 Stage IV- on palliative treatments with Torisel  s/p 4 treatments; Most recent scan August 2017 shows-stable liver lesions/increasing uterine mass.    # Discussed continuing treatments- treatment #7 today. Patient declined [complains of nausea; length of stay etc.].   # Chronic pain-worsening; likely from progressive malignancy. recommend continue Nucynta 200 twice a day ; add dilaudid 2 mg every 3 hr. prescription for 2 weeks given.   # Severe hypokalemia- potassium 2.5; status post supplementation 10 IV; and also 40 twice a day at home  # Long discussion the patient and family regarding the overall poor prognosis; and I do not think patient is a good candidate for chemotherapy like gemcitabine and Taxotere given her poor performance status. She is at high risk for multiple side effects. Discussed at length.  # Patient's caregiver requests hospice evaluation given the decline in the patient's overall clinical situation. I think this is reasonable. We will initiate a hospice referral.  # Follow-up treatment in one week labs follow-up with me in 2 weeks labs.

## 2015-10-15 NOTE — Telephone Encounter (Signed)
Spoke to patient's daughter Annette [name]- overall doing poorly. Recommend continue treatment at this time. If declining/progressive disease hospice would be reasonable. Discussed that hospice referral has been made/ to discuss options; no decisions being made at this time.  Family interested in- home health referral.  Annette [name]- please initiate a home health referral for the patient.

## 2015-10-15 NOTE — Progress Notes (Signed)
[place] CONSULT NOTE  Patient Care Team: Ellamae Sia, MD as PCP - General (Internal Medicine) Clent Jacks, RN as Registered Nurse  CHIEF COMPLAINTS/PURPOSE OF CONSULTATION:  Oncology History   # July 2017 UTERINE PECOMA s/p Liver bx [Dr.Berhcuk]; Uterine mass; liver lesion;  lung nodules;  # July 28th 2017- TORISEL weekly  # 2002 & 2004 hip Surgery/chronic pain [Dr.Crsip]/Right foot drop- limited mobility/Obese    # [facility]- TSC-2 NOT DONE; NF-1 [mek-Inhibitors];MED/TP53**; TMB-Intermediate     Malignant neoplasm of overlapping sites of body of uterus ([place])   08/18/2015 Initial Diagnosis    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 She is currently status post 6 weekly treatments.  Patient has not had any admission the hospital. However she was seen by urology in the interim for urinary retention/bilateral hydronephrosis.   Patient continues to have n significant abdominal pain; increased bladder spasms. patient complains of significant pain- abdomen/lower right upper quadrant. She is on Nucyenta 200 BID; and oxycodone.   Denies any unusual shortness of breath or chest pain. Patient has chronic mild tingling and numbness of extremities. No nausea no vomiting. Positive for constipation. As per the patient's caregiver patient has been falling at home. Overall doing poorly.  ROS: A complete 10 point review of system is done which is negative except mentioned above in history of present illness  MEDICAL HISTORY:  Past Medical History:  Diagnosis Date  . Asthma   . Back pain   . Bladder absent    unable to urination- in hospital  . Cancer [facility])   . DDD (degenerative disc disease), lumbar   .  Depression   . Diabetes mellitus without complication ([place])   . Hyperlipidemia   . Hypertension   . Insomnia   . Menopausal syndrome   . Obesity   . Onychomycosis   . Peripheral neuropathy ([place])   . Renal insufficiency     SURGICAL HISTORY: Past Surgical History:  Procedure Laterality Date  . COSMETIC SURGERY    . JOINT REPLACEMENT Bilateral [id number]  . LIVER BIOPSY    . REDUCTION MAMMAPLASTY Bilateral 12/15/2013  . SHOULDER SURGERY Left     SOCIAL HISTORY: [place] Heal; live by self; smoking; no alcohol. She has children/family support. Social History   Social History  . Marital status: Single    Spouse name: N/A  . Number of children: N/A  . Years of education: N/A   Occupational History  . Not on file.   Social History Main Topics  . Smoking status: Former Smoker    Quit date: 08/19/2015  . Smokeless tobacco: Never Used  . Alcohol use No  . Drug use: No  . Sexual activity: Not on file   Other Topics Concern  . Not on file   Social History Narrative  . No narrative on file    FAMILY HISTORY: Family History  Problem Relation Age of Onset  . COPD Mother   . Sleep apnea Mother   . Alcohol abuse Mother   . Depression Mother   . Stroke Mother   . Heart disease Father   . Heart disease Sister   . Bladder Cancer Neg Hx   . Kidney cancer Neg  Hx     ALLERGIES:  is allergic to iodides; iodine; shellfish allergy; shellfish-derived products; iodinated diagnostic agents; povidone iodine; and povidone-iodine.  MEDICATIONS:  Current Outpatient Prescriptions  Medication Sig Dispense Refill  . alprazolam (XANAX) 2 MG tablet Take 2 mg by mouth 2 (two) times daily as needed for anxiety.     Marland Kitchen aspirin EC 81 MG tablet Take 81 mg by mouth every morning.     . gabapentin (NEURONTIN) 600 MG tablet Limit 1 tablet by mouth twice per day if tolerated 60 tablet 3  . hydrochlorothiazide (HYDRODIURIL) 25 MG tablet Take 1 tablet (25 mg total) by mouth every morning. 30 tablet 3   . lisinopril (PRINIVIL,ZESTRIL) 10 MG tablet Take 10 mg by mouth every morning.     . metFORMIN (GLUCOPHAGE-XR) 500 MG 24 hr tablet Take 500 mg by mouth daily with breakfast.    . oxybutynin (DITROPAN) 5 MG tablet Take 1 tablet (5 mg total) by mouth every 8 (eight) hours as needed for bladder spasms. 30 tablet 3  . polyethylene glycol (MIRALAX / GLYCOLAX) packet Take 17 g by mouth 2 (two) times daily. 14 each 0  . tiZANidine (ZANAFLEX) 2 MG tablet Limit 1 tablet by mouth 2-4 times per day if tolerated 120 tablet 0  . traZODone (DESYREL) 150 MG tablet Take 300 mg by mouth at bedtime as needed for sleep.     Marland Kitchen buPROPion (WELLBUTRIN SR) 150 MG 12 hr tablet Take 1 tablet (150 mg total) by mouth daily. 30 tablet 3  . HYDROmorphone (DILAUDID) 2 MG tablet Take 1 tablet (2 mg total) by mouth every 3 (three) hours as needed for severe pain. 120 tablet 0  . lactulose (CHRONULAC) 10 GM/15ML solution Take 30 g by mouth 2 (two) times daily as needed for mild constipation or moderate constipation.     . Oxycodone HCl 10 MG TABS Take 1 -2 tablets every 3 - 4 hours as needed for pain (Patient not taking: Reported on 10/15/2015) 45 tablet 0  . Potassium Chloride ER 20 MEQ TBCR Take 1 tablet by mouth 2 (two) times daily. 60 tablet 6  . pravastatin (PRAVACHOL) 40 MG tablet Take 40 mg by mouth at bedtime.     . senna-docusate (SENOKOT-S) 8.6-50 MG tablet Take 2 tablets by mouth daily. 60 tablet 6  . silver sulfADIAZINE (SILVADENE) 1 % cream Apply topically daily. 50 g 3  . Tapentadol HCl (NUCYNTA ER) 200 MG TB12 Take 1 tablet by mouth every 12 (twelve) hours. 30 tablet 0  . Vilazodone HCl (VIIBRYD) 20 MG TABS Take 1 tablet (20 mg total) by mouth daily. Takes with 52m for total of 628mdaily 30 tablet 3  . Vilazodone HCl (VIIBRYD) 40 MG TABS Take 1 tablet (40 mg total) by mouth daily. Takes with 2094mor total of 18m4mily 30 tablet 3   No current facility-administered medications for this visit.        .  PMarland KitchenYSICAL EXAMINATION: ECOG PERFORMANCE STATUS: 3 - Symptomatic, >50% confined to bed  Vitals:   10/15/15 0856  BP: 116/76  Pulse: (!) 111  Resp: (!) 22  Temp: 99.1 F (37.3 C)   Filed Weights   10/15/15 0856  Weight: 286 lb (129.7 kg)    GENERAL: Well-nourished well-developed; Alert, Moderate distress complaining of worsening abdominal pain. She is in a wheelchair. Obese. Accompanied by caregiver/and friend EYES: no pallor or icterus OROPHARYNX: no thrush or ulceration;  NECK: supple, no masses felt LYMPH:  no palpable  lymphadenopathy in the cervical, axillary or inguinal regions LUNGS: clear to auscultation and  No wheeze or crackles HEART/CVS: regular rate & rhythm and no murmurs; No lower extremity edema ABDOMEN: abdomen soft, non-tender and normal bowel sounds Musculoskeletal:no cyanosis of digits and no clubbing  PSYCH: drowsy-but easily arousable & oriented x 3 NEURO: no focal motor/sensory deficits SKIN:  no rashes or significant lesions  LABORATORY DATA:  I have reviewed the data as listed Lab Results  Component Value Date   WBC 11.4 (H) 10/15/2015   HGB 11.2 (L) 10/15/2015   HCT 33.9 (L) 10/15/2015   MCV 82.5 10/15/2015   PLT 207 10/15/2015    Recent Labs  09/19/15 0737  10/01/15 0908 10/08/15 0933 10/15/15 0827  NA 135  < > 130* 139 135  K 3.1*  < > 3.5 2.8* 2.5*  CL 94*  < > 93* 100* 95*  CO2 31  < > 28 26 27   GLUCOSE 190*  < > 231* 193* 197*  BUN 12  < > 8 11 10   CREATININE 0.93  < > 0.91 1.21* 0.85  CALCIUM 8.6*  < > 7.8* 8.9 8.3*  GFRNONAA >60  < > >60 49* >60  GFRAA >60  < > >60 57* >60  PROT 8.1  --  7.1  --  8.1  ALBUMIN 3.2*  --  2.9*  --  3.3*  AST 29  --  34  --  34  ALT 22  --  22  --  24  ALKPHOS 91  --  84  --  102  BILITOT 0.7  --  0.3  --  0.9  < > = values in this interval not displayed.  RADIOGRAPHIC STUDIES: I have personally reviewed the radiological images as listed and agreed with the findings in the report. Ct  Abdomen Pelvis Wo Contrast  Result Date: 09/19/2015 CLINICAL DATA:  Lower abdominal pain EXAM: CT ABDOMEN AND PELVIS WITHOUT CONTRAST TECHNIQUE: Multidetector CT imaging of the abdomen and pelvis was performed following the standard protocol without IV contrast. COMPARISON:  09/05/2005 FINDINGS: Innumerable bilateral pulmonary nodules are stable Innumerable lesions throughout the liver are stable. Spleen, pancreas, adrenal glands, gallbladder are within normal limits Bilateral hydronephrosis is stable. Markedly enlarged uterus consistent with the patient's known malignancy has grown in size. On sagittal reconstruction imaging, the uterus measures 9.7 x 16.9 cm. Previously, based on my measurements, it measured 9.0 x 14 cm. It extends into the lower abdomen. No evidence of small-bowel obstruction. Appendix is within normal limits. The rectum is somewhat difficult to visualize. There is suspected wall thickening of the upper rectum. See image 83 of series 7. This may represent spread of malignancy or possibly an inflammatory process. Foley catheter decompresses the bladder. There is no free fluid. No vertebral compression deformity. IMPRESSION: Over the course of 2 weeks, there has been in increasing size of the uterus worrisome for worsening malignancy. Metastatic disease in the liver and lung bases is stable. There is wall thickening of the rectum representing either spread of tumor or possibly an inflammatory process. Foley catheter decompresses the bladder. Hydronephrosis persists and is likely related to compression of the ureters from the enlarged uterus. Electronically Signed   By: Marybelle Killings M.D.   On: 09/19/2015 10:09   Ct Renal Stone Study  Result Date: 09/26/2015 CLINICAL DATA:  Acute onset of urinary retention. Patient on chemotherapy for uterine cancer. Initial encounter. EXAM: CT ABDOMEN AND PELVIS WITHOUT CONTRAST TECHNIQUE: Multidetector CT imaging of  the abdomen and pelvis was performed  following the standard protocol without IV contrast. COMPARISON:  CT of the abdomen and pelvis performed 09/19/2015 FINDINGS: Numerous nodules of varying size are noted throughout the lung bases, compatible with metastatic disease. Trace pericardial fluid remains within normal limits. Numerous prominent masses are seen throughout the liver, compatible with metastatic disease. The spleen is unremarkable in appearance. There is suggestion of a 5 mm stone at the distal common bile duct, without evidence of dilatation of the common bile duct. The pancreas is grossly unremarkable in appearance. The gallbladder is within normal limits. The adrenal glands are unremarkable. Moderate bilateral hydronephrosis is again noted, with diffuse dilatation of both ureters. This appears to reflect obstruction due to the large pelvic mass. No perinephric stranding is seen. No nonobstructing renal stones are identified. No free fluid is identified. The small bowel is unremarkable in appearance. The stomach is within normal limits. No acute vascular abnormalities are seen. The appendix is normal in caliber, without evidence of appendicitis. The proximal colon is grossly unremarkable in appearance, though the sigmoid colon is not well assessed due to surrounding mass. There appears to be diffuse infiltration of the rectum from the large pelvic mass. The bladder is is not well characterized due to the overlying mass. The uterus is diffusely and irregularly enlarged, measuring 16.7 x 15.6 x 10.0 cm, reflecting an underlying uterine mass. Associated soft tissue inflammation and trace free fluid is seen. No inguinal lymphadenopathy is seen. No acute osseous abnormalities are identified. Bilateral hip arthroplasties are grossly unremarkable in appearance, though incompletely imaged. IMPRESSION: 1. Diffuse irregular enlargement of the uterus, measuring 16.7 x 15.6 x 10.0 cm, similar in appearance to the recent prior study and reflecting  underlying uterine mass. Associated soft tissue inflammation and trace free fluid noted, mildly more prominent than on the prior study. 2. Apparent diffuse infiltration of the rectum from the large pelvic mass; the sigmoid colon is not well assessed due to surrounding mass. 3. Moderate bilateral hydronephrosis, with diffuse dilatation of both ureters. This appears to reflect obstruction due to the large pelvic mass. 4. Numerous prominent masses throughout the liver, compatible with metastatic disease. 5. Numerous nodules of varying size throughout the lung bases, reflecting metastatic disease. 6. Suggestion of a 5 mm stone at the distal common bile duct, without evidence of dilatation of the common bile duct. Electronically Signed   By: Garald Balding M.D.   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 Stage IV- on palliative treatments with Torisel  s/p 4 treatments; Most recent scan August 2017 shows-stable liver lesions/increasing uterine mass.    # Discussed continuing treatments- treatment #7 today. Patient declined [complains of nausea; length of stay etc.].   # Chronic pain-worsening; likely from progressive malignancy. recommend continue Nucynta 200 twice a day ; add dilaudid 2 mg every 3 hr. prescription for 2 weeks given.   # Severe hypokalemia- potassium 2.5; status post supplementation 10 IV; and also 40 twice a day at home  # Long discussion the patient and family regarding the overall poor prognosis; and I do not think patient is a good candidate for chemotherapy like gemcitabine and Taxotere given her poor performance status. She is at high risk for multiple side effects. Discussed at length.  # Patient's caregiver requests hospice evaluation given the decline in the patient's overall clinical situation. I  think this is reasonable. We will initiate a  hospice referral.  # Follow-up treatment in one week labs follow-up with me in 2 weeks labs.   # 40 minutes face-to-face with the patient discussing the above plan of care; more than 50% of time spent on prognosis/ natural history; counseling and coordination.    Cammie Sickle, MD 10/15/2015 5:18 PM

## 2015-10-15 NOTE — Progress Notes (Signed)
Pt presents to wheelchair with her 'sitter'- Artis and friend Ms. McDonald.  Artis is key historian for medications.  Pt is out of the following medications:    Viibryd 40 mg and 20 mg, nucynta er, silvadene cream, senokot-S, pravastatin, potassium, oxycodone 10 mg, lactulose, dilaudid, hctz, and wellbutrin and gabapentin.  V/o MD- not necessary to pravastatin and oxycodone. Ok to renew all other meds.    Condition-guarded today.  HIGH FALL RISK! Pt has fallen multiple times last week at residence. She has home health nurse - Aritis during the day but Sabino Gasser is requesting personal home health aide at bedtime as he personally works during the night and is unable to care for patient at night.   Sitter in Upon arrival to clinic today, Pt is very lethargic and restless today "I just want to lay down and sleep today.  She is unable to ambulate without assist. Unstable gait when walking.  RN encourage pt to stay in w/c.  Pt attempting to get out of w/c and walk down hallway without assist. Pt stood up in exam room from w/c - gait unstable. Reoriented pt back to w/c.   However, pt is arrousable to sound of voice. She is able to wake up and respond to voice and touch.  Pt denies any pain. She denies any nausea, vomiting or diarrhea. C/o intermittent constipation. She has a foley cath in place. Urology f/u to evaluate Internal foley yesterday- pt was evaluated by Dr. Erlene Quan.  Artis reports that pt has frequent episodes of bladder spasms. Pt using ditropan for bladder spasms.   Pt states,  "I'm not feeling good. I just want to go home. I think if I go home, next time, I might have a big improvement. I don't think my chronic pain is my issue more so than my cancer pain. It's been like that for along time. I have back pain, it's no so bad. My pain is now in my stomach. I'm ok right now. I feel like I've been dishonest with my doctor-I don't want anyone to know how bad my pain has really been."  My stomach pain is  not getting worse."  Pt very tearful in exam room.

## 2015-10-15 NOTE — Telephone Encounter (Signed)
Pharmacist questioning change in Wellbutrin SR.  Pt has always been on Wellbutrin xl. Spoke with md. Will keep pt on same med Wellbutrin xl instead of SR. Pharmacist made aware.

## 2015-10-15 NOTE — Progress Notes (Signed)
1040am-RN was contacted by Jocelyn Lamer, RN. Pt went to bathroom and laid on floor of bathroom "to sleep." pt told RN that she "justs want to go home."  Per v/o Dr. Rogue Bussing- pt has the option to sign AMA papers. Not advisable to d/c pt with potassium of 2.5. RN Explained risk factors of critical potassium/ hypokalemia to patient and caregiver-Ms. McDonald. RN explained that pt may be d/c by signing [place] papers or go to the ED for further evaluation.  Pt agreed to potassium infusion and stated, "I will get the infusion and as long as I am able to lie down."

## 2015-10-16 ENCOUNTER — Encounter: Payer: Self-pay | Admitting: *Deleted

## 2015-10-16 ENCOUNTER — Other Ambulatory Visit: Payer: Self-pay

## 2015-10-16 ENCOUNTER — Inpatient Hospital Stay
Admission: EM | Admit: 2015-10-16 | Discharge: 2015-10-19 | DRG: 641 | Disposition: A | Discharge status: Skilled Nursing Facility | Payer: Medicare Other | Attending: Specialist | Admitting: Specialist

## 2015-10-16 DIAGNOSIS — M5136 Other intervertebral disc degeneration, lumbar region: Secondary | ICD-10-CM | POA: Diagnosis not present

## 2015-10-16 DIAGNOSIS — Z87891 Personal history of nicotine dependence: Secondary | ICD-10-CM

## 2015-10-16 DIAGNOSIS — K5900 Constipation, unspecified: Secondary | ICD-10-CM | POA: Diagnosis not present

## 2015-10-16 DIAGNOSIS — T402X5A Adverse effect of other opioids, initial encounter: Secondary | ICD-10-CM | POA: Diagnosis present

## 2015-10-16 DIAGNOSIS — Y92009 Unspecified place in unspecified non-institutional (private) residence as the place of occurrence of the external cause: Secondary | ICD-10-CM | POA: Diagnosis not present

## 2015-10-16 DIAGNOSIS — N1330 Unspecified hydronephrosis: Secondary | ICD-10-CM | POA: Diagnosis not present

## 2015-10-16 DIAGNOSIS — T83021A Displacement of indwelling urethral catheter, initial encounter: Secondary | ICD-10-CM | POA: Diagnosis present

## 2015-10-16 DIAGNOSIS — J45909 Unspecified asthma, uncomplicated: Secondary | ICD-10-CM | POA: Diagnosis present

## 2015-10-16 DIAGNOSIS — N139 Obstructive and reflux uropathy, unspecified: Secondary | ICD-10-CM

## 2015-10-16 DIAGNOSIS — C548 Malignant neoplasm of overlapping sites of corpus uteri: Secondary | ICD-10-CM | POA: Diagnosis present

## 2015-10-16 DIAGNOSIS — Z79899 Other long term (current) drug therapy: Secondary | ICD-10-CM | POA: Diagnosis not present

## 2015-10-16 DIAGNOSIS — F329 Major depressive disorder, single episode, unspecified: Secondary | ICD-10-CM | POA: Diagnosis present

## 2015-10-16 DIAGNOSIS — R338 Other retention of urine: Secondary | ICD-10-CM | POA: Diagnosis present

## 2015-10-16 DIAGNOSIS — R109 Unspecified abdominal pain: Secondary | ICD-10-CM | POA: Diagnosis not present

## 2015-10-16 DIAGNOSIS — Z9181 History of falling: Secondary | ICD-10-CM | POA: Diagnosis not present

## 2015-10-16 DIAGNOSIS — E1142 Type 2 diabetes mellitus with diabetic polyneuropathy: Secondary | ICD-10-CM | POA: Diagnosis present

## 2015-10-16 DIAGNOSIS — T502X5A Adverse effect of carbonic-anhydrase inhibitors, benzothiadiazides and other diuretics, initial encounter: Secondary | ICD-10-CM | POA: Diagnosis present

## 2015-10-16 DIAGNOSIS — M549 Dorsalgia, unspecified: Secondary | ICD-10-CM | POA: Diagnosis present

## 2015-10-16 DIAGNOSIS — G8929 Other chronic pain: Secondary | ICD-10-CM | POA: Diagnosis present

## 2015-10-16 DIAGNOSIS — Z6841 Body Mass Index (BMI) 40.0 and over, adult: Secondary | ICD-10-CM | POA: Diagnosis not present

## 2015-10-16 DIAGNOSIS — E860 Dehydration: Secondary | ICD-10-CM | POA: Diagnosis present

## 2015-10-16 DIAGNOSIS — N2889 Other specified disorders of kidney and ureter: Secondary | ICD-10-CM | POA: Diagnosis not present

## 2015-10-16 DIAGNOSIS — C7800 Secondary malignant neoplasm of unspecified lung: Secondary | ICD-10-CM | POA: Diagnosis not present

## 2015-10-16 DIAGNOSIS — Z8249 Family history of ischemic heart disease and other diseases of the circulatory system: Secondary | ICD-10-CM

## 2015-10-16 DIAGNOSIS — Z823 Family history of stroke: Secondary | ICD-10-CM

## 2015-10-16 DIAGNOSIS — G629 Polyneuropathy, unspecified: Secondary | ICD-10-CM | POA: Diagnosis not present

## 2015-10-16 DIAGNOSIS — Z5111 Encounter for antineoplastic chemotherapy: Secondary | ICD-10-CM | POA: Diagnosis not present

## 2015-10-16 DIAGNOSIS — E785 Hyperlipidemia, unspecified: Secondary | ICD-10-CM | POA: Diagnosis present

## 2015-10-16 DIAGNOSIS — C55 Malignant neoplasm of uterus, part unspecified: Secondary | ICD-10-CM | POA: Diagnosis present

## 2015-10-16 DIAGNOSIS — I4581 Long QT syndrome: Secondary | ICD-10-CM | POA: Diagnosis present

## 2015-10-16 DIAGNOSIS — G4700 Insomnia, unspecified: Secondary | ICD-10-CM | POA: Diagnosis not present

## 2015-10-16 DIAGNOSIS — Z7982 Long term (current) use of aspirin: Secondary | ICD-10-CM

## 2015-10-16 DIAGNOSIS — Y846 Urinary catheterization as the cause of abnormal reaction of the patient, or of later complication, without mention of misadventure at the time of the procedure: Secondary | ICD-10-CM | POA: Diagnosis present

## 2015-10-16 DIAGNOSIS — E876 Hypokalemia: Secondary | ICD-10-CM | POA: Diagnosis present

## 2015-10-16 DIAGNOSIS — Z8551 Personal history of malignant neoplasm of bladder: Secondary | ICD-10-CM | POA: Diagnosis not present

## 2015-10-16 DIAGNOSIS — R296 Repeated falls: Secondary | ICD-10-CM | POA: Diagnosis present

## 2015-10-16 DIAGNOSIS — R531 Weakness: Secondary | ICD-10-CM

## 2015-10-16 DIAGNOSIS — N390 Urinary tract infection, site not specified: Secondary | ICD-10-CM | POA: Diagnosis present

## 2015-10-16 DIAGNOSIS — I10 Essential (primary) hypertension: Secondary | ICD-10-CM | POA: Diagnosis present

## 2015-10-16 DIAGNOSIS — C787 Secondary malignant neoplasm of liver and intrahepatic bile duct: Secondary | ICD-10-CM | POA: Diagnosis not present

## 2015-10-16 DIAGNOSIS — Z7984 Long term (current) use of oral hypoglycemic drugs: Secondary | ICD-10-CM

## 2015-10-16 DIAGNOSIS — E119 Type 2 diabetes mellitus without complications: Secondary | ICD-10-CM | POA: Diagnosis present

## 2015-10-16 DIAGNOSIS — M21371 Foot drop, right foot: Secondary | ICD-10-CM | POA: Diagnosis not present

## 2015-10-16 DIAGNOSIS — K5903 Drug induced constipation: Secondary | ICD-10-CM | POA: Diagnosis present

## 2015-10-16 DIAGNOSIS — R339 Retention of urine, unspecified: Secondary | ICD-10-CM | POA: Diagnosis not present

## 2015-10-16 DIAGNOSIS — Z825 Family history of asthma and other chronic lower respiratory diseases: Secondary | ICD-10-CM

## 2015-10-16 DIAGNOSIS — N3289 Other specified disorders of bladder: Secondary | ICD-10-CM | POA: Diagnosis not present

## 2015-10-16 DIAGNOSIS — E669 Obesity, unspecified: Secondary | ICD-10-CM | POA: Diagnosis not present

## 2015-10-16 DIAGNOSIS — R9431 Abnormal electrocardiogram [ECG] [EKG]: Secondary | ICD-10-CM

## 2015-10-16 LAB — BASIC METABOLIC PANEL
ANION GAP: 11 (ref 5–15)
BUN: 10 mg/dL (ref 6–20)
CALCIUM: 8.2 mg/dL — AB (ref 8.9–10.3)
CO2: 29 mmol/L (ref 22–32)
Chloride: 95 mmol/L — ABNORMAL LOW (ref 101–111)
Creatinine, Ser: 0.87 mg/dL (ref 0.44–1.00)
Glucose, Bld: 230 mg/dL — ABNORMAL HIGH (ref 65–99)
POTASSIUM: 2.2 mmol/L — AB (ref 3.5–5.1)
SODIUM: 135 mmol/L (ref 135–145)

## 2015-10-16 LAB — CBC
HEMATOCRIT: 33.4 % — AB (ref 35.0–47.0)
HEMOGLOBIN: 10.8 g/dL — AB (ref 12.0–16.0)
MCH: 27.5 pg (ref 26.0–34.0)
MCHC: 32.5 g/dL (ref 32.0–36.0)
MCV: 84.8 fL (ref 80.0–100.0)
Platelets: 189 10*3/uL (ref 150–440)
RBC: 3.94 MIL/uL (ref 3.80–5.20)
RDW: 14.7 % — ABNORMAL HIGH (ref 11.5–14.5)
WBC: 13.1 10*3/uL — ABNORMAL HIGH (ref 3.6–11.0)

## 2015-10-16 LAB — MAGNESIUM: MAGNESIUM: 1.6 mg/dL — AB (ref 1.7–2.4)

## 2015-10-16 LAB — URINALYSIS COMPLETE WITH MICROSCOPIC (ARMC ONLY)
Bilirubin Urine: NEGATIVE
Glucose, UA: 50 mg/dL — AB
Hgb urine dipstick: NEGATIVE
KETONES UR: NEGATIVE mg/dL
Nitrite: NEGATIVE
PROTEIN: 30 mg/dL — AB
Specific Gravity, Urine: 1.013 (ref 1.005–1.030)
pH: 6 (ref 5.0–8.0)

## 2015-10-16 MED ORDER — VILAZODONE HCL 40 MG PO TABS: 40.0000 mg | ORAL_TABLET | Freq: Every day | ORAL | Status: DC

## 2015-10-16 MED ORDER — HYDROMORPHONE HCL 2 MG PO TABS
2.0000 mg | ORAL_TABLET | ORAL | Status: DC | PRN
  Administered 2015-10-16 – 2015-10-19 (×12): 2 mg via ORAL
  Filled 2015-10-16 (×12): qty 1

## 2015-10-16 MED ORDER — SODIUM CHLORIDE 0.9 % IV SOLN: 250.0000 mL | INTRAVENOUS | Status: DC | PRN

## 2015-10-16 MED ORDER — TRAZODONE HCL 100 MG PO TABS
300.0000 mg | ORAL_TABLET | Freq: Every evening | ORAL | Status: DC | PRN
  Administered 2015-10-16: 300 mg via ORAL
  Filled 2015-10-16: qty 3

## 2015-10-16 MED ORDER — TAPENTADOL HCL ER 50 MG PO TB12
200.0000 mg | ORAL_TABLET | Freq: Two times a day (BID) | ORAL | Status: DC
  Administered 2015-10-16 – 2015-10-19 (×6): 200 mg via ORAL
  Filled 2015-10-16 (×6): qty 4

## 2015-10-16 MED ORDER — LACTULOSE 10 GM/15ML PO SOLN
30.0000 g | Freq: Two times a day (BID) | ORAL | Status: DC | PRN
  Administered 2015-10-17: 30 g via ORAL
  Filled 2015-10-16: qty 60

## 2015-10-16 MED ORDER — ASPIRIN EC 81 MG PO TBEC
81.0000 mg | DELAYED_RELEASE_TABLET | ORAL | Status: DC
  Administered 2015-10-17 – 2015-10-19 (×3): 81 mg via ORAL
  Filled 2015-10-16 (×3): qty 1

## 2015-10-16 MED ORDER — ACETAMINOPHEN 650 MG RE SUPP: 650.0000 mg | Freq: Four times a day (QID) | RECTAL | Status: DC | PRN

## 2015-10-16 MED ORDER — MAGNESIUM SULFATE 2 GM/50ML IV SOLN
2.0000 g | Freq: Once | INTRAVENOUS | Status: AC
  Administered 2015-10-16: 2 g via INTRAVENOUS
  Filled 2015-10-16: qty 50

## 2015-10-16 MED ORDER — VILAZODONE HCL 20 MG PO TABS
60.0000 mg | ORAL_TABLET | Freq: Every day | ORAL | Status: DC
  Administered 2015-10-16 – 2015-10-18 (×3): 60 mg via ORAL
  Filled 2015-10-16 (×4): qty 3

## 2015-10-16 MED ORDER — SODIUM CHLORIDE 0.9% FLUSH: 3.0000 mL | INTRAVENOUS | Status: DC | PRN

## 2015-10-16 MED ORDER — POLYETHYLENE GLYCOL 3350 17 G PO PACK
17.0000 g | PACK | Freq: Two times a day (BID) | ORAL | Status: DC
  Administered 2015-10-16 – 2015-10-19 (×6): 17 g via ORAL
  Filled 2015-10-16 (×6): qty 1

## 2015-10-16 MED ORDER — BUPROPION HCL ER (SR) 150 MG PO TB12
150.0000 mg | ORAL_TABLET | Freq: Every day | ORAL | Status: DC
  Administered 2015-10-16 – 2015-10-19 (×4): 150 mg via ORAL
  Filled 2015-10-16 (×4): qty 1

## 2015-10-16 MED ORDER — ENOXAPARIN SODIUM 40 MG/0.4ML ~~LOC~~ SOLN
40.0000 mg | SUBCUTANEOUS | Status: DC
  Administered 2015-10-16: 40 mg via SUBCUTANEOUS
  Filled 2015-10-16: qty 0.4

## 2015-10-16 MED ORDER — OXYBUTYNIN CHLORIDE 5 MG PO TABS
5.0000 mg | ORAL_TABLET | Freq: Three times a day (TID) | ORAL | Status: DC | PRN
  Administered 2015-10-16 – 2015-10-19 (×2): 5 mg via ORAL
  Filled 2015-10-16 (×2): qty 1

## 2015-10-16 MED ORDER — PRAVASTATIN SODIUM 40 MG PO TABS
40.0000 mg | ORAL_TABLET | Freq: Every day | ORAL | Status: DC
  Administered 2015-10-16 – 2015-10-18 (×3): 40 mg via ORAL
  Filled 2015-10-16 (×3): qty 1

## 2015-10-16 MED ORDER — LISINOPRIL 10 MG PO TABS
10.0000 mg | ORAL_TABLET | ORAL | Status: DC
Start: 2015-10-17 — End: 2015-10-19
  Administered 2015-10-17 – 2015-10-19 (×3): 10 mg via ORAL
  Filled 2015-10-16 (×3): qty 1

## 2015-10-16 MED ORDER — POTASSIUM CHLORIDE 10 MEQ/100ML IV SOLN
10.0000 meq | INTRAVENOUS | Status: AC
  Administered 2015-10-16: 10 meq via INTRAVENOUS
  Filled 2015-10-16 (×2): qty 100

## 2015-10-16 MED ORDER — SODIUM CHLORIDE 0.9% FLUSH
3.0000 mL | Freq: Two times a day (BID) | INTRAVENOUS | Status: DC
  Administered 2015-10-17 – 2015-10-19 (×5): 3 mL via INTRAVENOUS

## 2015-10-16 MED ORDER — POTASSIUM CHLORIDE 10 MEQ/100ML IV SOLN
10.0000 meq | Freq: Once | INTRAVENOUS | Status: AC
Start: 2015-10-16 — End: 2015-10-16
  Administered 2015-10-16: 10 meq via INTRAVENOUS

## 2015-10-16 MED ORDER — GABAPENTIN 600 MG PO TABS
600.0000 mg | ORAL_TABLET | Freq: Two times a day (BID) | ORAL | Status: DC
  Administered 2015-10-16 – 2015-10-19 (×6): 600 mg via ORAL
  Filled 2015-10-16 (×6): qty 1

## 2015-10-16 MED ORDER — ACETAMINOPHEN 325 MG PO TABS: 650.0000 mg | ORAL_TABLET | Freq: Four times a day (QID) | ORAL | Status: DC | PRN

## 2015-10-16 MED ORDER — POTASSIUM CHLORIDE CRYS ER 20 MEQ PO TBCR
40.0000 meq | EXTENDED_RELEASE_TABLET | ORAL | Status: AC
  Administered 2015-10-16 – 2015-10-17 (×3): 40 meq via ORAL
  Filled 2015-10-16 (×3): qty 2

## 2015-10-16 MED ORDER — DEXTROSE 5 % IV SOLN
1.0000 g | Freq: Every day | INTRAVENOUS | Status: DC
  Administered 2015-10-17 – 2015-10-18 (×2): 1 g via INTRAVENOUS
  Filled 2015-10-16 (×3): qty 10

## 2015-10-16 NOTE — H&P (Signed)
[facility] NAME: Annette [name]    MR#:  [id number]  DATE OF BIRTH:  05-03-1960  DATE OF ADMISSION:  10/16/2015  PRIMARY CARE PHYSICIAN: Harriett Neita Carp, MD   REQUESTING/REFERRING PHYSICIAN: Delman Kitten, MD  CHIEF COMPLAINT:   Chief Complaint  Patient presents with  . Urinary Retention   Urinary Retention and generalized weakness. HISTORY OF PRESENT ILLNESS:  Annette [name]  is a 55 y.o. female with a known history of Bladder cancer, hypertension, hyperlipidemia, diabetes and back pain. The patient was sent to the ED due to Foley catheter dislodged. she's had the Foley catheter in because of urinary blockage associated with cancer. The Foley catheter is replaced in the ED. But the patient also complains of nausea and severe generalized weakness. Patient was found to have potassium 2.2 and magnesium at 1.6. EKG showed prolonged QT. ED physician ordered otassium IV and magnesium IV. Patient's caregiver reported that the patient had multiple falls due to weakness for the past few days.  PAST MEDICAL HISTORY:   Past Medical History:  Diagnosis Date  . Asthma   . Back pain   . Bladder absent    unable to urination- in hospital  . Cancer [facility])   . DDD (degenerative disc disease), lumbar   . Depression   . Diabetes mellitus without complication ([place])   . Hyperlipidemia   . Hypertension   . Insomnia   . Menopausal syndrome   . Obesity   . Onychomycosis   . Peripheral neuropathy ([place])   . Renal insufficiency     PAST SURGICAL HISTORY:   Past Surgical History:  Procedure Laterality Date  . COSMETIC SURGERY    . JOINT REPLACEMENT Bilateral [id number]  . LIVER BIOPSY    . REDUCTION MAMMAPLASTY Bilateral 12/15/2013  . SHOULDER SURGERY Left     SOCIAL HISTORY:   Social History  Substance Use Topics  . Smoking status: Former Smoker    Quit date: 08/19/2015  . Smokeless tobacco: Never Used  . Alcohol use No    FAMILY  HISTORY:   Family History  Problem Relation Age of Onset  . COPD Mother   . Sleep apnea Mother   . Alcohol abuse Mother   . Depression Mother   . Stroke Mother   . Heart disease Father   . Heart disease Sister   . Bladder Cancer Neg Hx   . Kidney cancer Neg Hx     DRUG ALLERGIES:   Allergies  Allergen Reactions  . Iodides Swelling  . Iodine Swelling  . Shellfish Allergy Swelling    shrimp causes swelling  . Shellfish-Derived Products Swelling  . Iodinated Diagnostic Agents Swelling  . Povidone Iodine Swelling    eyes  . Povidone-Iodine Swelling    REVIEW OF SYSTEMS:   Review of Systems  Constitutional: Positive for malaise/fatigue. Negative for chills and fever.  HENT: Negative for congestion.   Eyes: Negative for blurred vision and double vision.  Respiratory: Negative for cough, sputum production, wheezing and stridor.   Cardiovascular: Negative for chest pain, orthopnea and leg swelling.  Gastrointestinal: Positive for nausea. Negative for abdominal pain, blood in stool, diarrhea, melena and vomiting.  Musculoskeletal: Positive for falls. Negative for joint pain.  Skin: Negative for itching and rash.  Neurological: Positive for weakness. Negative for dizziness, tingling, seizures, loss of consciousness and headaches.  Psychiatric/Behavioral: Negative for depression.    MEDICATIONS AT HOME:   Prior to Admission medications  Medication Sig Start Date End Date Taking? Authorizing Provider  alprazolam Duanne Moron) 2 MG tablet Take 2 mg by mouth 2 (two) times daily as needed for anxiety.     Historical Provider, MD  aspirin EC 81 MG tablet Take 81 mg by mouth every morning.     Historical Provider, MD  buPROPion (WELLBUTRIN SR) 150 MG 12 hr tablet Take 1 tablet (150 mg total) by mouth daily. 10/15/15   Cammie Sickle, MD  gabapentin (NEURONTIN) 600 MG tablet Limit 1 tablet by mouth twice per day if tolerated 10/15/15   Cammie Sickle, MD  hydrochlorothiazide  (HYDRODIURIL) 25 MG tablet Take 1 tablet (25 mg total) by mouth every morning. 10/15/15   Cammie Sickle, MD  HYDROmorphone (DILAUDID) 2 MG tablet Take 1 tablet (2 mg total) by mouth every 3 (three) hours as needed for severe pain. 10/15/15   Cammie Sickle, MD  lactulose (CHRONULAC) 10 GM/15ML solution Take 30 g by mouth 2 (two) times daily as needed for mild constipation or moderate constipation.     Historical Provider, MD  lisinopril (PRINIVIL,ZESTRIL) 10 MG tablet Take 10 mg by mouth every morning.     Historical Provider, MD  metFORMIN (GLUCOPHAGE-XR) 500 MG 24 hr tablet Take 500 mg by mouth daily with breakfast.    Historical Provider, MD  oxybutynin (DITROPAN) 5 MG tablet Take 1 tablet (5 mg total) by mouth every 8 (eight) hours as needed for bladder spasms. 10/14/15   Hollice Espy, MD  Oxycodone HCl 10 MG TABS Take 1 -2 tablets every 3 - 4 hours as needed for pain Patient not taking: Reported on 10/15/2015 09/28/15   Lloyd Huger, MD  polyethylene glycol (MIRALAX / Floria Raveling) packet Take 17 g by mouth 2 (two) times daily. 09/21/15   Fritzi Mandes, MD  Potassium Chloride ER 20 MEQ TBCR Take 1 tablet by mouth 2 (two) times daily. 10/15/15   Cammie Sickle, MD  pravastatin (PRAVACHOL) 40 MG tablet Take 40 mg by mouth at bedtime.     Historical Provider, MD  senna-docusate (SENOKOT-S) 8.6-50 MG tablet Take 2 tablets by mouth daily. 10/15/15   Cammie Sickle, MD  silver sulfADIAZINE (SILVADENE) 1 % cream Apply topically daily. 10/15/15   Cammie Sickle, MD  Tapentadol HCl (NUCYNTA ER) 200 MG TB12 Take 1 tablet by mouth every 12 (twelve) hours. 10/15/15   Cammie Sickle, MD  tiZANidine (ZANAFLEX) 2 MG tablet Limit 1 tablet by mouth 2-4 times per day if tolerated 09/02/15   Mohammed Kindle, MD  traZODone (DESYREL) 150 MG tablet Take 300 mg by mouth at bedtime as needed for sleep.     Historical Provider, MD  Vilazodone HCl (VIIBRYD) 20 MG TABS Take 1 tablet (20 mg total) by mouth  daily. Takes with 40mg  for total of 60mg  daily 10/15/15   Cammie Sickle, MD  Vilazodone HCl (VIIBRYD) 40 MG TABS Take 1 tablet (40 mg total) by mouth daily. Takes with 20mg  for total of 60mg  daily 10/15/15   Cammie Sickle, MD      VITAL SIGNS:  Blood pressure 123/76, pulse (!) 103, temperature 99.4 F (37.4 C), temperature source Oral, resp. rate (!) 21, height 5\' 5"  (1.651 m), weight 237 lb (107.5 kg), SpO2 94 %.  PHYSICAL EXAMINATION:  Physical Exam  Constitutional: She is oriented to person, place, and time and well-developed, well-nourished, and in no distress. No distress.  HENT:  Head: Normocephalic and atraumatic.  Dry oral mucosa  Eyes: Conjunctivae and EOM are normal. Pupils are equal, round, and reactive to light.  Neck: Normal range of motion. Neck supple. No JVD present. No thyromegaly present.  Cardiovascular: Normal rate and regular rhythm.  Exam reveals no gallop.   No murmur heard. Pulmonary/Chest: Effort normal and breath sounds normal. No respiratory distress. She has no wheezes. She has no rales.  Abdominal: Soft. Bowel sounds are normal. She exhibits no distension. There is no tenderness.  Musculoskeletal: She exhibits no edema or tenderness.  Lymphadenopathy:    She has no cervical adenopathy.  Neurological: She is alert and oriented to person, place, and time.  Palmer 3-4 out of 5 in all extremities.  Skin: Skin is warm.  Psychiatric: Affect normal.    GENERAL:  55 y.o.-year-old patient lying in the bed with no acute distress.  EYES: Pupils equal, round, reactive to light and accommodation. No scleral icterus. Extraocular muscles intact.  HEENT: Head atraumatic, normocephalic. Oropharynx and nasopharynx clear.  NECK:  Supple, no jugular venous distention. No thyroid enlargement, no tenderness.  LUNGS: Normal breath sounds bilaterally, no wheezing, rales,rhonchi or crepitation. No use of accessory muscles of respiration.  CARDIOVASCULAR: S1, S2  normal. No murmurs, rubs, or gallops.  ABDOMEN: Soft, nontender, nondistended. Bowel sounds present. No organomegaly or mass.  EXTREMITIES: No pedal edema, cyanosis, or clubbing.  NEUROLOGIC: Cranial nerves II through XII are intact. Muscle strength 5/5 in all extremities. Sensation intact. Gait not checked.  PSYCHIATRIC: The patient is alert and oriented x 3.  SKIN: No obvious rash, lesion, or ulcer.   LABORATORY PANEL:   CBC  Recent Labs Lab 10/15/15 0827  WBC 11.4*  HGB 11.2*  HCT 33.9*  PLT 207   ------------------------------------------------------------------------------------------------------------------  Chemistries   Recent Labs Lab 10/15/15 0827 10/16/15 1717  NA 135 135  K 2.5* 2.2*  CL 95* 95*  CO2 27 29  GLUCOSE 197* 230*  BUN 10 10  CREATININE 0.85 0.87  CALCIUM 8.3* 8.2*  MG  --  1.6*  AST 34  --   ALT 24  --   ALKPHOS 102  --   BILITOT 0.9  --    ------------------------------------------------------------------------------------------------------------------  Cardiac Enzymes No results for input(s): TROPONINI in the last 168 hours. ------------------------------------------------------------------------------------------------------------------  RADIOLOGY:  No results found.    IMPRESSION AND PLAN:   Severe hypokalemia with prolonged QT The patient will be admitted to telemetry floor. Continued telemetry monitor, give potassium supplement IV and by mouth, follow-up level in the morning. Hold HCTZ.  Hypomagnesemia. Give IV magnesium and a follow-up level.  Possible UTI with leukocytosis. Start rocephin and follow-up urine culture. Follow-up CBC.  History of bladder cancer with Foley catheter dislodge. Pain control and oncology consult. Foley catheter was replaced in the ED today.  Generalized weakness and falls Fall precaution, PT evaluation. Case manager consult for possible home health and PT.    All the records are reviewed  and case discussed with ED provider. Management plans discussed with the patient, Her caregiver and they are in agreement.  CODE STATUS: Full code  TOTAL TIME TAKING CARE OF THIS PATIENT: 53 minutes.    Demetrios Loll M.D on 10/16/2015 at 6:29 PM  Between 7am to 6pm - Pager - [phone]  After 6pm go to [email or web address] - Proofreader  [facility]  Office  [phone]  CC: Primary care physician; Ellamae Sia, MD   Note: This dictation was prepared with Dragon dictation along with smaller phrase technology. Any transcriptional errors  that result from this process are unintentional.

## 2015-10-16 NOTE — ED Notes (Signed)
Report from felicia, rn.  

## 2015-10-16 NOTE — ED Notes (Signed)
Pt requesting 2mg  dilaudid, pt states "he told me I could have it and my pain is off the chart."

## 2015-10-16 NOTE — ED Triage Notes (Signed)
Pt has a suprapubic catheter and states it came out early this morning.  Pt reports urinary retention.   Caregiver with pt.

## 2015-10-16 NOTE — ED Notes (Signed)
Pt complains of burning to iv site with potassium, potassium decreased to 89ml/hr for pt comfort.

## 2015-10-16 NOTE — ED Provider Notes (Signed)
[facility] Emergency Department Provider Note   ____________________________________________   First MD Initiated Contact with Patient 10/16/15 1622     (approximate)  I have reviewed the triage vital signs and the nursing notes.   HISTORY  Chief Complaint Urinary Retention    HPI Annette [name] is a 55 y.o. female here with her caretaker because her Foley catheter has come out today.  The caretaker reports he checked on her, and the patient had actually lost her Foley catheter. He reports that they saw oncology yesterday, she's had the Foley catheter in because of urinary blockage associated with cancer.  The patient denies any new concerns. Denies being in pain at present, she does report mild nausea and states that is because she has run out of her ondansetron. The patient and her caretaker will tell me that she is under care of because of terminal cancer. She is currently under the care of [facility] oncology team.  No new fevers or chills. No chest pain or shortness of breath. No new weakness or lightheadedness, though they report that she has chronic weakness and debility secondary to her cancer.  We discussed, reason for evaluation today is to have her Foley catheter replaced. Caretaker reports that they're working with the oncology service now to try to increase home health assistance, and begin to involve hospice care.   Past Medical History:  Diagnosis Date  . Asthma   . Back pain   . Bladder absent    unable to urination- in hospital  . Cancer [facility])   . DDD (degenerative disc disease), lumbar   . Depression   . Diabetes mellitus without complication ([place])   . Hyperlipidemia   . Hypertension   . Insomnia   . Menopausal syndrome   . Obesity   . Onychomycosis   . Peripheral neuropathy ([place])   . Renal insufficiency     Patient Active Problem List   Diagnosis Date Noted  . Hypokalemia 10/08/2015  . Generalized abdominal pain    . Palliative care encounter   . Chronic pain   . Bilateral hydronephrosis 09/06/2015  . Sore on leg 08/24/2015  . Intractable pain 08/23/2015  . HTN (hypertension) 08/23/2015  . HLD (hyperlipidemia) 08/23/2015  . Cancer of uterine body ([place]) 08/18/2015  . Malignant neoplasm of overlapping sites of body of uterus ([place]) 08/18/2015  . Morbid obesity with BMI of 45.0-49.9, adult ([place]) 08/14/2015  . Uterine mass 07/28/2015  . 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 DDD (degenerative disc disease), lumbar 06/24/2014  . Facet syndrome, lumbar 06/24/2014  . Neuropathy due to secondary diabetes ([place]) 06/24/2014  . DDD (degenerative disc disease), cervical 06/24/2014  . Occipital neuralgia 06/24/2014  . LBP (low back pain) 06/24/2014  . Degeneration of intervertebral disc of cervical region 06/24/2014  . Degeneration of intervertebral disc of lumbar region 06/24/2014  . Diabetes mellitus ([place]) 06/24/2014  . Large breasts 05/30/2012    Past Surgical History:  Procedure Laterality Date  . COSMETIC SURGERY    . JOINT REPLACEMENT Bilateral [id number]  . LIVER BIOPSY    . REDUCTION MAMMAPLASTY Bilateral 12/15/2013  . SHOULDER SURGERY Left     Prior to Admission medications   Medication Sig Start Date End Date Taking? Authorizing Provider  alprazolam Duanne Moron) 2 MG tablet Take 2 mg by mouth 2 (two) times daily as needed for anxiety.     Historical Provider, MD  aspirin EC 81 MG tablet Take 81 mg by mouth  every morning.     Historical Provider, MD  buPROPion (WELLBUTRIN SR) 150 MG 12 hr tablet Take 1 tablet (150 mg total) by mouth daily. 10/15/15   Cammie Sickle, MD  gabapentin (NEURONTIN) 600 MG tablet Limit 1 tablet by mouth twice per day if tolerated 10/15/15   Cammie Sickle, MD  hydrochlorothiazide (HYDRODIURIL) 25 MG tablet Take 1 tablet (25 mg total) by mouth every morning. 10/15/15   Cammie Sickle, MD  HYDROmorphone (DILAUDID) 2 MG tablet Take 1 tablet (2 mg total) by  mouth every 3 (three) hours as needed for severe pain. 10/15/15   Cammie Sickle, MD  lactulose (CHRONULAC) 10 GM/15ML solution Take 30 g by mouth 2 (two) times daily as needed for mild constipation or moderate constipation.     Historical Provider, MD  lisinopril (PRINIVIL,ZESTRIL) 10 MG tablet Take 10 mg by mouth every morning.     Historical Provider, MD  metFORMIN (GLUCOPHAGE-XR) 500 MG 24 hr tablet Take 500 mg by mouth daily with breakfast.    Historical Provider, MD  oxybutynin (DITROPAN) 5 MG tablet Take 1 tablet (5 mg total) by mouth every 8 (eight) hours as needed for bladder spasms. 10/14/15   Hollice Espy, MD  Oxycodone HCl 10 MG TABS Take 1 -2 tablets every 3 - 4 hours as needed for pain Patient not taking: Reported on 10/15/2015 09/28/15   Lloyd Huger, MD  polyethylene glycol (MIRALAX / Floria Raveling) packet Take 17 g by mouth 2 (two) times daily. 09/21/15   Fritzi Mandes, MD  Potassium Chloride ER 20 MEQ TBCR Take 1 tablet by mouth 2 (two) times daily. 10/15/15   Cammie Sickle, MD  pravastatin (PRAVACHOL) 40 MG tablet Take 40 mg by mouth at bedtime.     Historical Provider, MD  senna-docusate (SENOKOT-S) 8.6-50 MG tablet Take 2 tablets by mouth daily. 10/15/15   Cammie Sickle, MD  silver sulfADIAZINE (SILVADENE) 1 % cream Apply topically daily. 10/15/15   Cammie Sickle, MD  Tapentadol HCl (NUCYNTA ER) 200 MG TB12 Take 1 tablet by mouth every 12 (twelve) hours. 10/15/15   Cammie Sickle, MD  tiZANidine (ZANAFLEX) 2 MG tablet Limit 1 tablet by mouth 2-4 times per day if tolerated 09/02/15   Mohammed Kindle, MD  traZODone (DESYREL) 150 MG tablet Take 300 mg by mouth at bedtime as needed for sleep.     Historical Provider, MD  Vilazodone HCl (VIIBRYD) 20 MG TABS Take 1 tablet (20 mg total) by mouth daily. Takes with 40mg  for total of 60mg  daily 10/15/15   Cammie Sickle, MD  Vilazodone HCl (VIIBRYD) 40 MG TABS Take 1 tablet (40 mg total) by mouth daily. Takes with 20mg   for total of 60mg  daily 10/15/15   Cammie Sickle, MD    Allergies Iodides; Iodine; Shellfish allergy; Shellfish-derived products; Iodinated diagnostic agents; Povidone iodine; and Povidone-iodine  Family History  Problem Relation Age of Onset  . COPD Mother   . Sleep apnea Mother   . Alcohol abuse Mother   . Depression Mother   . Stroke Mother   . Heart disease Father   . Heart disease Sister   . Bladder Cancer Neg Hx   . Kidney cancer Neg Hx     Social History Social History  Substance Use Topics  . Smoking status: Former Smoker    Quit date: 08/19/2015  . Smokeless tobacco: Never Used  . Alcohol use No    Review of Systems Constitutional: No  fever/chills. Increasing generalized weakness Eyes: No visual changes. ENT: No sore throat. Cardiovascular: Denies chest pain. Respiratory: Denies shortness of breath. Gastrointestinal: No abdominal pain except for chronic discomfort associated with her cancer, presently reports her pain is well-controlled but does have mild nausea that she ran out of her nausea medicine yesterday and refill won't be available for a couple of days. No vomiting.  No diarrhea.  No constipation. Genitourinary: See history of present illness. Musculoskeletal: Negative for back pain. Skin: Negative for rash. Neurological: Negative for headaches, focal weakness or numbness.  10-point ROS otherwise negative.  ____________________________________________   PHYSICAL EXAM:  VITAL SIGNS: ED Triage Vitals  Enc Vitals Group     BP 10/16/15 1602 116/60     Pulse Rate 10/16/15 1600 (!) 107     Resp 10/16/15 1600 20     Temp 10/16/15 1600 99.4 F (37.4 C)     Temp Source 10/16/15 1600 Oral     SpO2 10/16/15 1600 97 %     Weight 10/16/15 1601 237 lb (107.5 kg)     Height 10/16/15 1601 5\' 5"  (1.651 m)     Head Circumference --      Peak Flow --      Pain Score 10/16/15 1601 4     Pain Loc --      Pain Edu? --      Excl. in [place]? --      Constitutional: Alert and oriented. Somewhat chronically ill-appearing, in generally fatigued in appearance. No acute abnormality noted. No distress. Eyes: Conjunctivae are normal. PERRL. EOMI. Head: Atraumatic. Nose: No congestion/rhinnorhea. Mouth/Throat: Mucous membranes are moist.  Oropharynx non-erythematous. Neck: No stridor.   Cardiovascular: Normal rate, regular rhythm. Grossly normal heart sounds.  Good peripheral circulation. Respiratory: Normal respiratory effort.  No retractions. Lungs CTAB. Gastrointestinal: Soft and nontender except for some mild suprapubic discomfort and fullness. No distention. No abdominal bruits. Musculoskeletal: No lower extremity tenderness though 1+ bilateral lower extremity edema, reported as chronic. Neurologic:  Normal speech and language. No gross focal neurologic deficits are appreciated.  Skin:  Skin is warm, dry and intact. No rash noted. Psychiatric: Mood and affect are flat. Speech and behavior are calm, appropriate.  ____________________________________________   LABS (all labs ordered are listed, but only abnormal results are displayed)  Labs Reviewed  BASIC METABOLIC PANEL - Abnormal; Notable for the following:       Result Value   Potassium 2.2 (*)    Chloride 95 (*)    Glucose, Bld 230 (*)    Calcium 8.2 (*)    All other components within normal limits  MAGNESIUM - Abnormal; Notable for the following:    Magnesium 1.6 (*)    All other components within normal limits  URINALYSIS COMPLETEWITH MICROSCOPIC ([facility] ONLY) - Abnormal; Notable for the following:    Color, Urine YELLOW (*)    APPearance CLEAR (*)    Glucose, UA 50 (*)    Protein, ur 30 (*)    Leukocytes, UA 2+ (*)    Bacteria, UA RARE (*)    Squamous Epithelial / LPF 0-5 (*)    All other components within normal limits  URINE CULTURE   ____________________________________________  EKG  Reviewed and interpreted by me at 1745 Heart rate 100 QRS 100 QTc  5:30 Reviewed and interpreted as sinus tachycardia, QT prolongation noted, ____________________________________________  RADIOLOGY   ____________________________________________   PROCEDURES  Procedure(s) performed: None  Procedures  Critical Care performed: No  ____________________________________________   INITIAL  IMPRESSION / ASSESSMENT AND PLAN / ED COURSE  Pertinent labs & imaging results that were available during my care of the patient were reviewed by me and considered in my medical decision making (see chart for details).  Patient with increasing generalized weakness, diagnosed with newly untreatable uterine cancer per notes from oncology. The patient had a Foley catheter placed without incident, but because she hadn't notable mild tachycardia and a minimal low-grade temperature with increasing generalized weakness labs were sent revealing a potassium 2.2. This is associated with a prolonged QT interval, because this patient will be admitted for further workup and also case management needs discussion with her caretaker about poor conditions and worsening debility at home  Discussed with Dr. Bridgett Larsson. CBC and urinalysis are currently pending at this time. Patient will be admitted. Patient agreeable.  Clinical Course     ____________________________________________   FINAL CLINICAL IMPRESSION(S) / ED DIAGNOSES  Final diagnoses:  Hypokalemia  General weakness  Prolonged Q-T interval on ECG  Urinary outflow obstruction  Prolonged QT Severe hypokalemia Generalized weakness    NEW MEDICATIONS STARTED DURING THIS VISIT:  New Prescriptions   No medications on file     Note:  This document was prepared using Dragon voice recognition software and may include unintentional dictation errors.     Delman Kitten, MD 10/16/15 1800

## 2015-10-17 LAB — BASIC METABOLIC PANEL
ANION GAP: 10 (ref 5–15)
BUN: 9 mg/dL (ref 6–20)
CALCIUM: 7.8 mg/dL — AB (ref 8.9–10.3)
CO2: 29 mmol/L (ref 22–32)
Chloride: 97 mmol/L — ABNORMAL LOW (ref 101–111)
Creatinine, Ser: 0.88 mg/dL (ref 0.44–1.00)
GFR calc Af Amer: >60 mL/min (ref >60)
Glucose, Bld: 203 mg/dL — ABNORMAL HIGH (ref 65–99)
POTASSIUM: 2.8 mmol/L — AB (ref 3.5–5.1)
SODIUM: 136 mmol/L (ref 135–145)

## 2015-10-17 LAB — CBC
HEMATOCRIT: 31.5 % — AB (ref 35.0–47.0)
HEMOGLOBIN: 10.3 g/dL — AB (ref 12.0–16.0)
MCH: 27.7 pg (ref 26.0–34.0)
MCHC: 32.8 g/dL (ref 32.0–36.0)
MCV: 84.4 fL (ref 80.0–100.0)
Platelets: 178 10*3/uL (ref 150–440)
RBC: 3.73 MIL/uL — ABNORMAL LOW (ref 3.80–5.20)
RDW: 14.8 % — ABNORMAL HIGH (ref 11.5–14.5)
WBC: 12.1 10*3/uL — AB (ref 3.6–11.0)

## 2015-10-17 LAB — MAGNESIUM: MAGNESIUM: 2.1 mg/dL (ref 1.7–2.4)

## 2015-10-17 MED ORDER — POTASSIUM CHLORIDE CRYS ER 20 MEQ PO TBCR
20.0000 meq | EXTENDED_RELEASE_TABLET | Freq: Three times a day (TID) | ORAL | Status: DC
  Administered 2015-10-17 – 2015-10-19 (×7): 20 meq via ORAL
  Filled 2015-10-17 (×7): qty 1

## 2015-10-17 MED ORDER — ENOXAPARIN SODIUM 40 MG/0.4ML ~~LOC~~ SOLN
40.0000 mg | Freq: Two times a day (BID) | SUBCUTANEOUS | Status: DC
  Administered 2015-10-17 – 2015-10-19 (×4): 40 mg via SUBCUTANEOUS
  Filled 2015-10-17 (×4): qty 0.4

## 2015-10-17 NOTE — Progress Notes (Signed)
Pt has had pain managed with medication this shift. Pt is alert and oriented but is slow to respond to questions and at times acts very sleepy. No nausea noted, no BM after receiving Miralax. 2 attempts on bedside commode with no results.

## 2015-10-17 NOTE — Care Management (Signed)
Annette [name] is very weak and still doesn't feel good. She wanted some more ice water and was ready for her breakfast. After checking with her Nurse I provided the water and while doing so saw the breakfast trays arriving, in which I let her know that her food should arrive shortly.

## 2015-10-17 NOTE — Progress Notes (Signed)
Annette [name] at [place] NAME: Annette [name]    MR#:  [id number]  DATE OF BIRTH:  January 28, 1961  SUBJECTIVE:   Pt. Here due to weakness and noted be severely hypokalemic.  Noted to have urinary retention and had foley cath replaced yesterday.  Feels Weak still.      REVIEW OF SYSTEMS:    Review of Systems  Constitutional: Negative for chills and fever.  HENT: Negative for congestion and tinnitus.   Eyes: Negative for blurred vision and double vision.  Respiratory: Negative for cough, shortness of breath and wheezing.   Cardiovascular: Negative for chest pain, orthopnea and PND.  Gastrointestinal: Negative for abdominal pain, diarrhea, nausea and vomiting.  Genitourinary: Negative for dysuria and hematuria.  Neurological: Positive for weakness. Negative for dizziness, sensory change and focal weakness.  All other systems reviewed and are negative.   Nutrition: REgular  Tolerating Diet: Yes Tolerating PT: Await eval.    DRUG ALLERGIES:   Allergies  Allergen Reactions  . Iodides Swelling  . Iodine Swelling  . Shellfish Allergy Swelling    shrimp causes swelling  . Shellfish-Derived Products Swelling  . Iodinated Diagnostic Agents Swelling  . Povidone Iodine Swelling    eyes  . Povidone-Iodine Swelling    VITALS:  Blood pressure 131/83, pulse (!) 109, temperature 98.6 F (37 C), temperature source Oral, resp. rate 18, height 5\' 5"  (1.651 m), weight 112.6 kg (248 lb 3.2 oz), SpO2 93 %.  PHYSICAL EXAMINATION:   Physical Exam  GENERAL:  55 y.o.-year-old patient lying in the bed lethargic but in NAD.   EYES: Pupils equal, round, reactive to light and accommodation. No scleral icterus. Extraocular muscles intact.  HEENT: Head atraumatic, normocephalic. Oropharynx and nasopharynx clear.  NECK:  Supple, no jugular venous distention. No thyroid enlargement, no tenderness.  LUNGS: Normal breath sounds bilaterally, no wheezing, rales,  rhonchi. No use of accessory muscles of respiration.  CARDIOVASCULAR: S1, S2 normal. No murmurs, rubs, or gallops.  ABDOMEN: Soft, nontender, nondistended. Bowel sounds present. No organomegaly or mass.  EXTREMITIES: No cyanosis, clubbing or edema b/l.    NEUROLOGIC: Cranial nerves II through XII are intact. No focal Motor or sensory deficits b/l. Globally weak.    PSYCHIATRIC: The patient is alert and oriented x 3.  SKIN: No obvious rash, lesion, or ulcer.    LABORATORY PANEL:   CBC  Recent Labs Lab 10/17/15 0609  WBC 12.1*  HGB 10.3*  HCT 31.5*  PLT 178   ------------------------------------------------------------------------------------------------------------------  Chemistries   Recent Labs Lab 10/15/15 0827  10/17/15 0609  NA 135  < > 136  K 2.5*  < > 2.8*  CL 95*  < > 97*  CO2 27  < > 29  GLUCOSE 197*  < > 203*  BUN 10  < > 9  CREATININE 0.85  < > 0.88  CALCIUM 8.3*  < > 7.8*  MG  --   < > 2.1  AST 34  --   --   ALT 24  --   --   ALKPHOS 102  --   --   BILITOT 0.9  --   --   < > = values in this interval not displayed. ------------------------------------------------------------------------------------------------------------------  Cardiac Enzymes No results for input(s): TROPONINI in the last 168 hours. ------------------------------------------------------------------------------------------------------------------  RADIOLOGY:  No results found.   ASSESSMENT AND PLAN:   55 year old female with past medical history of uterine cancer, hypertension, hyperlipidemia, diabetes, DJD who presented to the  hospital due to Urinary retention came and had foley cath replaced and then noted to be hYpokalemic.  1. Acute hypokalemia-secondary to use of diuretics and dehydration. -Continue to replace and repeat level in the morning. Magnesium level is normal now.  2. Urinary retention-secondary to uterine cancer-status post chronic Foley now which has not been  replaced. -Continue further care as per urology and oncologist as an outpatient.  3. Essential hypertension-continue lisinopril.  4. Urinary tract infection-continue IV ceftriaxone, follow urine cultures.  5. History of depression-continue Wellbutrin.  6. Chronic pain-continue Dilaudid, Nucynta.  All the records are reviewed and case discussed with Care Management/Social Worker. Management plans discussed with the patient, family and they are in agreement.  CODE STATUS: Full  DVT Prophylaxis: Lovenox  TOTAL TIME TAKING CARE OF THIS PATIENT: 30 minutes.   POSSIBLE D/C IN 1-2 DAYS, DEPENDING ON CLINICAL CONDITION.   Henreitta Leber M.D on 10/17/2015 at 1:05 PM  Between 7am to 6pm - Pager - [phone]  After 6pm go to [email or web address] - Proofreader  [facility]  Office  [phone]  CC: Primary care physician; Annette Sia, MD

## 2015-10-17 NOTE — Progress Notes (Signed)
55 y/o F on Lovenox 40 mg daily for DVT prophylaxis. Due to weight > 100 kg and BMI > 40, will increase Lovenox dosing to 40 mg q 12 hours.   Ulice Dash, PharmD Clinical Pharmacist

## 2015-10-17 NOTE — Progress Notes (Signed)
   10/17/15 1311  PT Visit Information  Reason Eval/Treat Not Completed Medical issues which prohibited therapy (Pt's potassium 2.8; will re-attempt PT eval once patient's lab values have improved and she is more appropriate. )  Hillis Range, PT, DPT, [phone] 10/17/15 1:13 PM

## 2015-10-17 NOTE — Progress Notes (Signed)
Patient has been drowsy all day, but it improved throughout the afternoon after night time medications started getting out of her system. PRN dilaudid only given once due to patient's drowsiness. Able to assist to bedside commode with 1-2 person assist depending on drowsy patient is. Potassium is still low at 2.8, given PO replacement with no difficulty. Patient's appetite is poor, per family this is her baseline.

## 2015-10-17 NOTE — Progress Notes (Addendum)
Initial Nutrition Assessment  DOCUMENTATION CODES:   Obesity unspecified  INTERVENTION:  Magic cup TID with meals, each supplement provides 290 kcal and 9 grams of protein  NUTRITION DIAGNOSIS:   Inadequate oral intake related to poor appetite, cancer and cancer related treatments as evidenced by per patient/family report.  GOAL:   Patient will meet greater than or equal to 90% of their needs  MONITOR:   PO intake, Labs, Skin, I & O's, Weight trends  REASON FOR ASSESSMENT:   Malnutrition Screening Tool    ASSESSMENT:   Annette [name]  is a 55 y.o. female with a known history of Bladder cancer, hypertension, hyperlipidemia, diabetes and back pain. The patient was sent to the ED due to Foley catheter dislodged. she's had the Foley catheter in because of urinary blockage associated with cancer. The Foley catheter is replaced in the ED.  Spoke with pt and her caregiver at bedside. Pt has poor appetite, per caregiver this has persisted for 2-3 months. Pt would eat yogurt and ice cream, small meals at home. Endorses significant wt loss with UBW of 280#, but chart review shows pt being 129 kgs 09/08 and 112.6 kgs 09/09 - likely an error when looking at the rest of her wt hx. Pt's caregiver states pt consumes Ensure and protein shakes at home, will provide given lack of PO intake. Documented breakfast intake 25%, only meal documented thus far. Labs and medications reviewed. Nutrition-Focused physical exam completed. Findings are no fat depletion, no muscle depletion, and no edema.   Diet Order:  Diet regular Room service appropriate? Yes; Fluid consistency: Thin  Skin:  Wound (see comment) (Burnt skin on R Hip)  Last BM:  PTA  Height:   Ht Readings from Last 1 Encounters:  10/16/15 5\' 5"  (1.651 m)    Weight:   Wt Readings from Last 1 Encounters:  10/16/15 248 lb 3.2 oz (112.6 kg)    Ideal Body Weight:  56.81 kg  BMI:  Body mass index is 41.3 kg/m.  Estimated Nutritional  Needs:   Kcal:  [id number] calories (25-30 cal/kg ABW)  Protein:  57-68 gm  Fluid:  >/= 1.7L  EDUCATION NEEDS:   No education needs identified at this time  Satira Anis. Amoy Steeves, MS, RD LDN Inpatient Clinical Dietitian Pager [phone]

## 2015-10-18 LAB — BASIC METABOLIC PANEL
Anion gap: 8 (ref 5–15)
BUN: 7 mg/dL (ref 6–20)
CHLORIDE: 101 mmol/L (ref 101–111)
CO2: 28 mmol/L (ref 22–32)
Calcium: 8.2 mg/dL — ABNORMAL LOW (ref 8.9–10.3)
Creatinine, Ser: 0.83 mg/dL (ref 0.44–1.00)
GFR calc Af Amer: >60 mL/min (ref >60)
GFR calc non Af Amer: >60 mL/min (ref >60)
GLUCOSE: 237 mg/dL — AB (ref 65–99)
POTASSIUM: 3.6 mmol/L (ref 3.5–5.1)
Sodium: 137 mmol/L (ref 135–145)

## 2015-10-18 MED ORDER — ALPRAZOLAM 1 MG PO TABS
1.0000 mg | ORAL_TABLET | Freq: Two times a day (BID) | ORAL | Status: DC | PRN
  Administered 2015-10-18 – 2015-10-19 (×2): 1 mg via ORAL
  Filled 2015-10-18 (×2): qty 1

## 2015-10-18 MED ORDER — BISACODYL 10 MG RE SUPP
10.0000 mg | Freq: Once | RECTAL | Status: AC
  Administered 2015-10-18: 10 mg via RECTAL
  Filled 2015-10-18: qty 1

## 2015-10-18 NOTE — Plan of Care (Signed)
Problem: Safety: Goal: Ability to remain free from injury will improve Outcome: Not Progressing Pt unsteady on her feet but is refusing to use call ball, keeps getting up by herself despite numerous warnings from staff.

## 2015-10-18 NOTE — Evaluation (Signed)
Physical Therapy Evaluation Patient Details Name: Annette [name] MRN: [id number] DOB: Jul 02, 1960 Today's Date: 10/18/2015   History of Present Illness  Pt admitted for hypokalemia. Pt with complaints of urinary retention and multiple falls in home. Pt with history of bladder cancer, HTN, hyperlipidemia, DM, and asthma.   Clinical Impression  Pt is a concerned 55 year old female who was admitted for hypokalemia. Pt performs bed mobility with supervision and transfers/ambulation with min assist using RW. Pt previously independent, however has been having multiple falls at home and currently lives alone. Pt demonstrates deficits with strength/mobility/endurance/pain. Need RW at this time for all mobility as she is a high falls risk secondary to impulsive nature and unsteadiness. Would benefit from skilled PT to address above deficits and promote optimal return to PLOF recommend transition to STR upon discharge from acute hospitalization.       Follow Up Recommendations SNF    Equipment Recommendations       Recommendations for Other Services       Precautions / Restrictions Precautions Precautions: Fall Restrictions Weight Bearing Restrictions: No      Mobility  Bed Mobility Overal bed mobility: Needs Assistance Bed Mobility: Supine to Sit     Supine to sit: Supervision     General bed mobility comments: Safe technique performed. Once seated, can sit with independence. Pt slightly impulsive  Transfers Overall transfer level: Needs assistance Equipment used: Rolling walker (2 wheeled) Transfers: Sit to/from Stand Sit to Stand: Min assist         General transfer comment: Pt slightly impulsive and needs cues to wait for therapist. Pt very unsteady once standing, requiring min assist to maintain balance. No symptoms of dizziness described.   Ambulation/Gait Ambulation/Gait assistance: Min assist Ambulation Distance (Feet): 10 Feet Assistive device: Rolling walker (2  wheeled) Gait Pattern/deviations: Step-to pattern     General Gait Details: ambulated to [facility] and then to recliner. Pt requires cues for technique and for safety. Pt tries to leave RW behind and turn with fast technique increasing risk for falls. Pt unable to ambulate further distance at this time.   Stairs            Wheelchair Mobility    Modified Rankin (Stroke Patients Only)       Balance Overall balance assessment: Needs assistance;History of Falls Sitting-balance support: Feet supported Sitting balance-Leahy Scale: Normal     Standing balance support: Bilateral upper extremity supported Standing balance-Leahy Scale: Fair                               Pertinent Vitals/Pain Pain Assessment: Faces Faces Pain Scale: Hurts even more Pain Location: "all over" Pain Descriptors / Indicators: Discomfort Pain Intervention(s): Premedicated before session;Repositioned    Home Living Family/patient expects to be discharged to:: Private residence Living Arrangements: Alone Available Help at Discharge: Family Type of Home: House Home Access: Level entry     Home Layout: One level Home Equipment: Environmental consultant - 2 wheels;Bedside commode      Prior Function Level of Independence: Independent         Comments: has previously been independent, however using RW past few days.     Hand Dominance        Extremity/Trunk Assessment   Upper Extremity Assessment: Overall WFL for tasks assessed           Lower Extremity Assessment: Generalized weakness (Grossly 3+/5)  Communication   Communication: No difficulties  Cognition Arousal/Alertness: Awake/alert Behavior During Therapy: Flat affect Overall Cognitive Status: Within Functional Limits for tasks assessed                      General Comments      Exercises Other Exercises Other Exercises: Pt ambulated to [facility] with cues for safety and reaching back for seated surface prior to  transfers. Pt needs cga for hygiene.      Assessment/Plan    PT Assessment Patient needs continued PT services  PT Diagnosis Difficulty walking;Generalized weakness;Acute pain   PT Problem List Decreased strength;Decreased activity tolerance;Decreased balance;Decreased mobility;Pain  PT Treatment Interventions DME instruction;Gait training;Therapeutic exercise   PT Goals (Current goals can be found in the Care Plan section) Acute Rehab PT Goals Patient Stated Goal: to go back home with more support PT Goal Formulation: With patient Time For Goal Achievement: 11/01/15 Potential to Achieve Goals: Good    Frequency Min 2X/week   Barriers to discharge Decreased caregiver support      Co-evaluation               End of Session Equipment Utilized During Treatment: Gait belt Activity Tolerance: Patient limited by pain Patient left: in chair;with chair alarm set Nurse Communication: Mobility status         Time: 0940-1005 PT Time Calculation (min) (ACUTE ONLY): 25 min   Charges:   PT Evaluation $PT Eval Low Complexity: 1 Procedure PT Treatments $Therapeutic Activity: 8-22 mins   PT G Codes:        Nyshawn Gowdy 22-Oct-2015, 10:43 AM  Greggory Stallion, PT, DPT [phone]

## 2015-10-18 NOTE — Progress Notes (Signed)
Inpatient Diabetes Program Recommendations  AACE/ADA: New Consensus Statement on Inpatient Glycemic Control (2015)  Target Ranges:  Prepandial:   less than 140 mg/dL      Peak postprandial:   less than 180 mg/dL (1-2 hours)      Critically ill patients:  140 - 180 mg/dL   Lab Results  Component Value Date   GLUCAP 196 (H) 09/21/2015   HGBA1C 6.6 (H) 08/24/2015    Review of Glycemic Control  Diabetes history: DM2 Outpatient Diabetes medications: Metformin XR 500 mg QAM Current orders for Inpatient glycemic control: None  Inpatient Diabetes Program Recommendations: Correction (SSI): While inpatient, please consider ordering CBGs with Novolog correction scale. Diet: Please consider changing diet from Regular to CARB MODIFIED.  Thank you,  Windy Carina, RN, BSN Diabetes Coordinator Inpatient Diabetes Program [phone] (Team Pager) [phone] (AP office) [phone] [facility] office) [phone] [facility] office)

## 2015-10-18 NOTE — Care Management (Signed)
Spoke with patient and her medicaid pcs caregiver- Artis Bergfeld -no relation-  (thru [employer]).  has medicaid pcs for 2 hours a day and it is voiced that patient needs increase in hours.  Patient does not have a HCPOA.  She has two children DJ and LaToya.  Patient gave CM  permission to call her daughter. A referral was sent to [facility] after hours last Friday.   Artis mentions that patient lives alone and is falling a lot and needs someone to stay the night with her.  Discussed that hospice does not provide continuous in home care.  Contact Craige Cotta with [facility].

## 2015-10-18 NOTE — Care Management Note (Signed)
Case Management Note  Patient Details  Name: Annette [name] MRN: [id number] Date of Birth: [birth year]  Subjective/Objective:                 Presented to ED with chronic foley dislodgement, which was replaced.  Patient complained of severe weakness and found to have potassium of 2.2.  She has diagnosis of metastatic uterine cancer.  She is being followed by [facility].  It appears that a referral has been made to [facility] on 9.5.2017.  She is followed by Pain Clinic.  Palliative Care consult may  be of benefit.  Patient is a full code.  Action/Plan:   Expected Discharge Date:                  Expected Discharge Plan:     In-House Referral:   Possible palliative care consult  Discharge planning Services     Post Acute Care Choice:    Choice offered to:     DME Arranged:    DME Agency:     HH Arranged:    HH Agency:     Status of Service:     If discussed at [employer] of Stay Meetings, dates discussed:    Additional Comments:  Katrina Stack, RN 10/18/2015, 8:04 AM

## 2015-10-18 NOTE — Care Management Important Message (Signed)
Important Message  Patient Details  Name: Annette [name] MRN: [id number] Date of Birth: [birth year]   Medicare Important Message Given:  Yes    Katrina Stack, RN 10/18/2015, 12:30 PM

## 2015-10-18 NOTE — Progress Notes (Signed)
[place] at [place] NAME: Annette [name]    MR#:  [id number]  DATE OF BIRTH:  1960/11/05  SUBJECTIVE:   Hypokalemia much improved. Still complaining of generalized weakness. Patient not complaining of generalized myalgias and back pain.  REVIEW OF SYSTEMS:    Review of Systems  Constitutional: Negative for chills and fever.  HENT: Negative for congestion and tinnitus.   Eyes: Negative for blurred vision and double vision.  Respiratory: Negative for cough, shortness of breath and wheezing.   Cardiovascular: Negative for chest pain, orthopnea and PND.  Gastrointestinal: Positive for constipation. Negative for abdominal pain, diarrhea, nausea and vomiting.  Genitourinary: Negative for dysuria and hematuria.  Musculoskeletal: Positive for back pain and myalgias.  Neurological: Positive for weakness. Negative for dizziness, sensory change and focal weakness.  All other systems reviewed and are negative.   Nutrition: REgular  Tolerating Diet: Yes Tolerating PT: Eval noted.   DRUG ALLERGIES:   Allergies  Allergen Reactions  . Iodides Swelling  . Iodine Swelling  . Shellfish Allergy Swelling    shrimp causes swelling  . Shellfish-Derived Products Swelling  . Iodinated Diagnostic Agents Swelling  . Povidone Iodine Swelling    eyes  . Povidone-Iodine Swelling    VITALS:  Blood pressure 109/61, pulse 95, temperature 98.4 F (36.9 C), temperature source Oral, resp. rate 19, height 5\' 5"  (1.651 m), weight 112.6 kg (248 lb 3.2 oz), SpO2 98 %.  PHYSICAL EXAMINATION:   Physical Exam  GENERAL:  55 y.o.-year-old patient lying in the bed lethargic but in NAD.   EYES: Pupils equal, round, reactive to light and accommodation. No scleral icterus. Extraocular muscles intact.  HEENT: Head atraumatic, normocephalic. Oropharynx and nasopharynx clear.  NECK:  Supple, no jugular venous distention. No thyroid enlargement, no tenderness.  LUNGS:  Normal breath sounds bilaterally, no wheezing, rales, rhonchi. No use of accessory muscles of respiration.  CARDIOVASCULAR: S1, S2 normal. No murmurs, rubs, or gallops.  ABDOMEN: Soft, nontender, nondistended. Bowel sounds present. No organomegaly or mass.  EXTREMITIES: No cyanosis, clubbing or edema b/l.    NEUROLOGIC: Cranial nerves II through XII are intact. No focal Motor or sensory deficits b/l. Globally weak.    PSYCHIATRIC: The patient is alert and oriented x 3.  SKIN: No obvious rash, lesion, or ulcer.    LABORATORY PANEL:   CBC  Recent Labs Lab 10/17/15 0609  WBC 12.1*  HGB 10.3*  HCT 31.5*  PLT 178   ------------------------------------------------------------------------------------------------------------------  Chemistries   Recent Labs Lab 10/15/15 0827  10/17/15 0609 10/18/15 0434  NA 135  < > 136 137  K 2.5*  < > 2.8* 3.6  CL 95*  < > 97* 101  CO2 27  < > 29 28  GLUCOSE 197*  < > 203* 237*  BUN 10  < > 9 7  CREATININE 0.85  < > 0.88 0.83  CALCIUM 8.3*  < > 7.8* 8.2*  MG  --   < > 2.1  --   AST 34  --   --   --   ALT 24  --   --   --   ALKPHOS 102  --   --   --   BILITOT 0.9  --   --   --   < > = values in this interval not displayed. ------------------------------------------------------------------------------------------------------------------  Cardiac Enzymes No results for input(s): TROPONINI in the last 168 hours. ------------------------------------------------------------------------------------------------------------------  RADIOLOGY:  No results found.   ASSESSMENT  AND PLAN:   55 year old female with past medical history of uterine cancer, hypertension, hyperlipidemia, diabetes, DJD who presented to the hospital due to Urinary retention came and had foley cath replaced and then noted to be hYpokalemic.  1. Acute hypokalemia-secondary to use of diuretics and dehydration. -Much improved with supplementation. -Magnesium level was  normal.  2. Urinary retention-secondary to uterine cancer-status post chronic Foley now which has now been replaced in the ER as it was clogged.  Working well now.   -Continue further care as per urology and oncologist as an outpatient.  3. Essential hypertension-continue lisinopril.  4. Urinary tract infection-continue IV ceftriaxone,  - urine cultures growing E. Faecalis and will follow sensitivities.   5. History of depression-continue Wellbutrin.  6. Chronic pain-continue Dilaudid, Nucynta. - pt. Asking for more pain meds but I will hold of on doing that for now.   7. Constipation - due to chronic pain meds.  - cont. Miralax, Lactulose. Will give Duloclax supp. And monitor.   PT eval noted and pt. Needs SNF/STR and CM to talk to POA about that.    All the records are reviewed and case discussed with Care Management/Social Worker. Management plans discussed with the patient, family and they are in agreement.  CODE STATUS: Full  DVT Prophylaxis: Lovenox  TOTAL TIME TAKING CARE OF THIS PATIENT: 30 minutes.   POSSIBLE D/C IN 1-2 DAYS, DEPENDING ON CLINICAL CONDITION.   Henreitta Leber M.D on 10/18/2015 at 2:44 PM  Between 7am to 6pm - Pager - [phone]  After 6pm go to [email or web address] - Proofreader  [facility]  Office  [phone]  CC: Primary care physician; Ellamae Sia, MD

## 2015-10-18 NOTE — Care Management (Signed)
Annette [name] seems very upset and unsettled in her disposition today. While in conversation yesterday she seemed to still be in pain, but was more welcoming. Today, she has still not been able to pass her stool and therefore is experiencing much discomfort. The Chaplain sat with her and talked as much as she could muster. I think she is lonely and with the state of her health, becomes filled with more anxiety and depression at times. One of her care givers is here, she seems to be a little better emotionally with his arrival and presence.

## 2015-10-18 NOTE — Telephone Encounter (Addendum)
reviewed chart- pt currently in patient status at [facility]. Will hold off for home health referral at this time.  It was my understanding from the last office visit with patient that the patient has a personal hired nursing aid support at home during the day.  Family needs a night sitter for patient per Artis-care giver. Artis works a 2nd job and is unable to care for patient at night.  At last phone call (on 10/12/15) to daughter, daughter was requesting [facility] home health. She stated at that time, pt's pcp was making this referral.  Letter written per daughter's request and sent to [facility] last week.

## 2015-10-18 NOTE — Care Management (Signed)
Patient has been evaluated by physical therapy and the recommendation is skilled nursing.  Patient is in agreement. Spoke with patient's daughter  Phineas Real. She discussed that family members that had been available to stay with the patient at night, have all gone back to school.  She had called cancer cent physician for assistance with finding someone to stay with patient at night.  Discussed that Hospice is not able to provide inhome continuous care.  Discussed the recommendation of skilled nursing placement.  Then discussed potential discharge dispositions:   SNF with palliative care under medicare; IF unable to leave SNF- transition to a medicaid long term care plan with hospice; Return home with hospice- but would have to have round the clock caregivers.  She does not want [facility].  First preference would be [facility].  Discussed HCPOA with patient and daughter and that this could be initiated while patient is here this admission.  Anticipate discharge to snf 9.12.2017.  Craige Cotta with [facility] updated.

## 2015-10-19 ENCOUNTER — Other Ambulatory Visit: Payer: Self-pay

## 2015-10-19 LAB — URINE CULTURE: Special Requests: NORMAL

## 2015-10-19 LAB — BASIC METABOLIC PANEL
Anion gap: 7 (ref 5–15)
BUN: 7 mg/dL (ref 6–20)
CALCIUM: 8.4 mg/dL — AB (ref 8.9–10.3)
CHLORIDE: 101 mmol/L (ref 101–111)
CO2: 30 mmol/L (ref 22–32)
CREATININE: 0.8 mg/dL (ref 0.44–1.00)
GFR calc non Af Amer: >60 mL/min (ref >60)
GLUCOSE: 226 mg/dL — AB (ref 65–99)
Potassium: 3.4 mmol/L — ABNORMAL LOW (ref 3.5–5.1)
Sodium: 138 mmol/L (ref 135–145)

## 2015-10-19 LAB — GLUCOSE, CAPILLARY: Glucose-Capillary: 292 mg/dL — ABNORMAL HIGH (ref 65–99)

## 2015-10-19 MED ORDER — HYDROMORPHONE HCL 2 MG PO TABS: 2.0000 mg | ORAL_TABLET | ORAL | 0 refills | Status: AC | PRN

## 2015-10-19 MED ORDER — INSULIN ASPART 100 UNIT/ML ~~LOC~~ SOLN
0.0000 [IU] | Freq: Three times a day (TID) | SUBCUTANEOUS | Status: DC
  Administered 2015-10-19: 5 [IU] via SUBCUTANEOUS
  Filled 2015-10-19: qty 5

## 2015-10-19 MED ORDER — CEPHALEXIN 500 MG PO CAPS: 500.0000 mg | ORAL_CAPSULE | Freq: Two times a day (BID) | ORAL | 0 refills | Status: AC

## 2015-10-19 MED ORDER — ALPRAZOLAM 2 MG PO TABS
2.0000 mg | ORAL_TABLET | Freq: Two times a day (BID) | ORAL | 0 refills | Status: AC | PRN
Start: 2015-10-19 — End: ?

## 2015-10-19 MED ORDER — TAPENTADOL HCL ER 200 MG PO TB12: 1.0000 | ORAL_TABLET | Freq: Two times a day (BID) | ORAL | 0 refills | Status: AC

## 2015-10-19 MED ORDER — MEGESTROL ACETATE 40 MG PO TABS: 40.0000 mg | ORAL_TABLET | Freq: Every day | ORAL | Status: AC

## 2015-10-19 MED ORDER — INSULIN ASPART 100 UNIT/ML ~~LOC~~ SOLN: 0.0000 [IU] | Freq: Every day | SUBCUTANEOUS | Status: DC

## 2015-10-19 NOTE — Progress Notes (Signed)
Physical Therapy Treatment Patient Details Name: Annette [name] MRN: [id number] DOB: 09/25/1960 Today's Date: 10/19/2015    History of Present Illness Pt admitted for hypokalemia. Pt with complaints of urinary retention and multiple falls in home. Pt with history of bladder cancer, HTN, hyperlipidemia, DM, and asthma.     PT Comments    Pt is making improved progress towards goals with improved motivated to ambulate this date. Pt still severely limited by pain and required multiple rest breaks during ambulation/ther-ex secondary to pain. Pt with unsteadiness and near LOB towards R side. Pt fatigues quickly. [facility], however continues to be unsuccessful in having a BM.  Follow Up Recommendations  SNF     Equipment Recommendations       Recommendations for Other Services       Precautions / Restrictions Precautions Precautions: Fall Restrictions Weight Bearing Restrictions: No    Mobility  Bed Mobility Overal bed mobility: Needs Assistance Bed Mobility: Supine to Sit     Supine to sit: Supervision     General bed mobility comments: Safe technique performed. Once seated, can sit with independence. Pt slightly impulsive  Transfers Overall transfer level: Needs assistance Equipment used: Rolling walker (2 wheeled) Transfers: Sit to/from Stand Sit to Stand: Min assist         General transfer comment: Pt slightly impulsive and needs cues to wait for therapist. Pt very unsteady once standing, requiring min assist to maintain balance. No symptoms of dizziness described.   Ambulation/Gait Ambulation/Gait assistance: Min assist Ambulation Distance (Feet): 40 Feet Assistive device: Rolling walker (2 wheeled) Gait Pattern/deviations: Step-through pattern     General Gait Details: [facility] and then ambulated further in room. Pt complains of pain during ambulation, unable further ambulate at this time. Unsteadiness noted with R knee slightly  buckling.   Stairs            Wheelchair Mobility    Modified Rankin (Stroke Patients Only)       Balance                                    Cognition Arousal/Alertness: Awake/alert Behavior During Therapy: Flat affect Overall Cognitive Status: Within Functional Limits for tasks assessed                      Exercises Other Exercises Other Exercises: [facility] for safety and reaching back for seated surface prior to transfers. Pt needs cga for hygiene. Other Exercises: Pt performed supine ther-ex including ankle pumps, quad sets, SLRs, and glut sets. All ther-ex performed on B LE x 10 reps with cga. Pt further performed weighted elbow flexion/extension x 10 reps with cga. Safe technique performed    General Comments        Pertinent Vitals/Pain Pain Assessment: 0-10 Pain Score: 8  Pain Location: "all over" Pain Descriptors / Indicators: Operative site guarding Pain Intervention(s): Limited activity within patient's tolerance    Home Living                      Prior Function            PT Goals (current goals can now be found in the care plan section) Acute Rehab PT Goals Patient Stated Goal: to get some rehab PT Goal Formulation: With patient Time For Goal Achievement: 11/01/15 Potential to Achieve  Goals: Good Progress towards PT goals: Progressing toward goals    Frequency  Min 2X/week    PT Plan Current plan remains appropriate    Co-evaluation             End of Session Equipment Utilized During Treatment: Gait belt Activity Tolerance: Patient limited by pain Patient left: in bed;with bed alarm set     Time: 1004-1027 PT Time Calculation (min) (ACUTE ONLY): 23 min  Charges:  $Gait Training: 8-22 mins $Therapeutic Exercise: 8-22 mins                    G Codes:      Anaira Seay 10-28-2015, 11:16 AM  Greggory Stallion, PT, DPT [phone]

## 2015-10-19 NOTE — Discharge Summary (Signed)
[facility] [place] NAME: Annette [name]    MR#:  [id number]  DATE OF BIRTH:  04/21/1960  DATE OF ADMISSION:  10/16/2015 ADMITTING PHYSICIAN: Demetrios Loll, MD  DATE OF DISCHARGE: 10/19/2015  PRIMARY CARE PHYSICIAN: Harriett Neita Carp, MD    ADMISSION DIAGNOSIS:  Hypokalemia [E87.6] Prolonged Q-T interval on ECG [I45.81] General weakness [R53.1] Urinary outflow obstruction [N13.9]  DISCHARGE DIAGNOSIS:  Principal Problem:   Hypokalemia   SECONDARY DIAGNOSIS:   Past Medical History:  Diagnosis Date  . Asthma   . Back pain   . Bladder absent    unable to urination- in hospital  . Cancer [facility])   . DDD (degenerative disc disease), lumbar   . Depression   . Diabetes mellitus without complication ([place])   . Hyperlipidemia   . Hypertension   . Insomnia   . Menopausal syndrome   . Obesity   . Onychomycosis   . Peripheral neuropathy ([place])   . Renal insufficiency     HOSPITAL COURSE:   55 year old female with past medical history of uterine cancer, hypertension, hyperlipidemia, diabetes, DJD who presented to the hospital due to Urinary retention came and had foley cath replaced and then noted to be hYpokalemic.  1. Acute hypokalemia-secondary to use of diuretics and dehydration. -This has significantly improved with supplementation and now resolved. -Patient will continue some oral potassium supplements.  2. Urinary retention-secondary to uterine cancer-status post chronic Foley now which has now been replaced in the ER as it was clogged.  Working well now.   -Continue further care as per urology and oncologist as an outpatient. - added Megace as appetite stimulant.   3. Essential hypertension- she will continue lisinopril.  4. Urinary tract infection- pt's urine cultures + for E. Faecalis.  - pt. Was treated w/ IV Ceftriaxone in the hospital and now being discharged on Oral Keflex.    5. History of depression-she will continue  Wellbutrin.  6. Chronic pain- she will continue Dilaudid, Nucynta.  7. Constipation - due to chronic pain meds. - improved w/ Miralax, Lactulose and Dulolax.    8. DM - while in the hospital pt. Was on SSI and now can resume her metformin upon discharged.   Seen by Physical therapy and they recommend SNF and she is being discharged there presently.   DISCHARGE CONDITIONS:   Stable.   CONSULTS OBTAINED:    DRUG ALLERGIES:   Allergies  Allergen Reactions  . Iodides Swelling  . Iodine Swelling  . Shellfish Allergy Swelling    shrimp causes swelling  . Shellfish-Derived Products Swelling  . Iodinated Diagnostic Agents Swelling  . Povidone Iodine Swelling    eyes  . Povidone-Iodine Swelling    DISCHARGE MEDICATIONS:     Medication List    STOP taking these medications   hydrochlorothiazide 25 MG tablet Commonly known as:  HYDRODIURIL   Oxycodone HCl 10 MG Tabs     TAKE these medications   alprazolam 2 MG tablet Commonly known as:  XANAX Take 1 tablet (2 mg total) by mouth 2 (two) times daily as needed for anxiety.   aspirin EC 81 MG tablet Take 81 mg by mouth every morning.   buPROPion 150 MG 12 hr tablet Commonly known as:  WELLBUTRIN SR Take 1 tablet (150 mg total) by mouth daily.   cephALEXin 500 MG capsule Commonly known as:  KEFLEX Take 1 capsule (500 mg total) by mouth 2 (two) times daily.   gabapentin 600  MG tablet Commonly known as:  NEURONTIN Limit 1 tablet by mouth twice per day if tolerated   HYDROmorphone 2 MG tablet Commonly known as:  DILAUDID Take 1 tablet (2 mg total) by mouth every 3 (three) hours as needed for severe pain.   lactulose 10 GM/15ML solution Commonly known as:  CHRONULAC Take 30 g by mouth 2 (two) times daily as needed for mild constipation or moderate constipation.   lisinopril 10 MG tablet Commonly known as:  PRINIVIL,ZESTRIL Take 10 mg by mouth every morning.   megestrol 40 MG tablet Commonly known as:   MEGACE Take 1 tablet (40 mg total) by mouth daily.   metFORMIN 500 MG 24 hr tablet Commonly known as:  GLUCOPHAGE-XR Take 500 mg by mouth daily with breakfast.   oxybutynin 5 MG tablet Commonly known as:  DITROPAN Take 1 tablet (5 mg total) by mouth every 8 (eight) hours as needed for bladder spasms.   polyethylene glycol packet Commonly known as:  MIRALAX / GLYCOLAX Take 17 g by mouth 2 (two) times daily. What changed:  when to take this  reasons to take this   Potassium Chloride ER 20 MEQ Tbcr Take 1 tablet by mouth 2 (two) times daily.   pravastatin 40 MG tablet Commonly known as:  PRAVACHOL Take 40 mg by mouth at bedtime.   senna-docusate 8.6-50 MG tablet Commonly known as:  Senokot-S Take 2 tablets by mouth daily.   silver sulfADIAZINE 1 % cream Commonly known as:  SILVADENE Apply topically daily. What changed:  when to take this   Tapentadol HCl 200 MG Tb12 Commonly known as:  NUCYNTA ER Take 1 tablet by mouth every 12 (twelve) hours.   tiZANidine 2 MG tablet Commonly known as:  ZANAFLEX Limit 1 tablet by mouth 2-4 times per day if tolerated   traZODone 150 MG tablet Commonly known as:  DESYREL Take 300 mg by mouth at bedtime as needed for sleep.   Vilazodone HCl 20 MG Tabs Commonly known as:  VIIBRYD Take 1 tablet (20 mg total) by mouth daily. Takes with 40mg  for total of 60mg  daily   Vilazodone HCl 40 MG Tabs Commonly known as:  VIIBRYD Take 1 tablet (40 mg total) by mouth daily. Takes with 20mg  for total of 60mg  daily         DISCHARGE INSTRUCTIONS:   DIET:  Cardiac diet and Diabetic diet  DISCHARGE CONDITION:  Stable  ACTIVITY:  Activity as tolerated  OXYGEN:  Home Oxygen: No.   Oxygen Delivery: room air  DISCHARGE LOCATION:  nursing home   If you experience worsening of your admission symptoms, develop shortness of breath, life threatening emergency, suicidal or homicidal thoughts you must seek medical attention immediately by  calling 911 or calling your MD immediately  if symptoms less severe.  You Must read complete instructions/literature along with all the possible adverse reactions/side effects for all the Medicines you take and that have been prescribed to you. Take any new Medicines after you have completely understood and accpet all the possible adverse reactions/side effects.   Please note  You were cared for by a hospitalist during your hospital stay. If you have any questions about your discharge medications or the care you received while you were in the hospital after you are discharged, you can call the unit and asked to speak with the hospitalist on call if the hospitalist that took care of you is not available. Once you are discharged, your primary care physician will handle  any further medical issues. Please note that NO REFILLS for any discharge medications will be authorized once you are discharged, as it is imperative that you return to your primary care physician (or establish a relationship with a primary care physician if you do not have one) for your aftercare needs so that they can reassess your need for medications and monitor your lab values.     Today   Hypokalemia resolved. Still having some chronic pain.  No other acute events overnight.   VITAL SIGNS:  Blood pressure 129/70, pulse (!) 101, temperature 98.4 F (36.9 C), temperature source Oral, resp. rate 19, height 5\' 5"  (1.651 m), weight 112.6 kg (248 lb 3.2 oz), SpO2 95 %.  I/O:    Intake/Output Summary (Last 24 hours) at 10/19/15 1416 Last data filed at 10/19/15 0800  Gross per 24 hour  Intake              240 ml  Output              800 ml  Net             -560 ml    PHYSICAL EXAMINATION:  GENERAL:  55 y.o.-year-old patient lying in the bed in no acute distress.  EYES: Pupils equal, round, reactive to light and accommodation. No scleral icterus. Extraocular muscles intact.  HEENT: Head atraumatic, normocephalic. Oropharynx  and nasopharynx clear.  NECK:  Supple, no jugular venous distention. No thyroid enlargement, no tenderness.  LUNGS: Normal breath sounds bilaterally, no wheezing, rales,rhonchi. No use of accessory muscles of respiration.  CARDIOVASCULAR: S1, S2 normal. No murmurs, rubs, or gallops.  ABDOMEN: Soft, non-tender, non-distended. Bowel sounds present. No organomegaly or mass.  EXTREMITIES: No pedal edema, cyanosis, or clubbing.  NEUROLOGIC: Cranial nerves II through XII are intact. No focal motor or sensory defecits b/l. Globally weak.  PSYCHIATRIC: The patient is alert and oriented x 3. Good affect.  SKIN: No obvious rash, lesion, or ulcer.   Foley cath in place and draining yellow urine.   DATA REVIEW:   CBC  Recent Labs Lab 10/17/15 0609  WBC 12.1*  HGB 10.3*  HCT 31.5*  PLT 178    Chemistries   Recent Labs Lab 10/15/15 0827  10/17/15 0609  10/19/15 0348  NA 135  < > 136  < > 138  K 2.5*  < > 2.8*  < > 3.4*  CL 95*  < > 97*  < > 101  CO2 27  < > 29  < > 30  GLUCOSE 197*  < > 203*  < > 226*  BUN 10  < > 9  < > 7  CREATININE 0.85  < > 0.88  < > 0.80  CALCIUM 8.3*  < > 7.8*  < > 8.4*  MG  --   < > 2.1  --   --   AST 34  --   --   --   --   ALT 24  --   --   --   --   ALKPHOS 102  --   --   --   --   BILITOT 0.9  --   --   --   --   < > = values in this interval not displayed.  Cardiac Enzymes No results for input(s): TROPONINI in the last 168 hours.  Microbiology Results  Results for orders placed or performed during the hospital encounter of 10/16/15  Urine culture     Status:  Abnormal   Collection Time: 10/16/15  4:37 PM  Result Value Ref Range Status   Specimen Description URINE, CATHETERIZED  Final   Special Requests Normal  Final   Culture >=100,000 COLONIES/mL ENTEROCOCCUS FAECALIS (A)  Final   Report Status 10/19/2015 FINAL  Final   Organism ID, Bacteria ENTEROCOCCUS FAECALIS (A)  Final      Susceptibility   Enterococcus faecalis - MIC*    AMPICILLIN  <=2 SENSITIVE Sensitive     LEVOFLOXACIN 1 SENSITIVE Sensitive     NITROFURANTOIN <=16 SENSITIVE Sensitive     VANCOMYCIN 1 SENSITIVE Sensitive     * >=100,000 COLONIES/mL ENTEROCOCCUS FAECALIS    RADIOLOGY:  No results found.    Management plans discussed with the patient, family and they are in agreement.  CODE STATUS:     Code Status Orders        Start     Ordered   10/16/15 2025  Full code  Continuous     10/16/15 2024    Code Status History    Date Active Date Inactive Code Status Order ID Comments User Context   09/19/2015 12:17 PM 09/21/2015  7:52 PM Full Code [id number]  Lytle Butte, MD ED   09/07/2015 12:37 AM 09/08/2015  8:59 PM Full Code [id number]  Lance Coon, MD Inpatient   08/24/2015  2:10 AM 08/28/2015 12:17 PM Full Code [id number]  Lance Coon, MD Inpatient      TOTAL TIME TAKING CARE OF THIS PATIENT: 40 minutes.    Henreitta Leber M.D on 10/19/2015 at 2:16 PM  Between 7am to 6pm - Pager - [phone]  After 6pm go to [email or web address] - Proofreader  [facility]  Office  [phone]  CC: Primary care physician; Ellamae Sia, MD

## 2015-10-19 NOTE — Progress Notes (Signed)
Inpatient Diabetes Program Recommendations  AACE/ADA: New Consensus Statement on Inpatient Glycemic Control (2015)  Target Ranges:  Prepandial:   less than 140 mg/dL      Peak postprandial:   less than 180 mg/dL (1-2 hours)      Critically ill patients:  140 - 180 mg/dL  Results for TRELLA, Annette [name] (MRN [id number]) as of 10/19/2015 09:59  Ref. Range 10/15/2015 08:27 10/16/2015 17:17 10/17/2015 06:09 10/18/2015 04:34 10/19/2015 03:48  Glucose Latest Ref Range: 65 - 99 mg/dL 197 (H) 230 (H) 203 (H) 237 (H) 226 (H)    Review of Glycemic Control  Diabetes history: DM2 Outpatient Diabetes medications: Metformin XR 500 mg QAM Current orders for Inpatient glycemic control: None  Inpatient Diabetes Program Recommendations: Correction (SSI): Lab glucose has ranged from 197-237 mg/dl since admitted. While inpatient, please consider ordering CBGs with Novolog correction scale ACHS. Diet: Please consider changing diet from Regular to Carb Modified.  Thanks, Barnie Alderman, RN, MSN, CDE Diabetes Coordinator Inpatient Diabetes Program [phone] (Team Pager from [place] to [place]) [phone] ([place] office) [phone] [facility] office) [phone] [facility] office)

## 2015-10-19 NOTE — Progress Notes (Signed)
Report called to [facility].  Patient being discharged and will be transported by EMS.

## 2015-10-19 NOTE — Progress Notes (Signed)
Pt discharged via EMS, foley emptied prior to discharge. Report called by prior RN.
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
## 2015-10-19 NOTE — Clinical Social Work Note (Signed)
Late Entry:  Clinical Social Work Assessment  Patient Details  Name: Annette [name] MRN: [id number] Date of Birth: [birth year]  Date of referral:  10/18/15               Reason for consult:  Discharge Planning                Permission sought to share information with:  Family Supports Permission granted to share information::  Yes, Verbal Permission Granted  Name::        Agency::     Relationship::   ([place]- Daughter)  Contact Information:     Housing/Transportation Living arrangements for the past 2 months:  Single Family Home Source of Information:  Patient, Adult Children Patient Interpreter Needed:  None Criminal Activity/Legal Involvement Pertinent to Current Situation/Hospitalization:  No - Comment as needed Significant Relationships:  Adult Children Lives with:  Self Do you feel safe going back to the place where you live?  Yes Need for family participation in patient care:  Yes (Comment) Phineas Real- Daughter)  Care giving concerns:  PT recommended SNF placement for patient.    Social Worker assessment / plan:  CSW met with patient at bedside. Introduced herself and her role. Per patient she is from home and she lives alone. CSW discussed PT recommendations with patient. Stated that she's been discussing SNF with her daughter Phineas Real. Reported that she thinks she may go but she's unsure becasue she hasn't made up her mind. Stated she has a friend that checks on her but she doesn't know what she wants to do at discharge. Granted CSW verbal permission to speak to her daughter about discharge plans. Granted CSW verbal permission to send SNF referral to SNFs in [place]. FL2/ PASRR completed and faxed to SNFs in [facility].  CSW spoke to patient's daughter over the phone. Explained STR placement. Daughter preference is [facility]. CSW contacted [facility] and requested he reviewed patient referral. CSW will continue to follow and assist.   Employment  status:  Retired Nurse, adult PT Recommendations:  College / Referral to community resources:  [place]  Patient/Family's Response to care:  Patient is somewhat agree able to [facility]. Stated she wanted to think about it. Her family is interested in SNF placement. Preference [facility]  Patient/Family's Understanding of and Emotional Response to Diagnosis, Current Treatment, and Prognosis:  Patient and her family somewhat understands patient's Diagnosis, Current Treatment, and Prognosis. Thanked CSW for her assistance.   Emotional Assessment Appearance:  Appears stated age Attitude/Demeanor/Rapport:   (None) Affect (typically observed):  Blunt, Pleasant Orientation:  Oriented to Self, Oriented to Place, Oriented to  Time, Oriented to Situation Alcohol / Substance use:  Not Applicable Psych involvement (Current and /or in the community):  No (Comment)  Discharge Needs  Concerns to be addressed:  Discharge Planning Concerns Readmission within the last 30 days:  No Current discharge risk:  Chronically ill Barriers to Discharge:  Continued Medical Work up   [employer], LCSW 10/19/2015, 10:00 AM

## 2015-10-19 NOTE — Clinical Social Work Placement (Signed)
   CLINICAL SOCIAL WORK PLACEMENT  NOTE  Date:  10/19/2015  Patient Details  Name: Annette [name] MRN: [id number] Date of Birth: 13-Apr-1960  Clinical Social Work is seeking post-discharge placement for this patient at the [place] level of care (*CSW will initial, date and re-position this form in  chart as items are completed):  Yes   Patient/family provided with [place] Work Department's list of facilities offering this level of care within the geographic area requested by the patient (or if unable, by the patient's family).  Yes   Patient/family informed of their freedom to choose among providers that offer the needed level of care, that participate in Medicare, Medicaid or managed care program needed by the patient, have an available bed and are willing to accept the patient.  Yes   Patient/family informed of [place]'s ownership interest in [facility] and [facility], as well as of the fact that they are under no obligation to receive care at these facilities.  PASRR submitted to EDS on 10/19/15     PASRR number received on 10/19/15     Existing PASRR number confirmed on       FL2 transmitted to all facilities in geographic area requested by pt/family on 10/19/15     FL2 transmitted to all facilities within larger geographic area on       Patient informed that his/her managed care company has contracts with or will negotiate with certain facilities, including the following:        Yes   Patient/family informed of bed offers received.  Patient chooses bed at  ([facility])     Physician recommends and patient chooses bed at      Patient to be transferred to  ([facility]) on 10/19/15.  Patient to be transferred to facility by  (EMS)     Patient family notified on 10/19/15 of transfer.  Name of family member notified:   (Daughter)     PHYSICIAN       Additional Comment:    _______________________________________________ Baldemar Lenis, LCSW 10/19/2015, 3:01 PM

## 2015-10-19 NOTE — Progress Notes (Signed)
CSW presented bed offers to patient's daughter Phineas Real. Accepted bed at [facility]. Informed Joseph- Admissions Coordinator at [facility].  Clinical Social Worker was informed that patient will be medically ready to discharge to [facility]. Patient and daughter Phineas Real are in a agreement with plan. CSW called Broadus John- Admissions Coordinator at [facility] to confirm that patient's bed is ready. Provided patient's room number [id number] and number to call for report Reggie Pile [phone] . All discharge information faxed to [facility] via [facility].   Call to patient's daughter, to inform her patient would discharge to [facility]. RN will call report and patient will discharge to [facility] via EMS  Ernest Pine, MSW, LCSW, [place] Social Worker [phone]

## 2015-10-19 NOTE — Progress Notes (Deleted)
a 

## 2015-10-20 ENCOUNTER — Emergency Department: Payer: Medicare Other

## 2015-10-20 ENCOUNTER — Other Ambulatory Visit: Payer: Self-pay

## 2015-10-20 ENCOUNTER — Emergency Department
Admission: EM | Admit: 2015-10-20 | Discharge: 2015-10-20 | Disposition: A | Discharge status: Home / Self Care | Payer: Medicare Other | Attending: Student | Admitting: Student

## 2015-10-20 ENCOUNTER — Encounter: Payer: Self-pay | Admitting: Emergency Medicine

## 2015-10-20 DIAGNOSIS — Z87891 Personal history of nicotine dependence: Secondary | ICD-10-CM | POA: Diagnosis not present

## 2015-10-20 DIAGNOSIS — Z7984 Long term (current) use of oral hypoglycemic drugs: Secondary | ICD-10-CM | POA: Diagnosis not present

## 2015-10-20 DIAGNOSIS — R339 Retention of urine, unspecified: Secondary | ICD-10-CM | POA: Diagnosis not present

## 2015-10-20 DIAGNOSIS — C55 Malignant neoplasm of uterus, part unspecified: Secondary | ICD-10-CM | POA: Insufficient documentation

## 2015-10-20 DIAGNOSIS — Z906 Acquired absence of other parts of urinary tract: Secondary | ICD-10-CM | POA: Insufficient documentation

## 2015-10-20 DIAGNOSIS — Z7982 Long term (current) use of aspirin: Secondary | ICD-10-CM | POA: Insufficient documentation

## 2015-10-20 DIAGNOSIS — J45909 Unspecified asthma, uncomplicated: Secondary | ICD-10-CM | POA: Diagnosis not present

## 2015-10-20 DIAGNOSIS — E119 Type 2 diabetes mellitus without complications: Secondary | ICD-10-CM | POA: Diagnosis not present

## 2015-10-20 DIAGNOSIS — R109 Unspecified abdominal pain: Secondary | ICD-10-CM | POA: Diagnosis present

## 2015-10-20 DIAGNOSIS — I10 Essential (primary) hypertension: Secondary | ICD-10-CM | POA: Insufficient documentation

## 2015-10-20 DIAGNOSIS — R197 Diarrhea, unspecified: Secondary | ICD-10-CM | POA: Insufficient documentation

## 2015-10-20 DIAGNOSIS — R1084 Generalized abdominal pain: Secondary | ICD-10-CM | POA: Diagnosis not present

## 2015-10-20 LAB — COMPREHENSIVE METABOLIC PANEL
ALBUMIN: 2.9 g/dL — AB (ref 3.5–5.0)
ALT: 26 U/L (ref 14–54)
AST: 36 U/L (ref 15–41)
Alkaline Phosphatase: 107 U/L (ref 38–126)
Anion gap: 10 (ref 5–15)
BUN: 7 mg/dL (ref 6–20)
CHLORIDE: 100 mmol/L — AB (ref 101–111)
CO2: 25 mmol/L (ref 22–32)
CREATININE: 0.71 mg/dL (ref 0.44–1.00)
Calcium: 8.4 mg/dL — ABNORMAL LOW (ref 8.9–10.3)
GFR calc Af Amer: >60 mL/min (ref >60)
GLUCOSE: 207 mg/dL — AB (ref 65–99)
POTASSIUM: 3.5 mmol/L (ref 3.5–5.1)
Sodium: 135 mmol/L (ref 135–145)
Total Bilirubin: 0.5 mg/dL (ref 0.3–1.2)
Total Protein: 7.3 g/dL (ref 6.5–8.1)

## 2015-10-20 LAB — CBC WITH DIFFERENTIAL/PLATELET
BASOS ABS: 0.2 10*3/uL — AB (ref 0–0.1)
Basophils Relative: 2 %
EOS ABS: 0.1 10*3/uL (ref 0–0.7)
Eosinophils Relative: 1 %
HEMATOCRIT: 29.8 % — AB (ref 35.0–47.0)
Hemoglobin: 9.7 g/dL — ABNORMAL LOW (ref 12.0–16.0)
LYMPHS ABS: 2.4 10*3/uL (ref 1.0–3.6)
LYMPHS PCT: 20 %
MCH: 27.4 pg (ref 26.0–34.0)
MCHC: 32.4 g/dL (ref 32.0–36.0)
MCV: 84.6 fL (ref 80.0–100.0)
MONOS PCT: 8 %
Monocytes Absolute: 1 10*3/uL — ABNORMAL HIGH (ref 0.2–0.9)
Neutro Abs: 8.4 10*3/uL — ABNORMAL HIGH (ref 1.4–6.5)
Neutrophils Relative %: 69 %
PLATELETS: 184 10*3/uL (ref 150–440)
RBC: 3.52 MIL/uL — AB (ref 3.80–5.20)
RDW: 14.9 % — ABNORMAL HIGH (ref 11.5–14.5)
WBC: 12.1 10*3/uL — AB (ref 3.6–11.0)

## 2015-10-20 LAB — URINALYSIS COMPLETE WITH MICROSCOPIC (ARMC ONLY)
Bilirubin Urine: NEGATIVE
Glucose, UA: NEGATIVE mg/dL
HGB URINE DIPSTICK: NEGATIVE
Ketones, ur: NEGATIVE mg/dL
LEUKOCYTES UA: NEGATIVE
Nitrite: NEGATIVE
PH: 6 (ref 5.0–8.0)
PROTEIN: 30 mg/dL — AB
Specific Gravity, Urine: 1.011 (ref 1.005–1.030)

## 2015-10-20 LAB — LIPASE, BLOOD: LIPASE: 30 U/L (ref 11–51)

## 2015-10-20 LAB — TROPONIN I

## 2015-10-20 MED ORDER — LISINOPRIL 10 MG PO TABS
10.0000 mg | ORAL_TABLET | Freq: Once | ORAL | Status: AC
  Administered 2015-10-20: 10 mg via ORAL
  Filled 2015-10-20: qty 1

## 2015-10-20 MED ORDER — HYDROMORPHONE HCL 1 MG/ML IJ SOLN
1.0000 mg | Freq: Once | INTRAMUSCULAR | Status: AC
  Administered 2015-10-20: 1 mg via INTRAVENOUS
  Filled 2015-10-20: qty 1

## 2015-10-20 MED ORDER — SODIUM CHLORIDE 0.9 % IV BOLUS (SEPSIS)
500.0000 mL | Freq: Once | INTRAVENOUS | Status: AC
  Administered 2015-10-20: 500 mL via INTRAVENOUS

## 2015-10-20 MED ORDER — BARIUM SULFATE 2.1 % PO SUSP
450.0000 mL | ORAL | Status: AC
  Administered 2015-10-20: 900 mL via ORAL
  Administered 2015-10-20: 450 mL via ORAL

## 2015-10-20 NOTE — ED Notes (Signed)
Pt finished drinking contrast.

## 2015-10-20 NOTE — ED Notes (Signed)
Pt repositioned on right side, given warm blanket.

## 2015-10-20 NOTE — ED Notes (Signed)
Pt up to toilet in room with no difficulty or distress noted.

## 2015-10-20 NOTE — ED Notes (Signed)
Pt accusing this RN of not giving patient medication, pt informed that she got IV dilaudid 5 minutes ago. MD made aware.

## 2015-10-20 NOTE — ED Provider Notes (Addendum)
[facility] Emergency Department Provider Note   ____________________________________________   First MD Initiated Contact with Patient 10/20/15 [phone]     (approximate)  I have reviewed the triage vital signs and the nursing notes.   HISTORY  Chief Complaint Abdominal Pain    HPI Annette [name] is a 54 y.o. female with uterine cancer, Foley placed for outflow obstruction, diabetes, hypertension hyperlipidemia, chronic kidney disease who presents for evaluation of severe abdominal pain which has been ongoing for some time and is thought to be related to her metastatic cancer however severe today, gradual onset, she received her by mouth Dilantin and Xanax with no improvement, pain is been constant. She reports the pain is in the left upper quadrant and epigastrium. No nausea or vomiting but she has had diarrhea. No fevers or chills. No shortness of breath.  She was discharged from [facility] yesterday after treatment for generalized weakness, urinary retention with Foley placement, hypokalemia, hypomagnesemia and QTc was prolonged. It seems she was discharged without a Foley catheter in place.   Past Medical History:  Diagnosis Date  . Asthma   . Back pain   . Bladder absent    unable to urination- in hospital  . Cancer [facility])   . DDD (degenerative disc disease), lumbar   . Depression   . Diabetes mellitus without complication ([place])   . Hyperlipidemia   . Hypertension   . Insomnia   . Menopausal syndrome   . Obesity   . Onychomycosis   . Peripheral neuropathy ([place])   . Renal insufficiency     Patient Active Problem List   Diagnosis Date Noted  . Bladder absent   . Hypokalemia 10/08/2015  . Generalized abdominal pain   . Palliative care encounter   . Chronic pain   . Bilateral hydronephrosis 09/06/2015  . Sore on leg 08/24/2015  . Intractable pain 08/23/2015  . HTN (hypertension) 08/23/2015  . HLD (hyperlipidemia)  08/23/2015  . Cancer of uterine body ([place]) 08/18/2015  . Malignant neoplasm of overlapping sites of body of uterus ([place]) 08/18/2015  . Morbid obesity with BMI of 45.0-49.9, adult ([place]) 08/14/2015  . Uterine mass 07/28/2015  . 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 DDD (degenerative disc disease), lumbar 06/24/2014  . Facet syndrome, lumbar 06/24/2014  . Neuropathy due to secondary diabetes ([place]) 06/24/2014  . DDD (degenerative disc disease), cervical 06/24/2014  . Occipital neuralgia 06/24/2014  . LBP (low back pain) 06/24/2014  . Degeneration of intervertebral disc of cervical region 06/24/2014  . Degeneration of intervertebral disc of lumbar region 06/24/2014  . Diabetes mellitus ([place]) 06/24/2014  . Large breasts 05/30/2012    Past Surgical History:  Procedure Laterality Date  . COSMETIC SURGERY    . JOINT REPLACEMENT Bilateral [id number]  . LIVER BIOPSY    . REDUCTION MAMMAPLASTY Bilateral 12/15/2013  . SHOULDER SURGERY Left     Prior to Admission medications   Medication Sig Start Date End Date Taking? Authorizing Provider  alprazolam Duanne Moron) 2 MG tablet Take 1 tablet (2 mg total) by mouth 2 (two) times daily as needed for anxiety. 10/19/15   Henreitta Leber, MD  aspirin EC 81 MG tablet Take 81 mg by mouth every morning.     Historical Provider, MD  buPROPion (WELLBUTRIN SR) 150 MG 12 hr tablet Take 1 tablet (150 mg total) by mouth daily. 10/15/15   Cammie Sickle, MD  cephALEXin (KEFLEX) 500 MG capsule Take 1 capsule (500  mg total) by mouth 2 (two) times daily. 10/19/15 10/21/15  Henreitta Leber, MD  gabapentin (NEURONTIN) 600 MG tablet Limit 1 tablet by mouth twice per day if tolerated 10/15/15   Cammie Sickle, MD  HYDROmorphone (DILAUDID) 2 MG tablet Take 1 tablet (2 mg total) by mouth every 3 (three) hours as needed for severe pain. 10/19/15   Henreitta Leber, MD  lactulose (CHRONULAC) 10 GM/15ML solution Take 30 g by mouth 2 (two) times daily as needed for mild  constipation or moderate constipation.     Historical Provider, MD  lisinopril (PRINIVIL,ZESTRIL) 10 MG tablet Take 10 mg by mouth every morning.     Historical Provider, MD  megestrol (MEGACE) 40 MG tablet Take 1 tablet (40 mg total) by mouth daily. 10/19/15   Henreitta Leber, MD  metFORMIN (GLUCOPHAGE-XR) 500 MG 24 hr tablet Take 500 mg by mouth daily with breakfast.    Historical Provider, MD  oxybutynin (DITROPAN) 5 MG tablet Take 1 tablet (5 mg total) by mouth every 8 (eight) hours as needed for bladder spasms. 10/14/15   Hollice Espy, MD  polyethylene glycol [facility] Floria Raveling) packet Take 17 g by mouth 2 (two) times daily. Patient taking differently: Take 17 g by mouth 2 (two) times daily as needed for mild constipation or moderate constipation.  09/21/15   Fritzi Mandes, MD  Potassium Chloride ER 20 MEQ TBCR Take 1 tablet by mouth 2 (two) times daily. 10/15/15   Cammie Sickle, MD  pravastatin (PRAVACHOL) 40 MG tablet Take 40 mg by mouth at bedtime.     Historical Provider, MD  senna-docusate (SENOKOT-S) 8.6-50 MG tablet Take 2 tablets by mouth daily. 10/15/15   Cammie Sickle, MD  silver sulfADIAZINE (SILVADENE) 1 % cream Apply topically daily. Patient taking differently: Apply topically every other day.  10/15/15   Cammie Sickle, MD  Tapentadol HCl (NUCYNTA ER) 200 MG TB12 Take 1 tablet by mouth every 12 (twelve) hours. 10/19/15   Henreitta Leber, MD  tiZANidine (ZANAFLEX) 2 MG tablet Limit 1 tablet by mouth 2-4 times per day if tolerated 09/02/15   Mohammed Kindle, MD  traZODone (DESYREL) 150 MG tablet Take 300 mg by mouth at bedtime as needed for sleep.     Historical Provider, MD  Vilazodone HCl (VIIBRYD) 20 MG TABS Take 1 tablet (20 mg total) by mouth daily. Takes with 40mg  for total of 60mg  daily 10/15/15   Cammie Sickle, MD  Vilazodone HCl (VIIBRYD) 40 MG TABS Take 1 tablet (40 mg total) by mouth daily. Takes with 20mg  for total of 60mg  daily 10/15/15   Cammie Sickle,  MD    Allergies Iodides; Iodine; Shellfish allergy; Shellfish-derived products; Iodinated diagnostic agents; Povidone iodine; and Povidone-iodine  Family History  Problem Relation Age of Onset  . COPD Mother   . Sleep apnea Mother   . Alcohol abuse Mother   . Depression Mother   . Stroke Mother   . Heart disease Father   . Heart disease Sister   . Bladder Cancer Neg Hx   . Kidney cancer Neg Hx     Social History Social History  Substance Use Topics  . Smoking status: Former Smoker    Types: Cigarettes    Quit date: 08/19/2015  . Smokeless tobacco: Never Used  . Alcohol use No    Review of Systems Constitutional: No fever/chills Eyes: No visual changes. ENT: No sore throat. Cardiovascular: Denies chest pain. Respiratory: Denies shortness of  breath. Gastrointestinal: + abdominal pain.  No nausea, no vomiting.  + diarrhea.  No constipation. Genitourinary: Negative for dysuria. Musculoskeletal: Negative for back pain. Skin: Negative for rash. Neurological: Negative for headaches, focal weakness or numbness.  10-point ROS otherwise negative.  ____________________________________________   PHYSICAL EXAM:  Vitals:   10/20/15 1154 10/20/15 1200 10/20/15 1204 10/20/15 1351  BP: 133/73 130/86 130/86 133/73  Pulse: (!) 105  95 92  Resp: 17 14 18 17   Temp:      TempSrc:      SpO2: 97%  98% 99%  Weight:      Height:        Vitals:   10/20/15 1154 10/20/15 1200 10/20/15 1204 10/20/15 1351  BP: 133/73 130/86 130/86 133/73  Pulse: (!) 105  95 92  Resp: 17 14 18 17   Temp:      TempSrc:      SpO2: 97%  98% 99%  Weight:      Height:         Constitutional: Alert and oriented. Appears to be in distress secondary to pain, unable to find a comfortable position in bed. Eyes: Conjunctivae are normal. PERRL. EOMI. Head: Atraumatic. Nose: No congestion/rhinnorhea. Mouth/Throat: Mucous membranes are moist.  Oropharynx non-erythematous. Neck: No stridor.  Supple  without meningismus. Cardiovascular: Normal rate, regular rhythm. Grossly normal heart sounds.  Good peripheral circulation. Respiratory: Normal respiratory effort.  No retractions. Lungs CTAB. Gastrointestinal: Normal bowel sounds, diffusely tender to palpation. No CVA tenderness. Genitourinary: Deferred Musculoskeletal: No lower extremity tenderness nor edema.  No joint effusions. Neurologic:  Normal speech and language. No gross focal neurologic deficits are appreciated.  Skin:  Skin is warm, dry and intact. No rash noted. Psychiatric: Mood and affect are normal. Speech and behavior are normal.  ____________________________________________   LABS (all labs ordered are listed, but only abnormal results are displayed)  Labs Reviewed  CBC WITH DIFFERENTIAL/PLATELET - Abnormal; Notable for the following:       Result Value   WBC 12.1 (*)    RBC 3.52 (*)    Hemoglobin 9.7 (*)    HCT 29.8 (*)    RDW 14.9 (*)    Neutro Abs 8.4 (*)    Monocytes Absolute 1.0 (*)    Basophils Absolute 0.2 (*)    All other components within normal limits  COMPREHENSIVE METABOLIC PANEL - Abnormal; Notable for the following:    Chloride 100 (*)    Glucose, Bld 207 (*)    Calcium 8.4 (*)    Albumin 2.9 (*)    All other components within normal limits  URINALYSIS COMPLETEWITH MICROSCOPIC ([facility] ONLY) - Abnormal; Notable for the following:    Color, Urine YELLOW (*)    APPearance CLEAR (*)    Protein, ur 30 (*)    Bacteria, UA RARE (*)    Squamous Epithelial / LPF 0-5 (*)    All other components within normal limits  LIPASE, BLOOD  TROPONIN I   ____________________________________________  EKG  ED ECG REPORT I, Joanne Gavel, the attending physician, personally viewed and interpreted this ECG.   Date: 10/20/2015  EKG Time: 07:22  Rate: 98  Rhythm: normal sinus rhythm  Axis: normal  Intervals:none  ST&T Change: No acute ST elevation or acute ST depression. T-wave inversion in V3 but  otherwise EKG is unchanged from prior. QTc is normal at 498 ms.  ____________________________________________  RADIOLOGY  CT abdomen and pelvis IMPRESSION:  1. Interval increase in size and number of pulmonary  metastases.  2. Interval increase in size and number of multiple liver metastases  diffusely.  3. Bilateral hydronephrosis appears to be due to compression of the  ureters by the larger lobular pelvic mass described previously.  4. Large lobular pelvic mass some of which is calcified and most  consistent with multi fibroid uterus. However on this unenhanced  study, malignancy cannot be excluded.     ____________________________________________   PROCEDURES  Procedure(s) performed: None  Procedures  Critical Care performed: No  ____________________________________________   INITIAL IMPRESSION / ASSESSMENT AND PLAN / ED COURSE  Pertinent labs & imaging results that were available during my care of the patient were reviewed by me and considered in my medical decision making (see chart for details).  Annette [name] is a 55 y.o. female with uterine cancer, Foley placed for outflow obstruction, diabetes, hypertension hyperlipidemia, chronic kidney disease who presents for evaluation of severe abdominal pain which has been ongoing for some time and is thought to be related to her metastatic cancer however severe today. On exam, she is in distress due to pain. Her vital signs are stable she is afebrile. She does have diffuse abdominal tenderness, normal bowel sounds. Mild hypertension, likely pain related but the remainder of her vital signs are stable she is afebrile. We'll obtain screening labs, bladder scan to evaluate for recurrent urinary retention, treat her symptomatically. Reassess for disposition and need for advanced imaging.   ----------------------------------------- 11:27 AM on 10/20/2015 ----------------------------------------- Patient is resting comfortably in  bed. She does have hypertension with blood pressure 168/102 but she has not taken her lisinopril today, I have ordered this. I reviewed her labs which show chronic lab abnormalities including chronic mild leukocytosis as well as chronic anemia with hemoglobin of 9.7 today, only slightly decreased from prior. CMP is generally unremarkable. Urinalysis is not consistent with infection. Normal lipase, negative troponin. CT of the abdomen and pelvis shows generally worsening metastatic disease when compared to her CT scan obtained last month. She has chronic hydronephrosis, unchanged from the prior CT. She did have acute urinary retention again here in the emergency department and will be going home with a Foley catheter in place and follow up with urology for trial void. I discussed discharge with the patient as well as her caregivers and family at bedside and they are comfortable with the discharge plan. DC home. She has palliative care and they will be following up with her.  ----------------------------------------- 1:57 PM on 10/20/2015 ----------------------------------------- Just prior to [facility], patient pulled her Foley catheter and is refusing to allow [place] to replace it. She has capacity to refuse treatment.  Clinical Course     ____________________________________________   FINAL CLINICAL IMPRESSION(S) / ED DIAGNOSES  Final diagnoses:  Generalized abdominal pain  Urinary retention      NEW MEDICATIONS STARTED DURING THIS VISIT:  New Prescriptions   No medications on file     Note:  This document was prepared using Dragon voice recognition software and may include unintentional dictation errors.    Joanne Gavel, MD 10/20/15 1131    Joanne Gavel, MD 10/20/15 1357

## 2015-10-20 NOTE — ED Notes (Signed)
MD back to bedside for re-evaluation.

## 2015-10-20 NOTE — ED Triage Notes (Signed)
Pt D/C last night, sent to [facility] for [facility]. Pt recently dx with stomach cancer, reports abdominal pain today and the last 4 months. Pt denies any recent medication, facility reports they gave dilaudid and xanax around 0500 this morning.

## 2015-10-22 ENCOUNTER — Other Ambulatory Visit: Payer: Self-pay

## 2015-10-22 ENCOUNTER — Inpatient Hospital Stay: Payer: Medicare Other

## 2015-10-22 VITALS — BP 143/82 | HR 97 | Temp 97.0°F | Resp 18

## 2015-10-22 DIAGNOSIS — E876 Hypokalemia: Secondary | ICD-10-CM

## 2015-10-22 DIAGNOSIS — Z5111 Encounter for antineoplastic chemotherapy: Secondary | ICD-10-CM | POA: Diagnosis not present

## 2015-10-22 DIAGNOSIS — C548 Malignant neoplasm of overlapping sites of corpus uteri: Secondary | ICD-10-CM

## 2015-10-22 LAB — COMPREHENSIVE METABOLIC PANEL
ALK PHOS: 104 U/L (ref 38–126)
ALT: 23 U/L (ref 14–54)
ANION GAP: 7 (ref 5–15)
AST: 24 U/L (ref 15–41)
Albumin: 3 g/dL — ABNORMAL LOW (ref 3.5–5.0)
BILIRUBIN TOTAL: 0.4 mg/dL (ref 0.3–1.2)
BUN: 7 mg/dL (ref 6–20)
CALCIUM: 8.6 mg/dL — AB (ref 8.9–10.3)
CO2: 28 mmol/L (ref 22–32)
Chloride: 102 mmol/L (ref 101–111)
Creatinine, Ser: 0.77 mg/dL (ref 0.44–1.00)
GFR calc non Af Amer: >60 mL/min (ref >60)
Glucose, Bld: 186 mg/dL — ABNORMAL HIGH (ref 65–99)
POTASSIUM: 3.3 mmol/L — AB (ref 3.5–5.1)
SODIUM: 137 mmol/L (ref 135–145)
TOTAL PROTEIN: 7.4 g/dL (ref 6.5–8.1)

## 2015-10-22 LAB — CBC WITH DIFFERENTIAL/PLATELET
BASOS PCT: 1 %
Basophils Absolute: 0.1 10*3/uL (ref 0–0.1)
EOS ABS: 0.1 10*3/uL (ref 0–0.7)
Eosinophils Relative: 1 %
HCT: 31.7 % — ABNORMAL LOW (ref 35.0–47.0)
HEMOGLOBIN: 10.4 g/dL — AB (ref 12.0–16.0)
LYMPHS ABS: 2.2 10*3/uL (ref 1.0–3.6)
Lymphocytes Relative: 20 %
MCH: 27.2 pg (ref 26.0–34.0)
MCHC: 32.8 g/dL (ref 32.0–36.0)
MCV: 82.8 fL (ref 80.0–100.0)
MONO ABS: 1.2 10*3/uL — AB (ref 0.2–0.9)
MONOS PCT: 11 %
NEUTROS PCT: 67 %
Neutro Abs: 7.5 10*3/uL — ABNORMAL HIGH (ref 1.4–6.5)
Platelets: 309 10*3/uL (ref 150–440)
RBC: 3.83 MIL/uL (ref 3.80–5.20)
RDW: 15.1 % — AB (ref 11.5–14.5)
WBC: 11 10*3/uL (ref 3.6–11.0)

## 2015-10-22 MED ORDER — PROCHLORPERAZINE MALEATE 10 MG PO TABS
10.0000 mg | ORAL_TABLET | Freq: Once | ORAL | Status: AC
  Administered 2015-10-22: 10 mg via ORAL
  Filled 2015-10-22: qty 1

## 2015-10-22 MED ORDER — SODIUM CHLORIDE 0.9 % IV SOLN
Freq: Once | INTRAVENOUS | Status: AC
  Administered 2015-10-22: 09:00:00 via INTRAVENOUS
  Filled 2015-10-22: qty 1000

## 2015-10-22 MED ORDER — SODIUM CHLORIDE 0.9 % IV SOLN
25.0000 mg | Freq: Once | INTRAVENOUS | Status: AC
  Administered 2015-10-22: 25 mg via INTRAVENOUS
  Filled 2015-10-22: qty 2.5

## 2015-10-22 MED ORDER — DIPHENHYDRAMINE HCL 50 MG/ML IJ SOLN
12.5000 mg | Freq: Once | INTRAMUSCULAR | Status: AC
  Administered 2015-10-22: 12.5 mg via INTRAVENOUS
  Filled 2015-10-22: qty 1

## 2015-10-28 ENCOUNTER — Ambulatory Visit: Payer: Medicare Other

## 2015-10-29 ENCOUNTER — Inpatient Hospital Stay: Payer: Medicare Other

## 2015-10-29 ENCOUNTER — Ambulatory Visit: Payer: Medicare Other | Admitting: Family Medicine

## 2015-10-29 ENCOUNTER — Inpatient Hospital Stay: Payer: Medicare Other | Admitting: Internal Medicine

## 2015-10-29 DIAGNOSIS — R339 Retention of urine, unspecified: Secondary | ICD-10-CM

## 2015-10-29 NOTE — Progress Notes (Signed)
Cath Change/ Replacement  Patient is present today for a catheter change due to urinary retention.  Patient was cleaned and prepped in a sterile fashion with betadine. A 16 FR foley cath was replaced into the bladder no complications were noted Urine return was noted 517ml and urine was yellow in color. The balloon was filled with 6ml of sterile water. A leg bag was attached for drainage.  A night bag was also given to the patient and patient was given instruction on how to change from one bag to another. Patient was given proper instruction on catheter care.    Preformed by: Elberta Leatherwood, CMA  Follow up: 1 month for Cath change

## 2015-11-01 ENCOUNTER — Telehealth: Payer: Self-pay | Admitting: *Deleted

## 2015-11-01 ENCOUNTER — Encounter: Payer: Self-pay | Admitting: Urology

## 2015-11-01 ENCOUNTER — Telehealth: Payer: Self-pay | Admitting: Urology

## 2015-11-01 NOTE — Telephone Encounter (Signed)
Spoke with Maudie Mercury at [facility] in reference to pt pulling catheter out. Made Kim aware Dr. Erlene Quan wants pt in/out cath 4x daily or a catheter replaced with 15cc balloon and secured x2. Maudie Mercury stated they could perform CIC x4 daily. Reinforced with Maudie Mercury should this not work to call back. Kim voiced understanding.

## 2015-11-01 NOTE — Progress Notes (Signed)
10/14/2015 8:10 AM   Cloyd Stagers 1960-12-10 [id number]  Referring provider: Ellamae Sia, MD No address on file  Chief Complaint  Patient presents with  . Hydronephrosis    New patient    HPI:  55 year old female with metastatic stage IV uterus-PECOMA who presents today for urologic evaluation.  She has bilateral hydronephrosis secondary to a large obstructing pelvic mass. Both for comfort and for urinary retention, Foley catheter was placed during a recent emergency room visit on 09/25/2015 after discussing the case with Dr. Louis Meckel.  She returns today to the office today to discuss her bilateral hydronephrosis as well as bladder management.  Today, the patient appears to be extremely uncomfortable, is nauseated, writhing, and taking frequent trips to the bathroom for sensation of needed to have bowel movements. She is accompanied by a caregiver today who is a family friend. Her ability to provide additional history is somewhat limited given her overall clinical status and discomfort.  Her renal function is preserved.  Case was discussed today with Dr. Rogue Bussing her oncologist.  She has extensive metastatic disease and her overall prognosis is poor.  Given that this pathology is extremely rare, it is unclear the rate of progression and its response to chemotherapeutic agents. As such, her overall life expectancy is unknown.  PMH: Past Medical History:  Diagnosis Date  . Asthma   . Back pain   . Bladder absent    unable to urination- in hospital  . Cancer [facility])   . DDD (degenerative disc disease), lumbar   . Depression   . Diabetes mellitus without complication ([place])   . Hyperlipidemia   . Hypertension   . Insomnia   . Menopausal syndrome   . Obesity   . Onychomycosis   . Peripheral neuropathy ([place])   . Renal insufficiency     Surgical History: Past Surgical History:  Procedure Laterality Date  . COSMETIC SURGERY    . JOINT REPLACEMENT Bilateral [id number]    . LIVER BIOPSY    . REDUCTION MAMMAPLASTY Bilateral 12/15/2013  . SHOULDER SURGERY Left     Home Medications:    Medication List       Accurate as of 10/14/15 11:59 PM. Always use your most recent med list.          alprazolam 2 MG tablet Commonly known as:  XANAX Take 2 mg by mouth 2 (two) times daily as needed for anxiety.   aspirin EC 81 MG tablet Take 81 mg by mouth every morning.   buPROPion 150 MG 12 hr tablet Commonly known as:  WELLBUTRIN SR Take 150 mg by mouth daily.   gabapentin 600 MG tablet Commonly known as:  NEURONTIN Limit 1 tablet by mouth twice per day if tolerated   hydrochlorothiazide 25 MG tablet Commonly known as:  HYDRODIURIL Take 25 mg by mouth every morning.   HYDROmorphone 2 MG tablet Commonly known as:  DILAUDID Take 1 tablet (2 mg total) by mouth every 3 (three) hours as needed for severe pain.   lactulose 10 GM/15ML solution Commonly known as:  CHRONULAC Take 30 g by mouth 2 (two) times daily as needed for mild constipation or moderate constipation.   lisinopril 10 MG tablet Commonly known as:  PRINIVIL,ZESTRIL Take 10 mg by mouth every morning.   metFORMIN 500 MG 24 hr tablet Commonly known as:  GLUCOPHAGE-XR Take 500 mg by mouth daily with breakfast.   oxybutynin 5 MG tablet Commonly known as:  DITROPAN Take 1 tablet (5  mg total) by mouth every 8 (eight) hours as needed for bladder spasms.   Oxycodone HCl 10 MG Tabs Take 1 -2 tablets every 3 - 4 hours as needed for pain   polyethylene glycol packet Commonly known as:  MIRALAX / GLYCOLAX Take 17 g by mouth 2 (two) times daily.   Potassium Chloride ER 20 MEQ Tbcr Take 20 mEq by mouth daily.   potassium chloride SA 20 MEQ tablet Commonly known as:  K-DUR,KLOR-CON 1 pill twice a day   pravastatin 40 MG tablet Commonly known as:  PRAVACHOL Take 40 mg by mouth at bedtime.   senna-docusate 8.6-50 MG tablet Commonly known as:  Senokot-S Take 2 tablets by mouth daily.    silver sulfADIAZINE 1 % cream Commonly known as:  SILVADENE Apply topically daily.   Tapentadol HCl 200 MG Tb12 Commonly known as:  NUCYNTA ER Take 1 tablet by mouth every 12 (twelve) hours.   tiZANidine 2 MG tablet Commonly known as:  ZANAFLEX Limit 1 tablet by mouth 2-4 times per day if tolerated   traZODone 150 MG tablet Commonly known as:  DESYREL Take 300 mg by mouth at bedtime as needed for sleep.   VIIBRYD 40 MG Tabs Generic drug:  Vilazodone HCl Take 40 mg by mouth daily. Takes with '20mg'$  for total of '60mg'$  daily   VIIBRYD 20 MG Tabs Generic drug:  Vilazodone HCl Take 20 mg by mouth daily. Takes with '40mg'$  for total of '60mg'$  daily       Allergies:  Allergies  Allergen Reactions  . Iodides Swelling  . Iodine Swelling  . Shellfish Allergy Swelling    shrimp causes swelling  . Shellfish-Derived Products Swelling  . Iodinated Diagnostic Agents Swelling  . Povidone Iodine Swelling    eyes  . Povidone-Iodine Swelling    Family History: Family History  Problem Relation Age of Onset  . COPD Mother   . Sleep apnea Mother   . Alcohol abuse Mother   . Depression Mother   . Stroke Mother   . Heart disease Father   . Heart disease Sister   . Bladder Cancer Neg Hx   . Kidney cancer Neg Hx     Social History:  reports that she quit smoking about 2 months ago. Her smoking use included Cigarettes. She has never used smokeless tobacco. She reports that she does not drink alcohol or use drugs.  ROS: UROLOGY Frequent Urination?: Yes Hard to postpone urination?: Yes Burning/pain with urination?: Yes Get up at night to urinate?: No Leakage of urine?: No Urine stream starts and stops?: No Trouble starting stream?: Yes Do you have to strain to urinate?: Yes Blood in urine?: No Urinary tract infection?: Yes Sexually transmitted disease?: No Injury to kidneys or bladder?: No Painful intercourse?: No Weak stream?: No Currently pregnant?: No Vaginal bleeding?:  No Last menstrual period?: n  Gastrointestinal Nausea?: No Vomiting?: No Indigestion/heartburn?: No Diarrhea?: Yes Constipation?: Yes  Constitutional Fever: No Night sweats?: No Weight loss?: No Fatigue?: No  Skin Skin rash/lesions?: No Itching?: No  Eyes Blurred vision?: No Double vision?: No  Ears/Nose/Throat Sore throat?: No Sinus problems?: No  Hematologic/Lymphatic Swollen glands?: No Easy bruising?: No  Cardiovascular Leg swelling?: No Chest pain?: No  Respiratory Cough?: No Shortness of breath?: No  Endocrine Excessive thirst?: No  Musculoskeletal Back pain?: Yes Joint pain?: Yes  Neurological Headaches?: No Dizziness?: No  Psychologic Depression?: Yes Anxiety?: Yes  Physical Exam: BP 123/89   Pulse (!) 108   Ht 5'  5" (1.651 m)   LMP  (Approximate) Comment: 2007  Constitutional: Extremely uncomfortable, unable to sit in a chair and have a conversation. Lying on the exam table writing.   HEENT:  AT, moist mucus membranes.  Trachea midline, no masses. Cardiovascular: No clubbing, cyanosis, or edema. Respiratory: Normal respiratory effort, no increased work of breathing. GI: Abdomen is soft, nontender, nondistended, no abdominal masses GU: Foley in place draining clear yellow urine.   Skin: No rashes, bruises or suspicious lesions. Neurologic: Grossly intact, no focal deficits, moving all 4 extremities. Psychiatric: Unable to assess given the degree of discomfort  Laboratory Data: Component     Latest Ref Rng & Units 10/15/2015          Glucose     65 - 99 mg/dL 197 (H)  BUN     6 - 20 mg/dL 10  Creatinine     0.44 - 1.00 mg/dL 0.85  Sodium     135 - 145 mmol/L 135  Potassium     3.5 - 5.1 mmol/L 2.5 (LL)  Chloride     101 - 111 mmol/L 95 (L)  CO2     22 - 32 mmol/L 27  Calcium     8.9 - 10.3 mg/dL 8.3 (L)  Anion gap     5 - 15 13  EGFR (African American)     >60 mL/min >60  EGFR (Non-African Amer.)     >60 mL/min >60     Pertinent Imaging: CLINICAL DATA:  Acute onset of urinary retention. Patient on chemotherapy for uterine cancer. Initial encounter.  EXAM: CT ABDOMEN AND PELVIS WITHOUT CONTRAST  TECHNIQUE: Multidetector CT imaging of the abdomen and pelvis was performed following the standard protocol without IV contrast.  COMPARISON:  CT of the abdomen and pelvis performed 09/19/2015  FINDINGS: Numerous nodules of varying size are noted throughout the lung bases, compatible with metastatic disease. Trace pericardial fluid remains within normal limits.  Numerous prominent masses are seen throughout the liver, compatible with metastatic disease. The spleen is unremarkable in appearance. There is suggestion of a 5 mm stone at the distal common bile duct, without evidence of dilatation of the common bile duct. The pancreas is grossly unremarkable in appearance.  The gallbladder is within normal limits. The adrenal glands are unremarkable.  Moderate bilateral hydronephrosis is again noted, with diffuse dilatation of both ureters. This appears to reflect obstruction due to the large pelvic mass. No perinephric stranding is seen. No nonobstructing renal stones are identified.  No free fluid is identified. The small bowel is unremarkable in appearance. The stomach is within normal limits. No acute vascular abnormalities are seen.  The appendix is normal in caliber, without evidence of appendicitis. The proximal colon is grossly unremarkable in appearance, though the sigmoid colon is not well assessed due to surrounding mass. There appears to be diffuse infiltration of the rectum from the large pelvic mass.  The bladder is is not well characterized due to the overlying mass.  The uterus is diffusely and irregularly enlarged, measuring 16.7 x 15.6 x 10.0 cm, reflecting an underlying uterine mass. Associated soft tissue inflammation and trace free fluid is seen. No  inguinal lymphadenopathy is seen.  No acute osseous abnormalities are identified. Bilateral hip arthroplasties are grossly unremarkable in appearance, though incompletely imaged.  IMPRESSION: 1. Diffuse irregular enlargement of the uterus, measuring 16.7 x 15.6 x 10.0 cm, similar in appearance to the recent prior study and reflecting underlying uterine mass. Associated soft tissue inflammation  and trace free fluid noted, mildly more prominent than on the prior study. 2. Apparent diffuse infiltration of the rectum from the large pelvic mass; the sigmoid colon is not well assessed due to surrounding mass. 3. Moderate bilateral hydronephrosis, with diffuse dilatation of both ureters. This appears to reflect obstruction due to the large pelvic mass. 4. Numerous prominent masses throughout the liver, compatible with metastatic disease. 5. Numerous nodules of varying size throughout the lung bases, reflecting metastatic disease. 6. Suggestion of a 5 mm stone at the distal common bile duct, without evidence of dilatation of the common bile duct.   Electronically Signed   By: Garald Balding M.D.   On: 09/26/2015 01:29  CT scan personally reviewed today with the patient.  Assessment & Plan:    1. Nausea Secondary to underlying disease process. Given her degree of discomfort today, we did go ahead and Mr. Nada Libman in the office. - promethazine (PHENERGAN) injection 25 mg; Inject 1 mL (25 mg total) into the muscle once.  2. Urinary retention Urinary retention, likely multifactorial.  Given her overall clinical picture and problems, I do not suspect that this retention will be reversible.  Additionally, she is not a good candidate for self catheter. As such, I would recommend leaving the Foley catheter in place for comfort with Q4 weeks catheter changes.  3. Bilateral hydronephrosis Secondary to obstructive nephropathy from large pelvic tumor burden. At this point, her renal  function is preserved. As such, I would not recommend any stenting or percutaneous nephrostomy tube placement. As her renal function begins to decline, would recommend nephrostomy tube placement if the patient desires prolongation of her life. Given her very poor prognosis, would have a lengthy end-of-life discussion prior to tube placement.  Mrs. Kossman is followed very closely with oncology with serial labs. I would recommend that if her renal function begins to decline, they contact our office ASAP to help facilitate renal drainage as deemed appropriate.  This was communicated directly to her oncologist.  4. Bladder spasm Multiple frequent trips to the bathroom presumably for sensation of needing to have a bowel movement. I suspect this may be related to bladder spasms.  Will prescribe Ditropan as needed for comfort.   Return in about 2 weeks (around 10/28/2015) for nursing cath change.  Hollice Espy, MD  [facility] [address] [place], [place] [address] [phone]

## 2015-11-01 NOTE — Telephone Encounter (Signed)
Annette [name] contacted cancer ctr requesting a call back from the cancer center to discuss her mother's care.

## 2015-11-01 NOTE — Telephone Encounter (Signed)
If they have ability, they need to catheterize or 4 times a day. She does not empty her bladder when she came into the office, she had greater than 500 cc.  If they are unwilling or unable to catheterize her, then try placing 15 cc in the balloon and securing it 2.  Hollice Espy, MD

## 2015-11-01 NOTE — Telephone Encounter (Signed)
Kim from the home that the patient is living at called today and said that the patient keeps pulling her catheter out. They will put it back in and she will pull it back out. So they are unable to keep one in her. They said she is acting irrational and is wondering into other patients rooms. Throwing water on the floor and just acting out.   She wanted you to be aware that they can not keep a catheter in her. If you wanted to speak with her she can be reached at [phone]   Thanks,  [employer]

## 2015-11-02 NOTE — Telephone Encounter (Signed)
Calling again wanting to discuss treatments and Pt has no further appts with [place] at this time. Any appts scheduled need to be notifying [place] [phone] and [facility] also who will be transporting her. Please call her

## 2015-11-02 NOTE — Telephone Encounter (Signed)
md states that patient needs to have her appts r/s lab/md/torisel asap. Missed her apt last Friday.  Per md- Daughter must be present @ apt to discuss plan of care.  Msg sent to cancer ctr scheduling to r/s this apt and to contact daughter with appointments.

## 2015-11-03 ENCOUNTER — Telehealth: Payer: Self-pay | Admitting: Internal Medicine

## 2015-11-03 NOTE — Telephone Encounter (Signed)
Please see phone note from 9/25, this has been discussed already, there should be an order for scheduling to get her appt and to be sure daughter knows she has to be present at her appt.  Thank you

## 2015-11-03 NOTE — Telephone Encounter (Signed)
She spoke w/mom after mom missed appt. She said her mom was delirious from medication and did not know where she was (at [employer]) and why she was there. Patient very confused about her current situation. Daughter very concerned about this because her mom missed her appointment due to her confusion and not knowing why she needed it and [facility] did not (can not) make her keep her appointments. Please contact daughter Phineas Real to discuss where to go from here as far as making up appointments missed and how to address situation to ensure her mom is well-informed and that [facility] is getting her to appointments. Please call either cell [phone]) or work [phone]).

## 2015-11-04 ENCOUNTER — Telehealth: Payer: Self-pay | Admitting: *Deleted

## 2015-11-04 ENCOUNTER — Other Ambulatory Visit
Admission: RE | Admit: 2015-11-04 | Discharge: 2015-11-04 | Disposition: A | Discharge status: Home / Self Care | Payer: Medicare Other | Source: Ambulatory Visit | Attending: Family Medicine | Admitting: Family Medicine

## 2015-11-04 DIAGNOSIS — R4182 Altered mental status, unspecified: Secondary | ICD-10-CM | POA: Insufficient documentation

## 2015-11-04 LAB — CBC WITH DIFFERENTIAL/PLATELET
BASOS PCT: 1 %
Basophils Absolute: 0.1 10*3/uL (ref 0–0.1)
Eosinophils Absolute: 0.1 10*3/uL (ref 0–0.7)
Eosinophils Relative: 1 %
HEMATOCRIT: 29.1 % — AB (ref 35.0–47.0)
HEMOGLOBIN: 9.4 g/dL — AB (ref 12.0–16.0)
Lymphocytes Relative: 21 %
Lymphs Abs: 2.3 10*3/uL (ref 1.0–3.6)
MCH: 26.4 pg (ref 26.0–34.0)
MCHC: 32.2 g/dL (ref 32.0–36.0)
MCV: 82 fL (ref 80.0–100.0)
Monocytes Absolute: 1.1 10*3/uL — ABNORMAL HIGH (ref 0.2–0.9)
Monocytes Relative: 10 %
NEUTROS ABS: 7.6 10*3/uL — AB (ref 1.4–6.5)
NEUTROS PCT: 67 %
Platelets: 230 10*3/uL (ref 150–440)
RBC: 3.54 MIL/uL — ABNORMAL LOW (ref 3.80–5.20)
RDW: 16.5 % — AB (ref 11.5–14.5)
WBC: 11.1 10*3/uL — ABNORMAL HIGH (ref 3.6–11.0)

## 2015-11-04 LAB — COMPREHENSIVE METABOLIC PANEL
ALBUMIN: 2.5 g/dL — AB (ref 3.5–5.0)
ALK PHOS: 155 U/L — AB (ref 38–126)
ALT: 22 U/L (ref 14–54)
AST: 35 U/L (ref 15–41)
Anion gap: 9 (ref 5–15)
BILIRUBIN TOTAL: 0.3 mg/dL (ref 0.3–1.2)
BUN: 7 mg/dL (ref 6–20)
CO2: 24 mmol/L (ref 22–32)
Calcium: 8.4 mg/dL — ABNORMAL LOW (ref 8.9–10.3)
Chloride: 107 mmol/L (ref 101–111)
Creatinine, Ser: 0.87 mg/dL (ref 0.44–1.00)
GFR calc Af Amer: >60 mL/min (ref >60)
GFR calc non Af Amer: >60 mL/min (ref >60)
Glucose, Bld: 167 mg/dL — ABNORMAL HIGH (ref 65–99)
POTASSIUM: 3.5 mmol/L (ref 3.5–5.1)
Sodium: 140 mmol/L (ref 135–145)
TOTAL PROTEIN: 6.9 g/dL (ref 6.5–8.1)

## 2015-11-04 NOTE — Telephone Encounter (Signed)
Cristin Gusler, NP - Palliative Care called to state Annette [name] is not doing well, she is very lethargic and moans when abd is palpated. In her opinion, the patient is hospice home appropriate right now. She reports that the patient will have to come to appt tomorrow by EMS ans will need to be transferred to a stretcher or bed when she arrives. Would like for Dr B to evaluate and discuss with daughter if Hospice is appropriate.

## 2015-11-04 NOTE — Telephone Encounter (Signed)
Dr. B left Dr. Jacinto Reap left msg for Annette [name] to call him personally back on his cell phone ASAP to discuss pt's care.  MD feels that this is hospice home appropriate.    MD reached out to pt's daughter- Phineas Real.  Daughter insists keeping appointment tomorrow to discuss plan of care.  Patient at [facility] at the present time.

## 2015-11-05 ENCOUNTER — Inpatient Hospital Stay (HOSPITAL_BASED_OUTPATIENT_CLINIC_OR_DEPARTMENT_OTHER): Payer: Medicare Other | Admitting: Internal Medicine

## 2015-11-05 ENCOUNTER — Inpatient Hospital Stay: Payer: Medicare Other

## 2015-11-05 VITALS — BP 135/87 | HR 109 | Temp 97.3°F | Resp 21 | Ht 65.0 in | Wt 247.3 lb

## 2015-11-05 DIAGNOSIS — K5900 Constipation, unspecified: Secondary | ICD-10-CM

## 2015-11-05 DIAGNOSIS — E876 Hypokalemia: Secondary | ICD-10-CM

## 2015-11-05 DIAGNOSIS — E785 Hyperlipidemia, unspecified: Secondary | ICD-10-CM

## 2015-11-05 DIAGNOSIS — C7800 Secondary malignant neoplasm of unspecified lung: Secondary | ICD-10-CM | POA: Diagnosis not present

## 2015-11-05 DIAGNOSIS — N1330 Unspecified hydronephrosis: Secondary | ICD-10-CM

## 2015-11-05 DIAGNOSIS — G4700 Insomnia, unspecified: Secondary | ICD-10-CM

## 2015-11-05 DIAGNOSIS — Z7982 Long term (current) use of aspirin: Secondary | ICD-10-CM

## 2015-11-05 DIAGNOSIS — Z87891 Personal history of nicotine dependence: Secondary | ICD-10-CM

## 2015-11-05 DIAGNOSIS — C548 Malignant neoplasm of overlapping sites of corpus uteri: Secondary | ICD-10-CM

## 2015-11-05 DIAGNOSIS — F329 Major depressive disorder, single episode, unspecified: Secondary | ICD-10-CM

## 2015-11-05 DIAGNOSIS — Z79899 Other long term (current) drug therapy: Secondary | ICD-10-CM

## 2015-11-05 DIAGNOSIS — E669 Obesity, unspecified: Secondary | ICD-10-CM

## 2015-11-05 DIAGNOSIS — C787 Secondary malignant neoplasm of liver and intrahepatic bile duct: Secondary | ICD-10-CM | POA: Diagnosis not present

## 2015-11-05 DIAGNOSIS — N3289 Other specified disorders of bladder: Secondary | ICD-10-CM

## 2015-11-05 DIAGNOSIS — Z5111 Encounter for antineoplastic chemotherapy: Secondary | ICD-10-CM | POA: Diagnosis not present

## 2015-11-05 DIAGNOSIS — M5136 Other intervertebral disc degeneration, lumbar region: Secondary | ICD-10-CM

## 2015-11-05 DIAGNOSIS — E119 Type 2 diabetes mellitus without complications: Secondary | ICD-10-CM

## 2015-11-05 DIAGNOSIS — G629 Polyneuropathy, unspecified: Secondary | ICD-10-CM

## 2015-11-05 DIAGNOSIS — R109 Unspecified abdominal pain: Secondary | ICD-10-CM

## 2015-11-05 DIAGNOSIS — I10 Essential (primary) hypertension: Secondary | ICD-10-CM

## 2015-11-05 DIAGNOSIS — R339 Retention of urine, unspecified: Secondary | ICD-10-CM

## 2015-11-05 DIAGNOSIS — M549 Dorsalgia, unspecified: Secondary | ICD-10-CM

## 2015-11-05 DIAGNOSIS — M21371 Foot drop, right foot: Secondary | ICD-10-CM

## 2015-11-05 DIAGNOSIS — Z9181 History of falling: Secondary | ICD-10-CM

## 2015-11-05 DIAGNOSIS — N2889 Other specified disorders of kidney and ureter: Secondary | ICD-10-CM

## 2015-11-05 NOTE — Progress Notes (Signed)
[facility] CONSULT NOTE  Patient Care Team: Ellamae Sia, MD as PCP - General (Internal Medicine) Clent Jacks, RN as Registered Nurse  CHIEF COMPLAINTS/PURPOSE OF CONSULTATION:  Oncology History   # July 2017 UTERINE PECOMA s/p Liver bx [Dr.Berhcuk]; Uterine mass; liver lesion;  lung nodules;  # July 28th 2017- TORISEL weekly  # 2002 & 2004 hip Surgery/chronic pain [Dr.Crsip]/Right foot drop- limited mobility/Obese    # [facility]- TSC-2 NOT DONE; NF-1 [mek-Inhibitors];MED/TP53**; TMB-Intermediate     Malignant neoplasm of overlapping sites of body of uterus ([place])   08/18/2015 Initial Diagnosis    Malignant neoplasm of overlapping sites of body of uterus ([place])       HISTORY OF PRESENTING ILLNESS:  Annette [name] 55 y.o.  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 She is currently status post 8 weekly treatments.  Patient is currently at rehabilitation; overall doing poorly. Patient continues to have significant abdominal pain; poor by mouth intake. Lost weight.   As per palliative care nurse evaluation at the rehabilitation; patient had significant mental status changes [not on pain medication at the time]. However today she is lucid.   She had a recent fall again. Complains of knee pain leg pain currently improved. No chest pain. Shortness of breath with exertion. She is wheelchair-bound.  ROS: A complete 10 point review of system is done which is negative except mentioned above in history of present illness  MEDICAL HISTORY:  Past Medical History:  Diagnosis Date  . Asthma   . Back pain   . Bladder absent    unable to urination- in hospital  . Cancer [facility])   . DDD (degenerative disc disease), lumbar   . Depression   . Diabetes mellitus without complication ([place])   . Hyperlipidemia   . Hypertension   . Insomnia   .  Menopausal syndrome   . Obesity   . Onychomycosis   . Peripheral neuropathy ([place])   . Renal insufficiency     SURGICAL HISTORY: Past Surgical History:  Procedure Laterality Date  . COSMETIC SURGERY    . JOINT REPLACEMENT Bilateral [id number]  . LIVER BIOPSY    . REDUCTION MAMMAPLASTY Bilateral 12/15/2013  . SHOULDER SURGERY Left     SOCIAL HISTORY: Phillip Heal; live by self; smoking; no alcohol. She has children/family support. Social History   Social History  . Marital status: Single    Spouse name: N/A  . Number of children: N/A  . Years of education: N/A   Occupational History  . Not on file.   Social History Main Topics  . Smoking status: Former Smoker    Types: Cigarettes    Quit date: 08/19/2015  . Smokeless tobacco: Never Used  . Alcohol use No  . Drug use: No  . Sexual activity: Not on file   Other Topics Concern  . Not on file   Social History Narrative  . No narrative on file    FAMILY HISTORY: Family History  Problem Relation Age of Onset  . COPD Mother   . Sleep apnea Mother   . Alcohol abuse Mother   . Depression Mother   . Stroke Mother   . Heart disease Father   . Heart disease Sister   . Bladder Cancer Neg Hx   . Kidney cancer Neg Hx     ALLERGIES:  is allergic  to iodides; iodine; shellfish allergy; shellfish-derived products; iodinated diagnostic agents; povidone iodine; and povidone-iodine.  MEDICATIONS:  Current Outpatient Prescriptions  Medication Sig Dispense Refill  . alprazolam (XANAX) 2 MG tablet Take 1 tablet (2 mg total) by mouth 2 (two) times daily as needed for anxiety. 30 tablet 0  . aspirin EC 81 MG tablet Take 81 mg by mouth every morning.     Marland Kitchen buPROPion (WELLBUTRIN SR) 150 MG 12 hr tablet Take 1 tablet (150 mg total) by mouth daily. 30 tablet 3  . gabapentin (NEURONTIN) 600 MG tablet Limit 1 tablet by mouth twice per day if tolerated 60 tablet 3  . HYDROmorphone (DILAUDID) 2 MG tablet Take 1 tablet (2 mg total) by mouth  every 3 (three) hours as needed for severe pain. 120 tablet 0  . lactulose (CHRONULAC) 10 GM/15ML solution Take 30 g by mouth 2 (two) times daily as needed for mild constipation or moderate constipation.     Marland Kitchen lisinopril (PRINIVIL,ZESTRIL) 10 MG tablet Take 10 mg by mouth every morning.     . megestrol (MEGACE) 40 MG tablet Take 1 tablet (40 mg total) by mouth daily.    . metFORMIN (GLUCOPHAGE-XR) 500 MG 24 hr tablet Take 500 mg by mouth daily with breakfast.    . oxybutynin (DITROPAN) 5 MG tablet Take 1 tablet (5 mg total) by mouth every 8 (eight) hours as needed for bladder spasms. 30 tablet 3  . polyethylene glycol (MIRALAX / GLYCOLAX) packet Take 17 g by mouth 2 (two) times daily. (Patient taking differently: Take 17 g by mouth 2 (two) times daily as needed for mild constipation or moderate constipation. ) 14 each 0  . Potassium Chloride ER 20 MEQ TBCR Take 1 tablet by mouth 2 (two) times daily. 60 tablet 6  . pravastatin (PRAVACHOL) 40 MG tablet Take 40 mg by mouth at bedtime.     . senna-docusate (SENOKOT-S) 8.6-50 MG tablet Take 2 tablets by mouth daily. 60 tablet 6  . silver sulfADIAZINE (SILVADENE) 1 % cream Apply topically daily. (Patient taking differently: Apply topically every other day. ) 50 g 3  . Tapentadol HCl (NUCYNTA ER) 200 MG TB12 Take 1 tablet by mouth every 12 (twelve) hours. 30 tablet 0  . tiZANidine (ZANAFLEX) 2 MG tablet Limit 1 tablet by mouth 2-4 times per day if tolerated 120 tablet 0  . traZODone (DESYREL) 150 MG tablet Take 300 mg by mouth at bedtime as needed for sleep.     . Vilazodone HCl (VIIBRYD) 20 MG TABS Take 1 tablet (20 mg total) by mouth daily. Takes with 86m for total of 658mdaily 30 tablet 3  . Vilazodone HCl (VIIBRYD) 40 MG TABS Take 1 tablet (40 mg total) by mouth daily. Takes with 2024mor total of 23m41mily 30 tablet 3  . beclomethasone (QVAR) 40 MCG/ACT inhaler Inhale into the lungs.     No current facility-administered medications for this visit.        .  PMarland KitchenYSICAL EXAMINATION: ECOG PERFORMANCE STATUS: 3 - Symptomatic, >50% confined to bed  Vitals:   11/05/15 0943  BP: 135/87  Pulse: (!) 109  Resp: (!) 21  Temp: 97.3 F (36.3 C)   Filed Weights   11/05/15 0943  Weight: 247 lb 4.8 oz (112.2 kg)    GENERAL: Well-nourished well-developed; Alert, Moderate distress complaining of worsening abdominal pain. She is in a wheelchair. Obese. Accompanied by caregiver/and friend EYES: no pallor or icterus OROPHARYNX: no thrush or ulceration;  NECK:  supple, no masses felt LYMPH:  no palpable lymphadenopathy in the cervical, axillary or inguinal regions LUNGS: clear to auscultation and  No wheeze or crackles HEART/CVS: regular rate & rhythm and no murmurs; No lower extremity edema ABDOMEN: abdomen soft, non-tender and normal bowel sounds Musculoskeletal:no cyanosis of digits and no clubbing  PSYCH: drowsy-but easily arousable & oriented x 3 NEURO: no focal motor/sensory deficits SKIN:  no rashes or significant lesions  LABORATORY DATA:  I have reviewed the data as listed Lab Results  Component Value Date   WBC 11.1 (H) 11/04/2015   HGB 9.4 (L) 11/04/2015   HCT 29.1 (L) 11/04/2015   MCV 82.0 11/04/2015   PLT 230 11/04/2015    Recent Labs  10/20/15 0726 10/22/15 0820 11/04/15 1200  NA 135 137 140  K 3.5 3.3* 3.5  CL 100* 102 107  CO2 _0 GLUCOSE 207* 186* 167*  BUN _1 CREATININE 0.71 0.77 0.87  CALCIUM 8.4* 8.6* 8.4*  GFRNONAA >60 >60 >60  GFRAA >60 >60 >60  PROT 7.3 7.4 6.9  ALBUMIN 2.9* 3.0* 2.5*  AST 36 24 35  ALT _2 ALKPHOS 107 104 155*  BILITOT 0.5 0.4 0.3    RADIOGRAPHIC STUDIES: I have personally reviewed the radiological images as listed and agreed with the findings in the report. Ct Abdomen Pelvis Wo Contrast  Result Date: 10/20/2015 CLINICAL DATA:  Recent diagnosis of gastric carcinoma, abdominal pain, also history of uterine carcinoma EXAM: CT ABDOMEN AND PELVIS WITHOUT  CONTRAST TECHNIQUE: Multidetector CT imaging of the abdomen and pelvis was performed following the standard protocol without IV contrast. COMPARISON:  CT abdomen pelvis of 09/26/2015 FINDINGS: Lower chest: There has been and increase in size and number of multiple pulmonary metastasis involving both lungs. The largest of these lesions abuts the right hemidiaphragm in the posterior right lower lobe on image 20 series 4 measuring 2.2 cm in diameter compared to 1.9 cm previously. Hepatobiliary: There has also been and increase in size and number of diffuse metastatic involvement of the liver. One of the larger lesions is within the left lobe image 27 series 2 measuring 4.2 cm compared to 3.1 cm previously. The gallbladder is contracted and no gallstones are seen. The previously questioned distal common bile duct calculus is not well seen with contrast obscuring a portion of that region. No ductal dilatation is seen however. Pancreas: The pancreas is normal in size and the pancreatic duct is not dilated. Spleen: The spleen is unremarkable. Adrenals/Urinary Tract: The adrenal glands are symmetrical and normal. Again bilateral hydronephrosis is present with dilated ureters into the pelvis. This appears to be due to the compression by a large lobular pelvic mass as previously described. The urinary bladder is is obscured by artifacts from bilateral hip replacements and cannot be evaluated. Stomach/Bowel: The stomach is moderately distended with oral contrast and air with no abnormality noted. No definite gastric lesion is evident by CT. No dilatation of small bowel is seen. The colon is not distended. The rectosigmoid colon is not well seen due to compression and/or invasion of the previous described multilobular midline pelvic mass. Vascular/Lymphatic: The abdominal aorta is normal in caliber. No adenopathy is seen. Reproductive: Lobular large midline pelvic mass again noted is which is calcified most likely due to multi  fibroid uterus. With a history of uterine carcinoma, obviously malignancy cannot be excluded on the present study which is performed without IV contrast media. Also, of much of the  lower pelvic anatomy is obscured by multiple artifacts created by bilateral hip replacements. Other: None Musculoskeletal: The lumbar vertebrae are in normal alignment. Intervertebral disc spaces appear normal. No lytic or blastic bony lesion is seen. Bilateral total hip replacements are noted. IMPRESSION: 1. Interval increase in size and number of pulmonary metastases. 2. Interval increase in size and number of multiple liver metastases diffusely. 3. Bilateral hydronephrosis appears to be due to compression of the ureters by the larger lobular pelvic mass described previously. 4. Large lobular pelvic mass some of which is calcified and most consistent with multi fibroid uterus. However on this unenhanced study, malignancy cannot be excluded. Electronically Signed   By: Ivar Drape M.D.   On: 10/20/2015 10:22    ASSESSMENT & PLAN:   Malignant neoplasm of overlapping sites of body of uterus [facility]) Metastatic malignancy of the uterus-PECOMA involving the liver; lung. Stage IV- on palliative treatments with Torisel  s/p 4 treatments; Most recent scan October 20 2015 shows--progress the liver lesions lung lesions.   # Patient is currently status post 8 treatments- with progressive lung and liver lesions.  # Declining performance status-performance status 3-4; poor pain control.   # I do not think patient is a candidate for any further palliative chemotherapy. Given the higher risk than benefit of palliative chemotherapy- hospice is recommended Long discussion the patient and family regarding overall poor prognosis. Recommend admission to hospice home for pain control.   # Discussed the above plan at length with the patient and family in detail. They agree. I also spoke to Dr. Sandi Mealy primary care doctor- agrees with  the plan. Spoke to palliative care nurse- we will arrange for hospice home admission. No follow-ups needed here.  # Pain control deferred to hospice home- management at this time   I reviewed the images myself and with the patient and family in detail.   # 40 minutes face-to-face with the patient discussing the above plan of care; more than 50% of time spent on prognosis/ natural history; counseling and coordination.     Cammie Sickle, MD 11/05/2015 1:23 PM

## 2015-11-05 NOTE — Progress Notes (Signed)
Pt complains of a shallow pain in her chest recently that comes and goes. Pain 10/10 in her right knee to foot that started when she got here.  Recent falls [facility] facility [facility] fall bractlet in place. Pt encouraged to ask for assistance e and family is at her side today. Pt does not remember all the events that took place in infusion last time.

## 2015-11-05 NOTE — Progress Notes (Signed)
RN present with MD discussion on treatment plan discussion.

## 2015-11-05 NOTE — Assessment & Plan Note (Addendum)
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 Stage IV- on palliative treatments with Torisel  s/p 4 treatments; Most recent scan October 20 2015 shows--progress the liver lesions lung lesions.   # Patient is currently status post 8 treatments- with progressive lung and liver lesions.  # Declining performance status-performance status 3-4; poor pain control.   # I do not think patient is a candidate for any further palliative chemotherapy. Given the higher risk than benefit of palliative chemotherapy- hospice is recommended Long discussion the patient and family regarding overall poor prognosis. Recommend admission to hospice home for pain control.   # Discussed the above plan at length with the patient and family in detail. They agree. I also spoke to Dr. Sandi Mealy primary care doctor- agrees with the plan. Spoke to palliative care nurse- we will arrange for hospice home admission. No follow-ups needed here.  # Pain control deferred to hospice home- management at this time   I reviewed the images myself and with the patient and family in detail.   # 40 minutes face-to-face with the patient discussing the above plan of care; more than 50% of time spent on prognosis/ natural history; counseling and coordination.

## 2015-11-10 ENCOUNTER — Ambulatory Visit: Payer: Medicare Other | Admitting: Urology

## 2015-11-30 ENCOUNTER — Encounter: Payer: Medicare Other | Admitting: Pain Medicine

## 2015-12-08 DEATH — deceased

## 2016-01-13 NOTE — Progress Notes (Signed)
[facility] CONSULT NOTE  Patient Care Team: Ellamae Sia, MD as PCP - General (Internal Medicine) Clent Jacks, RN as Registered Nurse  CHIEF COMPLAINTS/PURPOSE OF CONSULTATION:  Oncology History   # July 2017 UTERINE PECOMA s/p Liver bx [Dr.Berhcuk]; Uterine mass; liver lesion;  lung nodules;  # July 28th 2017- TORISEL weekly  # 2002 & 2004 hip Surgery/chronic pain [Dr.Crsip]/Right foot drop- limited mobility/Obese    # [facility]- TSC-2 NOT DONE; NF-1 [mek-Inhibitors];MED/TP53**; TMB-Intermediate     Malignant neoplasm of overlapping sites of body of uterus ([place])   08/18/2015 Initial Diagnosis    Malignant neoplasm of overlapping sites of body of uterus ([place])       HISTORY OF PRESENTING ILLNESS:  Annette [name] 55 y.o.  female with history of chronic pain  In the recent diagnosis of PECOMA of the uterusmetastatic to liver and lung- Patient currently on Torisel status post 2 treatments. He tolerated the treatments okay however worsening pain noted  Complaints of significant constipation. Patient is chronic back pain- she follows up with pain clinic. Denies any unusual shortness of breath or chest pain. Patient has chronic mild tingling and numbness of extremities. No nausea no vomiting. No chest pain. Complains of dysuria. No fevers. Patient notes to have a rash because of heating pad use for pain.  ROS: A complete 10 point review of system is done which is negative except mentioned above in history of present illness  MEDICAL HISTORY:  Past Medical History:  Diagnosis Date  . Asthma   . Back pain   . Bladder absent    unable to urination- in hospital  . Cancer [facility])   . DDD (degenerative disc disease), lumbar   . Depression   . Diabetes mellitus without complication ([place])   . Hyperlipidemia   . Hypertension   . Insomnia   . Menopausal syndrome   . Obesity   . Onychomycosis   . Peripheral neuropathy ([place])   . Renal insufficiency      SURGICAL HISTORY: Past Surgical History:  Procedure Laterality Date  . COSMETIC SURGERY    . JOINT REPLACEMENT Bilateral [id number]  . LIVER BIOPSY    . REDUCTION MAMMAPLASTY Bilateral 12/15/2013  . SHOULDER SURGERY Left     SOCIAL HISTORY: Phillip Heal; live by self; smoking; no alcohol. She has children/family support. Social History   Social History  . Marital status: Single    Spouse name: N/A  . Number of children: N/A  . Years of education: N/A   Occupational History  . Not on file.   Social History Main Topics  . Smoking status: Former Smoker    Types: Cigarettes    Quit date: 08/19/2015  . Smokeless tobacco: Never Used  . Alcohol use No  . Drug use: No  . Sexual activity: Not on file   Other Topics Concern  . Not on file   Social History Narrative  . No narrative on file    FAMILY HISTORY: Family History  Problem Relation Age of Onset  . COPD Mother   . Sleep apnea Mother   . Alcohol abuse Mother   . Depression Mother   . Stroke Mother   . Heart disease Father   . Heart disease Sister   . Bladder Cancer Neg Hx   . Kidney cancer Neg Hx     ALLERGIES:  is allergic to iodides; iodine; shellfish allergy; shellfish-derived products; iodinated diagnostic agents; povidone iodine; and povidone-iodine.  MEDICATIONS:  Current Outpatient  Prescriptions  Medication Sig Dispense Refill  . aspirin EC 81 MG tablet Take 81 mg by mouth every morning.     . lactulose (CHRONULAC) 10 GM/15ML solution Take 30 g by mouth 2 (two) times daily as needed for mild constipation or moderate constipation.     Marland Kitchen lisinopril (PRINIVIL,ZESTRIL) 10 MG tablet Take 10 mg by mouth every morning.     . metFORMIN (GLUCOPHAGE-XR) 500 MG 24 hr tablet Take 500 mg by mouth daily with breakfast.    . pravastatin (PRAVACHOL) 40 MG tablet Take 40 mg by mouth at bedtime.     Marland Kitchen tiZANidine (ZANAFLEX) 2 MG tablet Limit 1 tablet by mouth 2-4 times per day if tolerated 120 tablet 0  . traZODone  (DESYREL) 150 MG tablet Take 300 mg by mouth at bedtime as needed for sleep.     Marland Kitchen alprazolam (XANAX) 2 MG tablet Take 1 tablet (2 mg total) by mouth 2 (two) times daily as needed for anxiety. 30 tablet 0  . beclomethasone (QVAR) 40 MCG/ACT inhaler Inhale into the lungs.    Marland Kitchen buPROPion (WELLBUTRIN SR) 150 MG 12 hr tablet Take 1 tablet (150 mg total) by mouth daily. 30 tablet 3  . gabapentin (NEURONTIN) 600 MG tablet Limit 1 tablet by mouth twice per day if tolerated 60 tablet 3  . HYDROmorphone (DILAUDID) 2 MG tablet Take 1 tablet (2 mg total) by mouth every 3 (three) hours as needed for severe pain. 120 tablet 0  . megestrol (MEGACE) 40 MG tablet Take 1 tablet (40 mg total) by mouth daily.    Marland Kitchen oxybutynin (DITROPAN) 5 MG tablet Take 1 tablet (5 mg total) by mouth every 8 (eight) hours as needed for bladder spasms. 30 tablet 3  . polyethylene glycol (MIRALAX / GLYCOLAX) packet Take 17 g by mouth 2 (two) times daily. (Patient taking differently: Take 17 g by mouth 2 (two) times daily as needed for mild constipation or moderate constipation. ) 14 each 0  . Potassium Chloride ER 20 MEQ TBCR Take 1 tablet by mouth 2 (two) times daily. 60 tablet 6  . senna-docusate (SENOKOT-S) 8.6-50 MG tablet Take 2 tablets by mouth daily. 60 tablet 6  . silver sulfADIAZINE (SILVADENE) 1 % cream Apply topically daily. (Patient taking differently: Apply topically every other day. ) 50 g 3  . Tapentadol HCl (NUCYNTA ER) 200 MG TB12 Take 1 tablet by mouth every 12 (twelve) hours. 30 tablet 0  . Vilazodone HCl (VIIBRYD) 20 MG TABS Take 1 tablet (20 mg total) by mouth daily. Takes with 56m for total of 673mdaily 30 tablet 3  . Vilazodone HCl (VIIBRYD) 40 MG TABS Take 1 tablet (40 mg total) by mouth daily. Takes with 2052mor total of 73m33mily 30 tablet 3   No current facility-administered medications for this visit.       .  PMarland KitchenYSICAL EXAMINATION: ECOG PERFORMANCE STATUS: 2 - Symptomatic, <50% confined to  bed  Vitals:   09/17/15 0919  BP: 127/88  Pulse: 91  Resp: 18  Temp: 98.1 F (36.7 C)   Filed Weights   09/17/15 0919  Weight: 278 lb 7 oz (126.3 kg)    GENERAL: Well-nourished well-developed; Alert, no distress and comfortable. Accompanied by sister. She is walking by herself; Obese.  EYES: no pallor or icterus OROPHARYNX: no thrush or ulceration;  NECK: supple, no masses felt LYMPH:  no palpable lymphadenopathy in the cervical, axillary or inguinal regions LUNGS: clear to auscultation and  No wheeze  or crackles HEART/CVS: regular rate & rhythm and no murmurs; No lower extremity edema ABDOMEN: abdomen soft, non-tender and normal bowel sounds Musculoskeletal:no cyanosis of digits and no clubbing  PSYCH: drowsy-but easily arousable & oriented x 3 NEURO: no focal motor/sensory deficits SKIN:  no rashes or significant lesions  LABORATORY DATA:  I have reviewed the data as listed Lab Results  Component Value Date   WBC 11.1 (H) 11/04/2015   HGB 9.4 (L) 11/04/2015   HCT 29.1 (L) 11/04/2015   MCV 82.0 11/04/2015   PLT 230 11/04/2015    Recent Labs  10/20/15 0726 10/22/15 0820 11/04/15 1200  NA 135 137 140  K 3.5 3.3* 3.5  CL 100* 102 107  CO2 _0 GLUCOSE 207* 186* 167*  BUN _1 CREATININE 0.71 0.77 0.87  CALCIUM 8.4* 8.6* 8.4*  GFRNONAA >60 >60 >60  GFRAA >60 >60 >60  PROT 7.3 7.4 6.9  ALBUMIN 2.9* 3.0* 2.5*  AST 36 24 35  ALT _2 ALKPHOS 107 104 155*  BILITOT 0.5 0.4 0.3    RADIOGRAPHIC STUDIES: I have personally reviewed the radiological images as listed and agreed with the findings in the report. No results found.  ASSESSMENT & PLAN:   Malignant neoplasm of overlapping sites of body of uterus [facility]) Metastatic malignancy of the uterus-PECOMA involving the liver; lung. Stage IV- reviewed with the patient and family at length.  She understands  That the treatments or palliative. Also discussed with Dr.Berchuck.   #  I recommend the use  of Torisel  Weekly- s/p 2 treatments; proceed with # 3 today. Tolerating well. Labs are okay. No major side effects noted. We will plan to get a CT scan after 4-6 treatments.  # Dysuria- recommend UA.   # Chronic pain- currently managed by Dr.Crisp-  Continue pain management at this time.  # Hydronephrosis on the CT scan recommend- evaluation with Dr. Erlene Quan.  # skin ulceration- sec to heating pad- improving. Nepsorin/ silavdene  #  Patient will follow-up with me with labs treatment in 2w;  Follow-up next week for lab/ treatment.   # 25 minutes face-to-face with the patient discussing the above plan of care; more than 50% of time spent on prognosis/ natural history; counseling and coordination.     Cammie Sickle, MD 01/13/2016 3:13 PM

## 2017-07-14 IMAGING — CR DG CHEST 2V
1 series · 2 of 2 positions shown · non-contrast
Comparison: Chest x-ray dated 12/07/2011.

CLINICAL DATA: Pain under right breast and in right arm with
shortness of breath beginning this morning.

EXAM:
CHEST  2 VIEW

[Series 1: dg chest 2 view · 0.14mm/px · 2 of 2 slices shown]
[im 1/2]
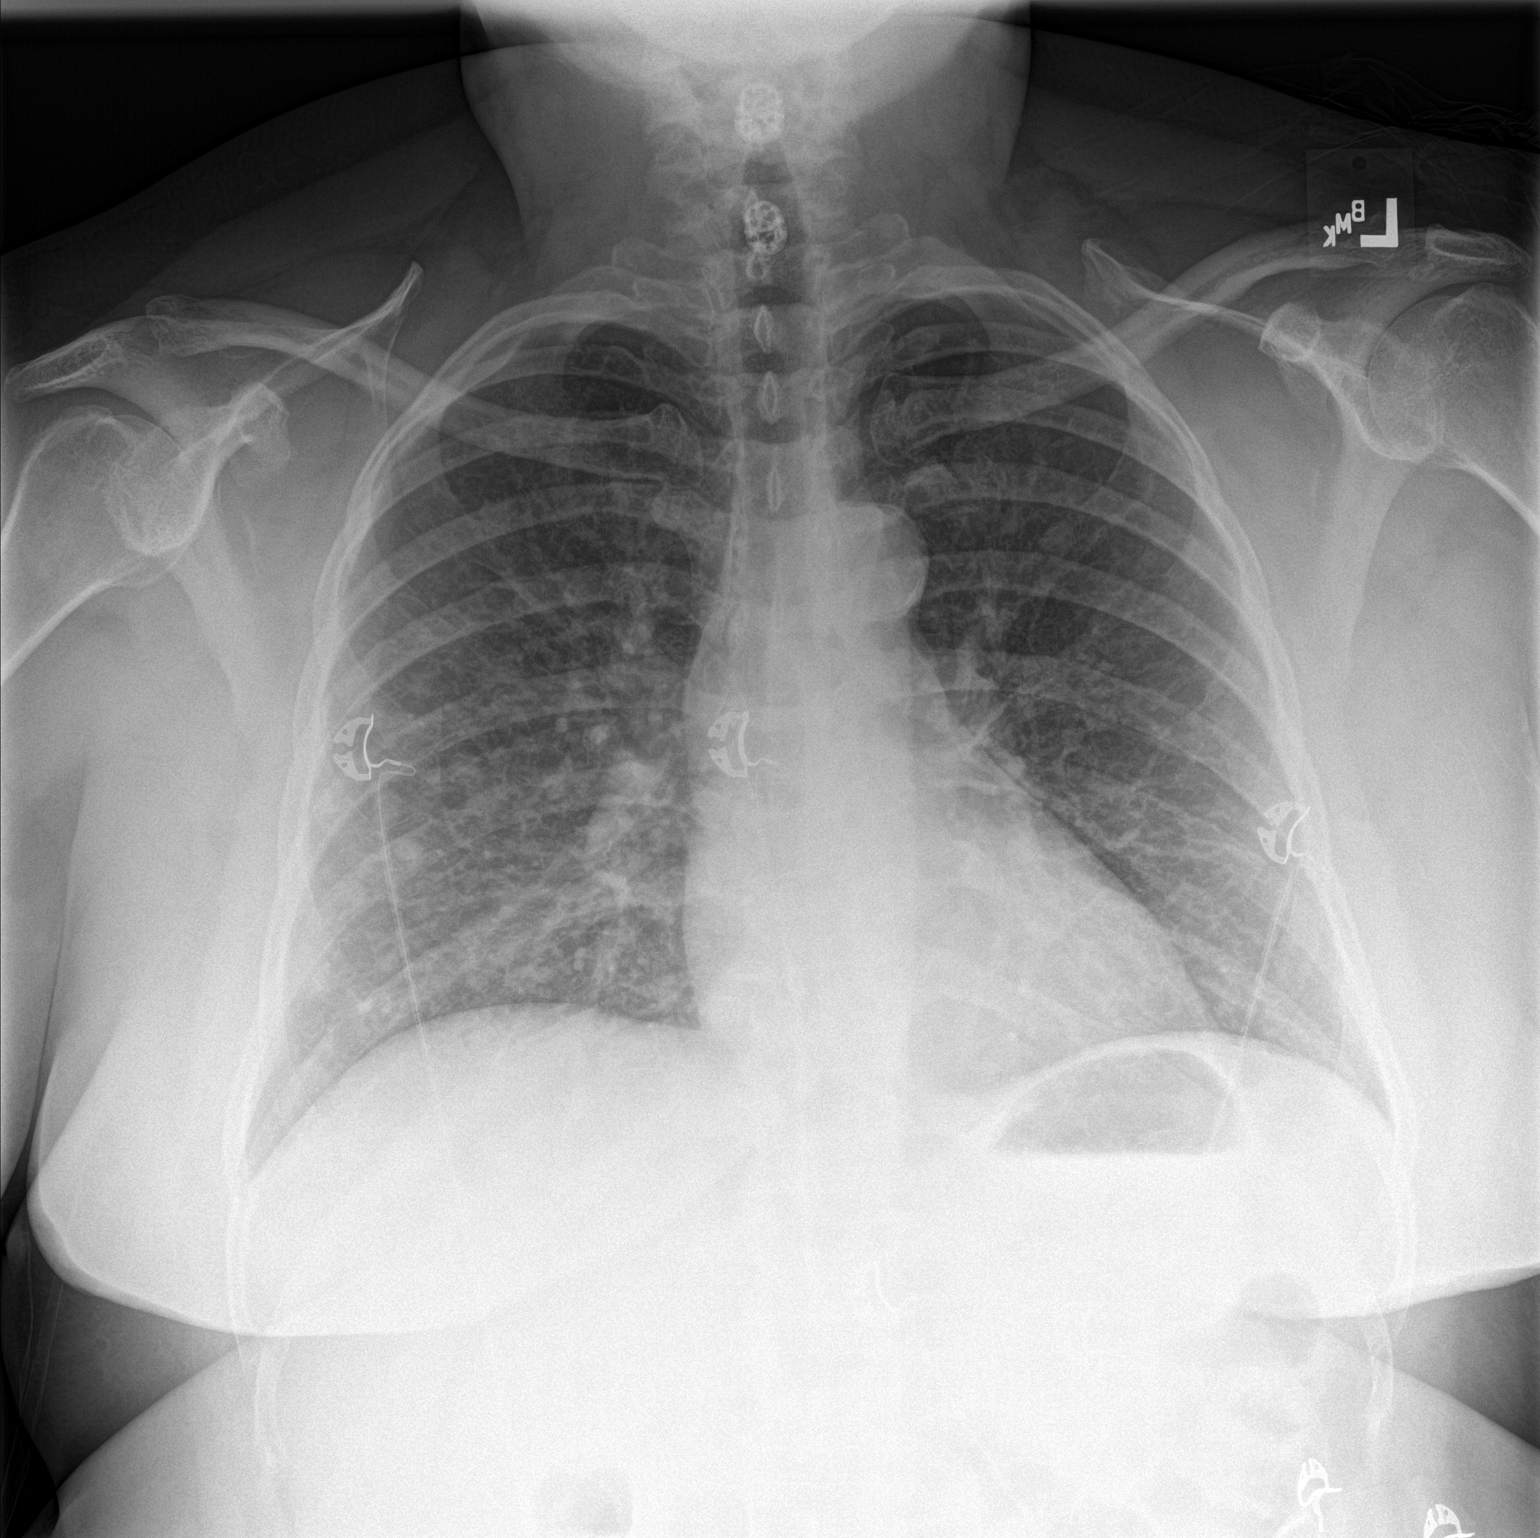
[im 2/2]
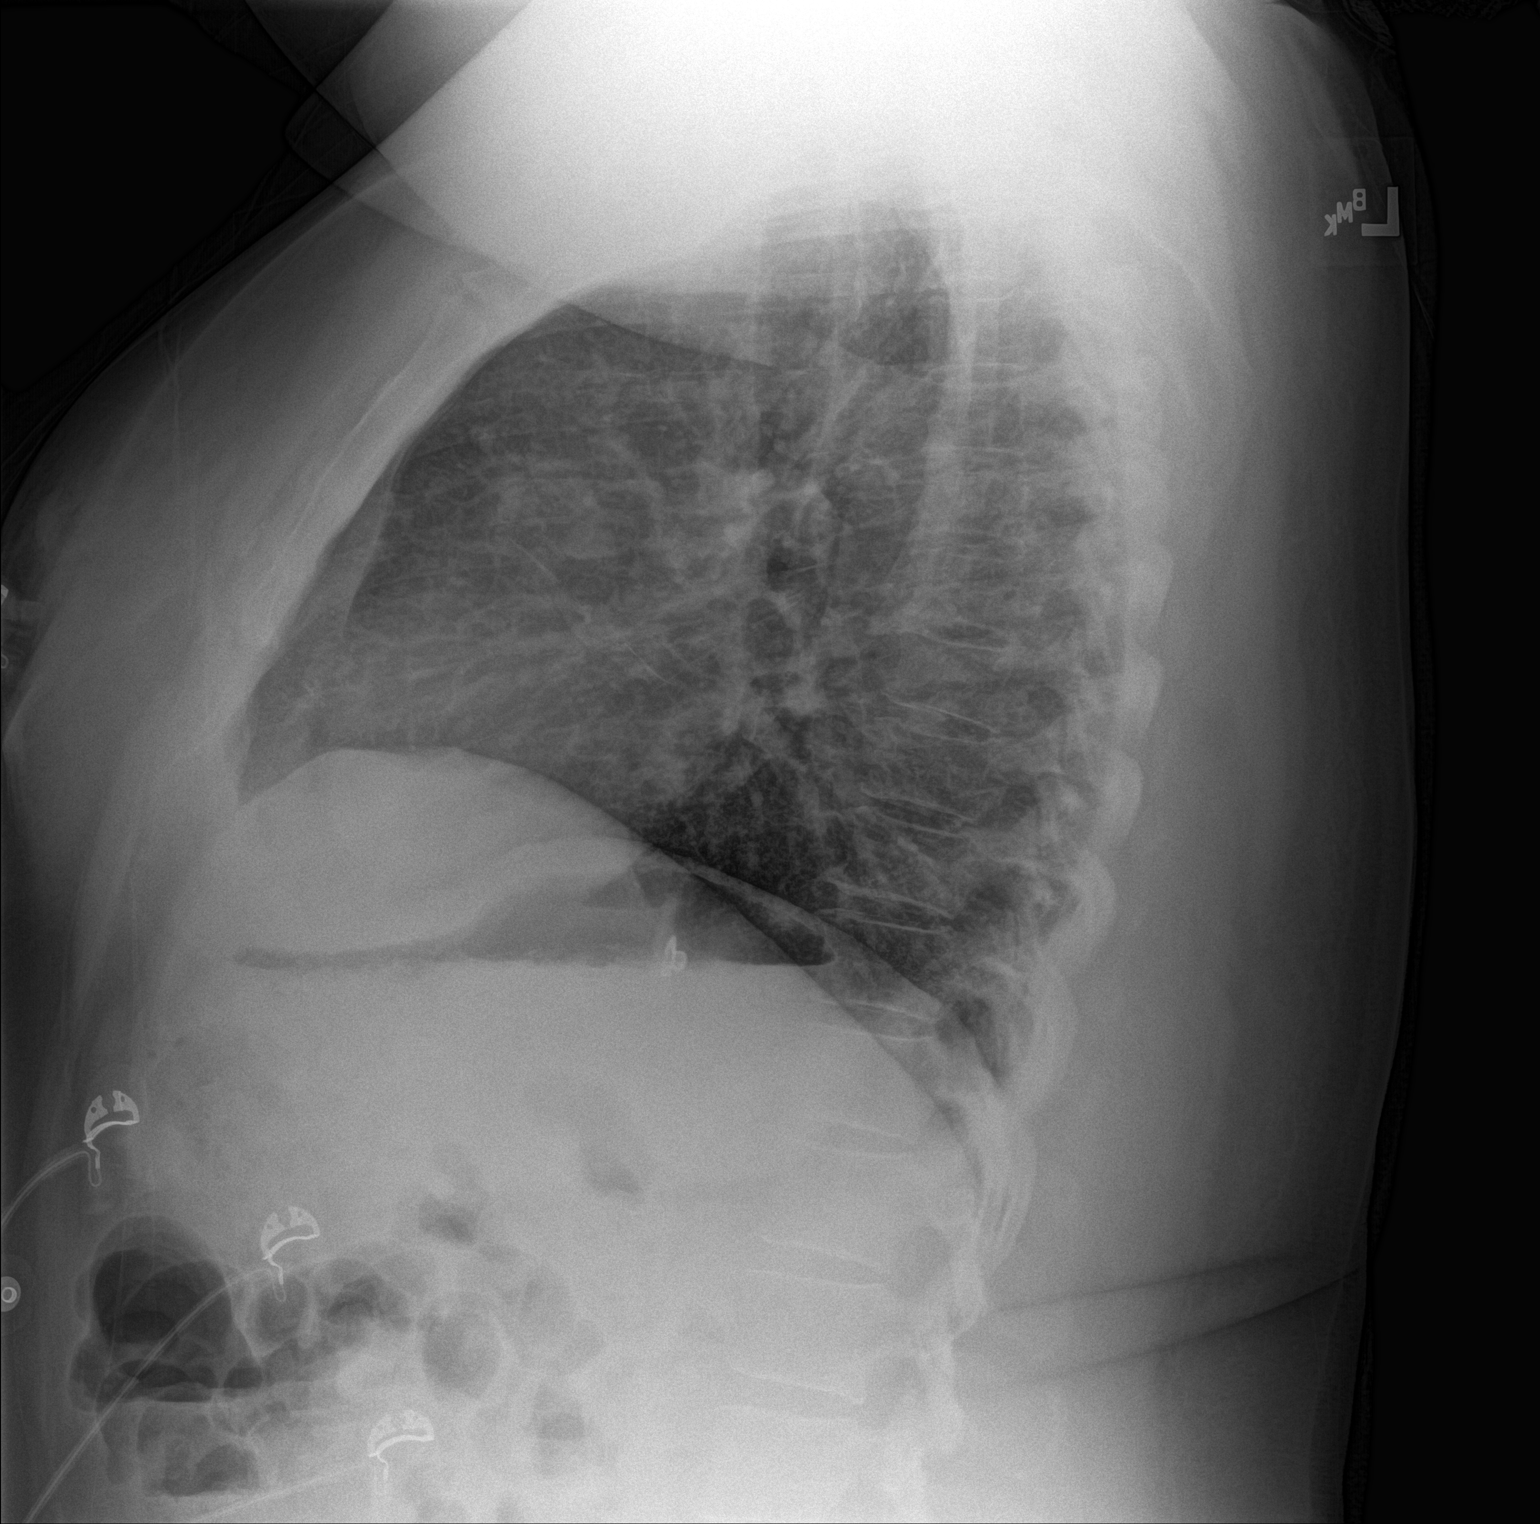

[2 of 2 positions shown; findings below may reference images not displayed]

FINDINGS: Heart size is normal. Atherosclerotic changes noted at the aortic
arch. Ill-defined nodularity is seen within the right lower lung,
largest within the lateral aspects of the right lower lung. Left
lung is relatively clear. No pleural effusion or pneumothorax seen.
Osseous structures about the chest are unremarkable.
IMPRESSION: Probable small pulmonary nodules within the right lower lung,
perhaps with surrounding interstitial edema. Recommend chest CT for
further characterization.

Aortic atherosclerosis.

## 2017-07-14 IMAGING — CT CT ABD-PELV W/O CM
2 of 4 series · 16 of 46 positions shown, 18 images · non-contrast
Comparison: 07/23/2015

CLINICAL DATA: Upper and lower abdominal pain. Recently diagnosed
uterine cancer.

EXAM:
CT ABDOMEN AND PELVIS WITHOUT CONTRAST
TECHNIQUE: Multidetector CT imaging of the abdomen and pelvis was performed
following the standard protocol without IV contrast.

[Series 2: routine abd pel wo · axial · 0.85mm/px · z∈[-1220,-756]mm · 13 of 103 slices shown, 15 images]
[im 5/103  soft-tissue]
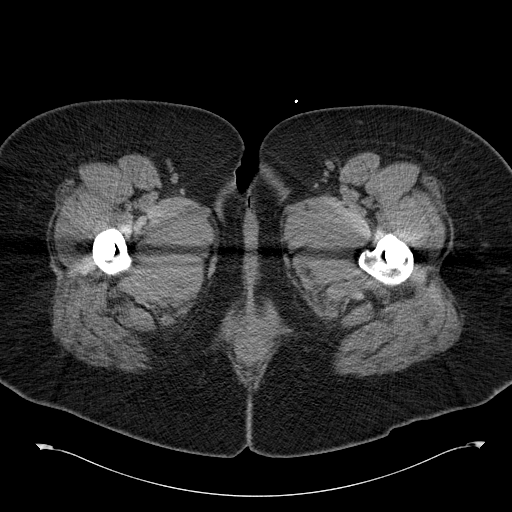
[im 5/103  bone]
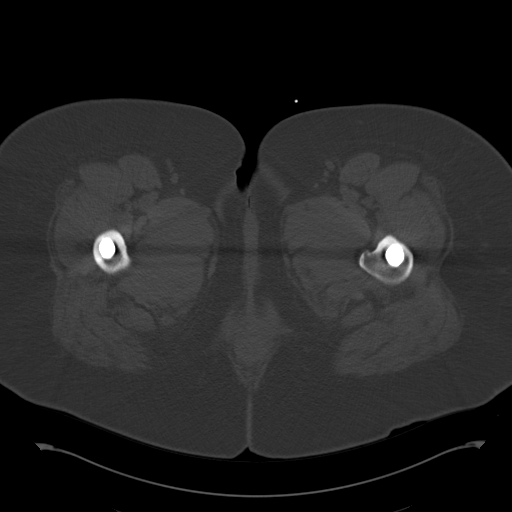
[im 13/103  soft-tissue]
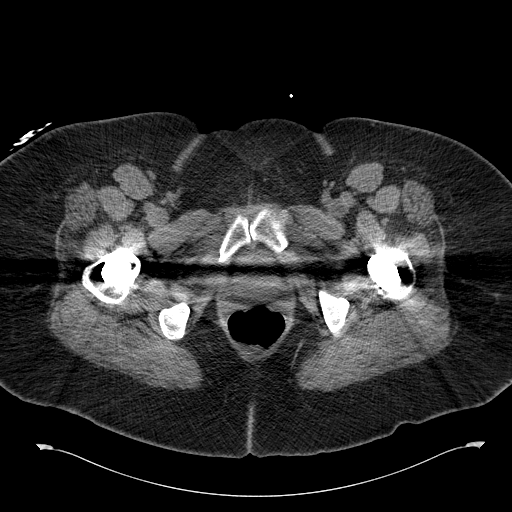
[im 22/103  soft-tissue]
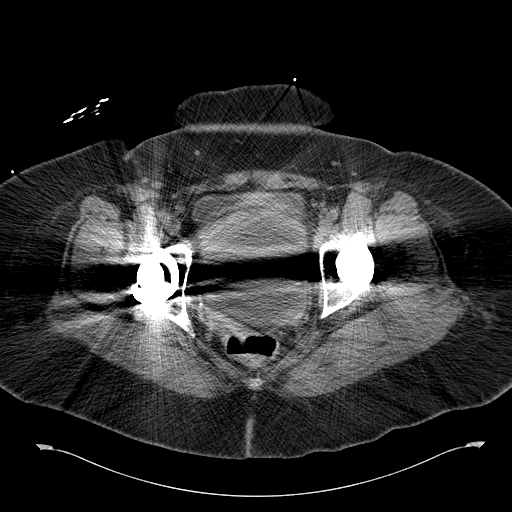
[im 30/103  soft-tissue]
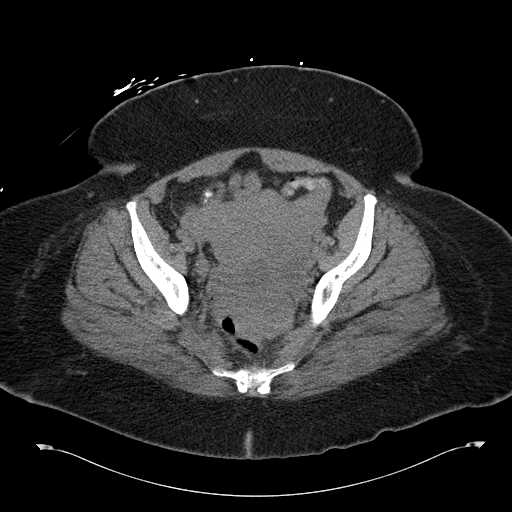
[im 35/103  soft-tissue]
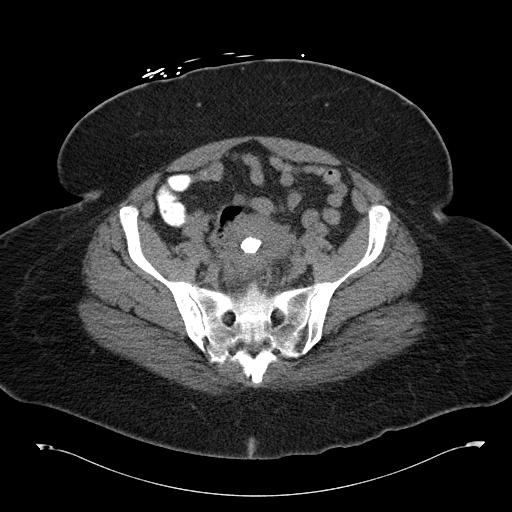
[im 43/103  soft-tissue]
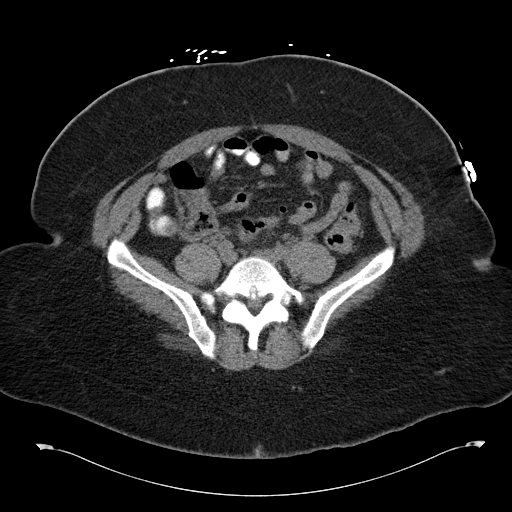
[im 52/103  soft-tissue]
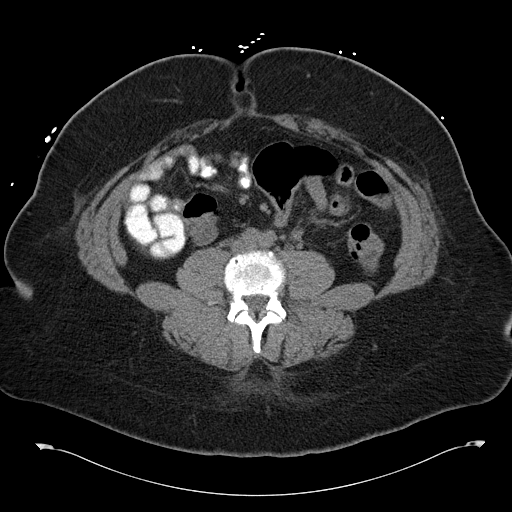
[im 60/103  soft-tissue]
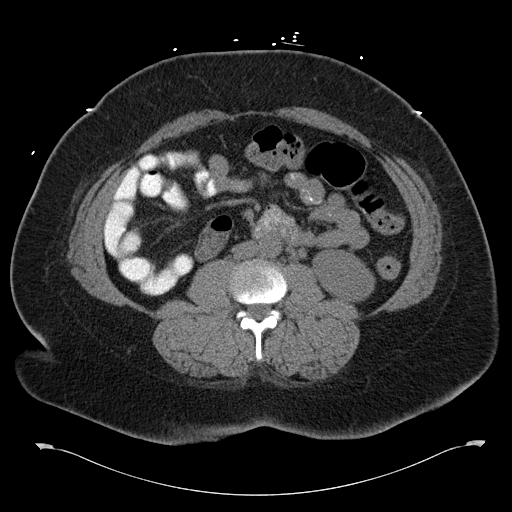
[im 69/103  soft-tissue]
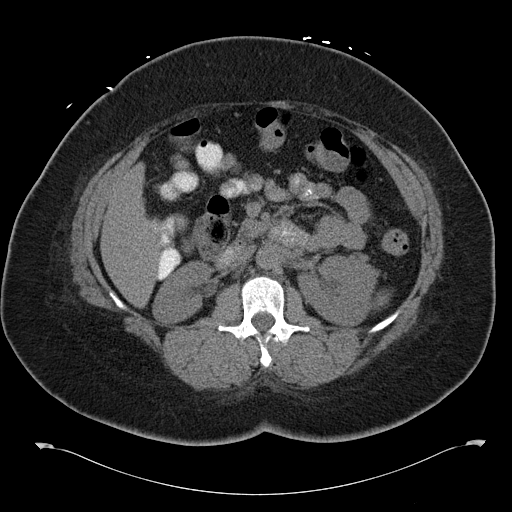
[im 69/103  bone]
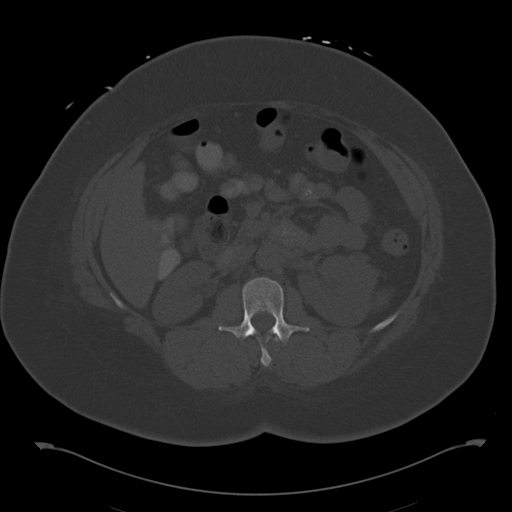
[im 73/103  soft-tissue]
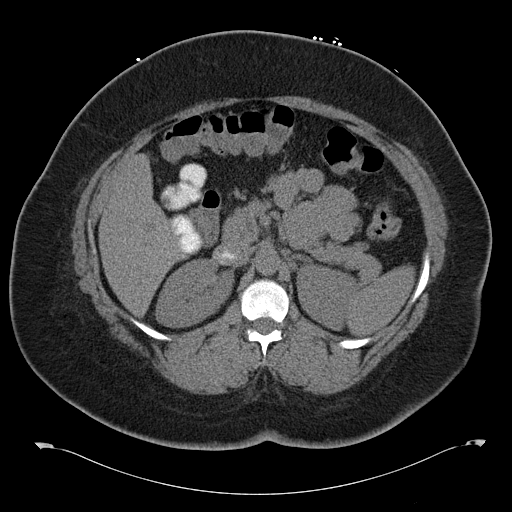
[im 81/103  soft-tissue]
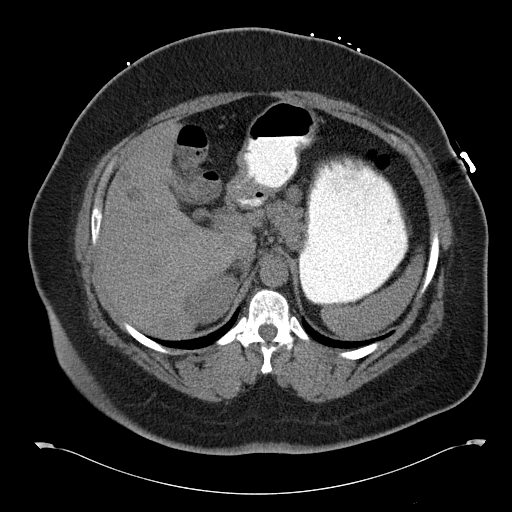
[im 90/103  soft-tissue]
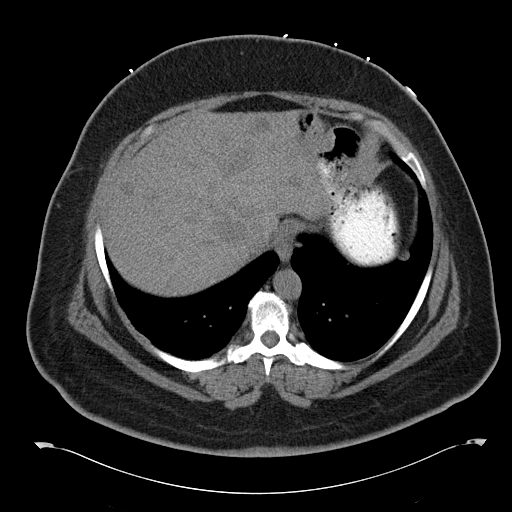
[im 98/103  soft-tissue]
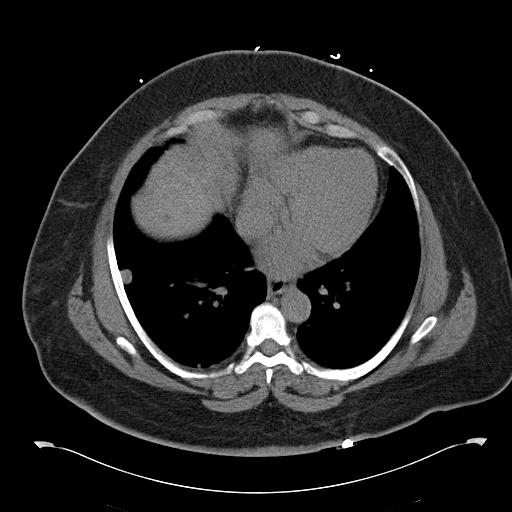

[Series 5: cor routine abd pel wo · coronal · 0.86mm/px · 3 of 184 slices shown]
[im 62/184  soft-tissue]
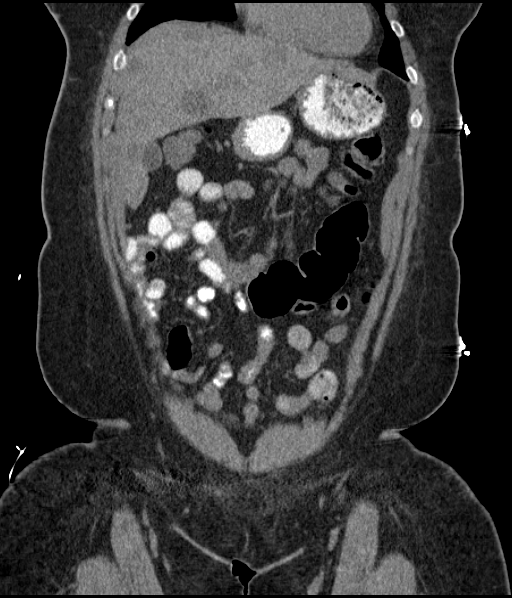
[im 82/184  soft-tissue]
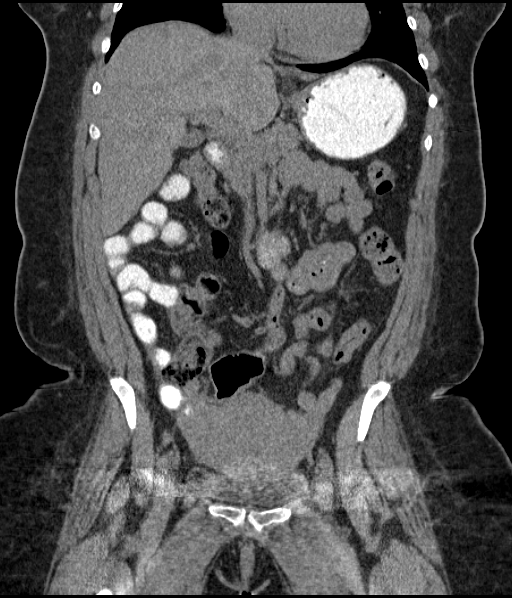
[im 102/184  soft-tissue]
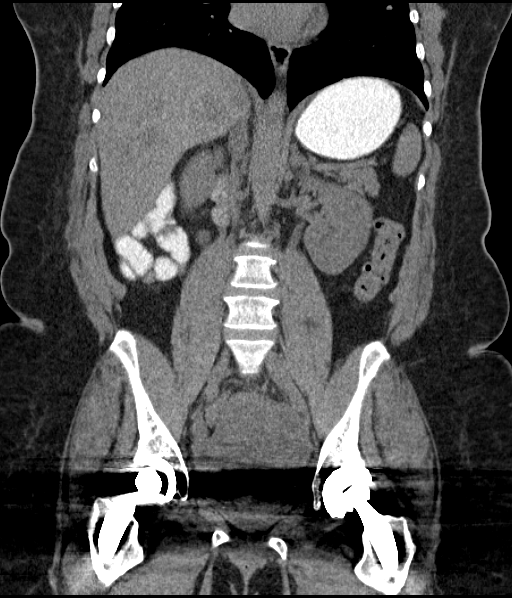

[16 of 46 positions shown; findings below may reference images not displayed]

FINDINGS: Lower chest: Numerous nodules are again seen in the visualized lung
bases and have increased in size, with the largest measuring 18 mm
in the right lower lobe (series 4, image 18, previously 13 mm).
There is no pleural effusion.

Hepatobiliary: The numerous hypoattenuating liver lesions which have
increased in size and number from the prior study, the largest
measuring 3.1 cm in segment IV (series 2, image 18, previously
cm). Gallbladder is unremarkable. No biliary dilatation.

Pancreas: Unremarkable.

Spleen: Unremarkable.

Adrenals/Urinary Tract: Unremarkable adrenal glands and right
kidney. Slight fullness of the left renal collecting system. No
significant ureteral dilatation. Bladder is decompressed.

Stomach/Bowel: Oral contrast is present in the stomach and multiple
loops of nondilated proximal to mid small bowel. There is no
evidence of bowel obstruction. The appendix is unremarkable.

Vascular/Lymphatic: Normal caliber of the abdominal aorta. Rounded
soft tissue posteriorly in the left aspect of the pelvis measures
3.2 x 3.1 cm (series 2, image 73, previously 2.3 x 3.0 cm).

Reproductive: Suspected calcified fibroid in the posterior superior
uterine segment is unchanged. Masslike enlargement of the uterus
more inferiorly is grossly similar to the prior study, with
assessment partially limited by streak artifact from bilateral hip
arthroplasties.

Other: No free fluid.

Musculoskeletal: Bilateral hip arthroplasties. No suspicious lytic
or blastic osseous lesion identified.
IMPRESSION: 1. Interval progression of lung and liver metastases.
2. Enlargement of left pelvic nodal metastasis/ peritoneal implant.
3. Grossly similar appearance of infiltrative lower uterine mass.
